# Patient Record
Sex: Female | Born: 1960 | Race: White | Hispanic: No | Smoking: Never smoker
Health system: Southern US, Community
[De-identification: ages and names within clinical notes are randomized; demographics above are authoritative.]

## PROBLEM LIST (undated history)

## (undated) DIAGNOSIS — Z8669 Personal history of other diseases of the nervous system and sense organs: Secondary | ICD-10-CM

## (undated) DIAGNOSIS — M549 Dorsalgia, unspecified: Secondary | ICD-10-CM

## (undated) DIAGNOSIS — R51 Headache: Secondary | ICD-10-CM

## (undated) DIAGNOSIS — K219 Gastro-esophageal reflux disease without esophagitis: Secondary | ICD-10-CM

## (undated) DIAGNOSIS — J45909 Unspecified asthma, uncomplicated: Secondary | ICD-10-CM

## (undated) DIAGNOSIS — E539 Vitamin B deficiency, unspecified: Secondary | ICD-10-CM

## (undated) DIAGNOSIS — D509 Iron deficiency anemia, unspecified: Secondary | ICD-10-CM

## (undated) DIAGNOSIS — T7840XA Allergy, unspecified, initial encounter: Secondary | ICD-10-CM

## (undated) DIAGNOSIS — D3A020 Benign carcinoid tumor of the appendix: Secondary | ICD-10-CM

## (undated) DIAGNOSIS — T8859XA Other complications of anesthesia, initial encounter: Secondary | ICD-10-CM

## (undated) DIAGNOSIS — M1990 Unspecified osteoarthritis, unspecified site: Secondary | ICD-10-CM

## (undated) DIAGNOSIS — I10 Essential (primary) hypertension: Secondary | ICD-10-CM

## (undated) DIAGNOSIS — T4145XA Adverse effect of unspecified anesthetic, initial encounter: Secondary | ICD-10-CM

## (undated) DIAGNOSIS — K589 Irritable bowel syndrome without diarrhea: Secondary | ICD-10-CM

## (undated) DIAGNOSIS — F419 Anxiety disorder, unspecified: Secondary | ICD-10-CM

## (undated) DIAGNOSIS — G4700 Insomnia, unspecified: Secondary | ICD-10-CM

## (undated) DIAGNOSIS — Z5189 Encounter for other specified aftercare: Secondary | ICD-10-CM

## (undated) DIAGNOSIS — IMO0002 Reserved for concepts with insufficient information to code with codable children: Secondary | ICD-10-CM

## (undated) DIAGNOSIS — F32A Depression, unspecified: Secondary | ICD-10-CM

## (undated) DIAGNOSIS — J449 Chronic obstructive pulmonary disease, unspecified: Secondary | ICD-10-CM

## (undated) DIAGNOSIS — F329 Major depressive disorder, single episode, unspecified: Secondary | ICD-10-CM

## (undated) DIAGNOSIS — Z87442 Personal history of urinary calculi: Secondary | ICD-10-CM

## (undated) DIAGNOSIS — C801 Malignant (primary) neoplasm, unspecified: Secondary | ICD-10-CM

## (undated) DIAGNOSIS — I499 Cardiac arrhythmia, unspecified: Secondary | ICD-10-CM

## (undated) DIAGNOSIS — R519 Headache, unspecified: Secondary | ICD-10-CM

## (undated) HISTORY — PX: SMALL INTESTINE SURGERY: SHX150

## (undated) HISTORY — DX: Dorsalgia, unspecified: M54.9

## (undated) HISTORY — PX: TONSILLECTOMY AND ADENOIDECTOMY: SUR1326

## (undated) HISTORY — PX: JOINT REPLACEMENT: SHX530

## (undated) HISTORY — DX: Malignant (primary) neoplasm, unspecified: C80.1

## (undated) HISTORY — DX: Reserved for concepts with insufficient information to code with codable children: IMO0002

## (undated) HISTORY — PX: CHOLECYSTECTOMY: SHX55

## (undated) HISTORY — PX: RIGHT OOPHORECTOMY: SHX2359

## (undated) HISTORY — DX: Benign carcinoid tumor of the appendix: D3A.020

## (undated) HISTORY — PX: OTHER SURGICAL HISTORY: SHX169

## (undated) HISTORY — DX: Essential (primary) hypertension: I10

## (undated) HISTORY — PX: COLON SURGERY: SHX602

## (undated) HISTORY — PX: ABDOMINAL HYSTERECTOMY: SHX81

## (undated) HISTORY — DX: Unspecified asthma, uncomplicated: J45.909

## (undated) HISTORY — DX: Gastro-esophageal reflux disease without esophagitis: K21.9

## (undated) HISTORY — DX: Chronic obstructive pulmonary disease, unspecified: J44.9

## (undated) HISTORY — PX: HERNIA REPAIR: SHX51

## (undated) HISTORY — DX: Personal history of other diseases of the nervous system and sense organs: Z86.69

## (undated) HISTORY — DX: Iron deficiency anemia, unspecified: D50.9

## (undated) HISTORY — DX: Allergy, unspecified, initial encounter: T78.40XA

## (undated) HISTORY — PX: CYSTOSCOPY: SUR368

## (undated) HISTORY — PX: BREAST BIOPSY: SHX20

## (undated) HISTORY — DX: Encounter for other specified aftercare: Z51.89

## (undated) HISTORY — DX: Vitamin B deficiency, unspecified: E53.9

---

## 1999-11-07 HISTORY — PX: APPENDECTOMY: SHX54

## 1999-11-07 HISTORY — PX: GASTRIC BYPASS: SHX52

## 2004-11-06 HISTORY — PX: REPLACEMENT TOTAL KNEE: SUR1224

## 2006-06-25 ENCOUNTER — Ambulatory Visit (HOSPITAL_COMMUNITY): Payer: Self-pay | Admitting: Psychology

## 2006-07-10 ENCOUNTER — Ambulatory Visit (HOSPITAL_COMMUNITY): Payer: Self-pay | Admitting: Psychology

## 2006-11-02 ENCOUNTER — Ambulatory Visit: Payer: Self-pay | Admitting: Oncology

## 2006-11-06 HISTORY — PX: COLONOSCOPY WITH ESOPHAGOGASTRODUODENOSCOPY (EGD): SHX5779

## 2006-12-03 LAB — IRON AND TIBC
%SAT: 4 % — ABNORMAL LOW (ref 20–55)
Iron: 18 ug/dL — ABNORMAL LOW (ref 42–145)

## 2006-12-03 LAB — CBC & DIFF AND RETIC
BASO%: 0.4 % (ref 0.0–2.0)
HCT: 31.9 % — ABNORMAL LOW (ref 34.8–46.6)
IRF: 0.46 — ABNORMAL HIGH (ref 0.130–0.330)
MCHC: 31.8 g/dL — ABNORMAL LOW (ref 32.0–36.0)
MONO#: 0.6 10*3/uL (ref 0.1–0.9)
NEUT#: 4.2 10*3/uL (ref 1.5–6.5)
NEUT%: 66.6 % (ref 39.6–76.8)
Retic %: 1.6 % (ref 0.4–2.3)
WBC: 6.3 10*3/uL (ref 3.9–10.0)
lymph#: 1.5 10*3/uL (ref 0.9–3.3)

## 2006-12-03 LAB — CHCC SMEAR

## 2006-12-03 LAB — MORPHOLOGY: RBC Comments: NORMAL

## 2006-12-21 LAB — VMA, URINE, 24 HOUR: Volume, Urine-VMAUR: 2050 mL

## 2006-12-24 LAB — 5 HIAA, QUANTITATIVE, URINE, 24 HOUR

## 2007-02-04 ENCOUNTER — Ambulatory Visit: Payer: Self-pay | Admitting: Oncology

## 2007-02-13 LAB — CBC WITH DIFFERENTIAL/PLATELET
Basophils Absolute: 0.1 10*3/uL (ref 0.0–0.1)
EOS%: 1.9 % (ref 0.0–7.0)
HCT: 34.5 % — ABNORMAL LOW (ref 34.8–46.6)
HGB: 10.7 g/dL — ABNORMAL LOW (ref 11.6–15.9)
MCH: 25.2 pg — ABNORMAL LOW (ref 26.0–34.0)
MONO#: 0.6 10*3/uL (ref 0.1–0.9)
NEUT%: 60.5 % (ref 39.6–76.8)
lymph#: 1.6 10*3/uL (ref 0.9–3.3)

## 2007-02-18 ENCOUNTER — Ambulatory Visit (HOSPITAL_COMMUNITY): Admission: RE | Admit: 2007-02-18 | Discharge: 2007-02-18 | Payer: Self-pay | Admitting: Psychiatry

## 2007-04-02 ENCOUNTER — Ambulatory Visit: Payer: Self-pay | Admitting: Oncology

## 2007-04-18 LAB — IRON AND TIBC
%SAT: 29 % (ref 20–55)
TIBC: 327 ug/dL (ref 250–470)

## 2007-04-18 LAB — CBC & DIFF AND RETIC
Basophils Absolute: 0 10*3/uL (ref 0.0–0.1)
EOS%: 1.8 % (ref 0.0–7.0)
HCT: 40.5 % (ref 34.8–46.6)
HGB: 13.8 g/dL (ref 11.6–15.9)
LYMPH%: 24.5 % (ref 14.0–48.0)
MCH: 30.6 pg (ref 26.0–34.0)
MCV: 89.8 fL (ref 81.0–101.0)
MONO%: 7.6 % (ref 0.0–13.0)
NEUT%: 65.8 % (ref 39.6–76.8)
Platelets: 407 10*3/uL — ABNORMAL HIGH (ref 145–400)

## 2007-06-21 ENCOUNTER — Ambulatory Visit: Payer: Self-pay | Admitting: Oncology

## 2007-08-19 ENCOUNTER — Ambulatory Visit: Payer: Self-pay | Admitting: Oncology

## 2007-08-21 LAB — CBC WITH DIFFERENTIAL/PLATELET
BASO%: 0.3 % (ref 0.0–2.0)
Eosinophils Absolute: 0.1 10*3/uL (ref 0.0–0.5)
MCHC: 34.7 g/dL (ref 32.0–36.0)
MONO#: 0.5 10*3/uL (ref 0.1–0.9)
MONO%: 8 % (ref 0.0–13.0)
NEUT#: 4.3 10*3/uL (ref 1.5–6.5)
RBC: 4.22 10*6/uL (ref 3.70–5.32)
RDW: 13.8 % (ref 11.3–14.5)
WBC: 6.4 10*3/uL (ref 3.9–10.0)

## 2007-08-22 LAB — COMPREHENSIVE METABOLIC PANEL
ALT: 19 U/L (ref 0–35)
AST: 15 U/L (ref 0–37)
Albumin: 4.2 g/dL (ref 3.5–5.2)
Alkaline Phosphatase: 102 U/L (ref 39–117)
BUN: 11 mg/dL (ref 6–23)
CO2: 25 mEq/L (ref 19–32)
Calcium: 9.6 mg/dL (ref 8.4–10.5)
Chloride: 104 mEq/L (ref 96–112)
Creatinine, Ser: 0.64 mg/dL (ref 0.40–1.20)
Glucose, Bld: 91 mg/dL (ref 70–99)
Potassium: 4.4 mEq/L (ref 3.5–5.3)
Sodium: 139 mEq/L (ref 135–145)
Total Bilirubin: 1.1 mg/dL (ref 0.3–1.2)
Total Protein: 7 g/dL (ref 6.0–8.3)

## 2007-08-22 LAB — IRON AND TIBC
%SAT: 41 % (ref 20–55)
Iron: 140 ug/dL (ref 42–145)

## 2007-08-22 LAB — LACTATE DEHYDROGENASE: LDH: 162 U/L (ref 94–250)

## 2007-08-22 LAB — FOLATE: Folate: 4.6 ng/mL

## 2007-10-22 ENCOUNTER — Encounter (INDEPENDENT_AMBULATORY_CARE_PROVIDER_SITE_OTHER): Payer: Self-pay | Admitting: Gastroenterology

## 2007-10-22 ENCOUNTER — Ambulatory Visit (HOSPITAL_COMMUNITY): Admission: RE | Admit: 2007-10-22 | Discharge: 2007-10-22 | Payer: Self-pay | Admitting: Gastroenterology

## 2007-12-13 ENCOUNTER — Ambulatory Visit: Payer: Self-pay | Admitting: Oncology

## 2007-12-17 ENCOUNTER — Other Ambulatory Visit: Admission: RE | Admit: 2007-12-17 | Discharge: 2007-12-17 | Payer: Self-pay | Admitting: Diagnostic Radiology

## 2007-12-17 ENCOUNTER — Encounter (INDEPENDENT_AMBULATORY_CARE_PROVIDER_SITE_OTHER): Payer: Self-pay | Admitting: Diagnostic Radiology

## 2007-12-17 ENCOUNTER — Encounter: Admission: RE | Admit: 2007-12-17 | Discharge: 2007-12-17 | Payer: Self-pay | Admitting: General Surgery

## 2007-12-30 LAB — CBC WITH DIFFERENTIAL/PLATELET
Basophils Absolute: 0 10*3/uL (ref 0.0–0.1)
Eosinophils Absolute: 0 10*3/uL (ref 0.0–0.5)
HCT: 41 % (ref 34.8–46.6)
HGB: 14.2 g/dL (ref 11.6–15.9)
LYMPH%: 18.3 % (ref 14.0–48.0)
MCV: 95.8 fL (ref 81.0–101.0)
MONO%: 7.6 % (ref 0.0–13.0)
NEUT#: 5.4 10*3/uL (ref 1.5–6.5)
NEUT%: 73 % (ref 39.6–76.8)
Platelets: 303 10*3/uL (ref 145–400)
RDW: 14.4 % (ref 11.3–14.5)

## 2007-12-30 LAB — IRON AND TIBC: UIBC: 197 ug/dL

## 2007-12-30 LAB — FERRITIN: Ferritin: 162 ng/mL (ref 10–291)

## 2008-04-10 ENCOUNTER — Ambulatory Visit: Payer: Self-pay | Admitting: Oncology

## 2008-06-21 ENCOUNTER — Ambulatory Visit: Payer: Self-pay | Admitting: Oncology

## 2008-06-22 LAB — CBC & DIFF AND RETIC
BASO%: 0.3 % (ref 0.0–2.0)
Basophils Absolute: 0 10*3/uL (ref 0.0–0.1)
HCT: 40.4 % (ref 34.8–46.6)
HGB: 13.7 g/dL (ref 11.6–15.9)
IRF: 0.45 — ABNORMAL HIGH (ref 0.130–0.330)
LYMPH%: 17.2 % (ref 14.0–48.0)
MCHC: 33.9 g/dL (ref 32.0–36.0)
MONO#: 0.6 10*3/uL (ref 0.1–0.9)
NEUT%: 71.4 % (ref 39.6–76.8)
Platelets: 292 10*3/uL (ref 145–400)
WBC: 6.8 10*3/uL (ref 3.9–10.0)

## 2008-06-22 LAB — IRON AND TIBC
%SAT: 24 % (ref 20–55)
Iron: 86 ug/dL (ref 42–145)
TIBC: 354 ug/dL (ref 250–470)
UIBC: 268 ug/dL

## 2008-06-22 LAB — FERRITIN: Ferritin: 83 ng/mL (ref 10–291)

## 2008-08-13 ENCOUNTER — Ambulatory Visit: Payer: Self-pay | Admitting: Oncology

## 2008-08-17 LAB — COMPREHENSIVE METABOLIC PANEL
Albumin: 4.2 g/dL (ref 3.5–5.2)
CO2: 23 mEq/L (ref 19–32)
Calcium: 9.5 mg/dL (ref 8.4–10.5)
Glucose, Bld: 163 mg/dL — ABNORMAL HIGH (ref 70–99)
Sodium: 140 mEq/L (ref 135–145)
Total Bilirubin: 1 mg/dL (ref 0.3–1.2)
Total Protein: 6.8 g/dL (ref 6.0–8.3)

## 2008-08-17 LAB — CBC & DIFF AND RETIC
BASO%: 0.3 % (ref 0.0–2.0)
HCT: 41.8 % (ref 34.8–46.6)
LYMPH%: 18.9 % (ref 14.0–48.0)
MCH: 33.1 pg (ref 26.0–34.0)
MCHC: 34.3 g/dL (ref 32.0–36.0)
MONO#: 0.4 10*3/uL (ref 0.1–0.9)
NEUT%: 73.4 % (ref 39.6–76.8)
Platelets: 306 10*3/uL (ref 145–400)
WBC: 7.7 10*3/uL (ref 3.9–10.0)

## 2008-08-17 LAB — LACTATE DEHYDROGENASE: LDH: 177 U/L (ref 94–250)

## 2008-08-17 LAB — IRON AND TIBC
TIBC: 370 ug/dL (ref 250–470)
UIBC: 219 ug/dL

## 2008-11-16 ENCOUNTER — Ambulatory Visit: Payer: Self-pay | Admitting: Oncology

## 2008-11-18 LAB — CBC WITH DIFFERENTIAL/PLATELET
BASO%: 0.4 % (ref 0.0–2.0)
HCT: 41.3 % (ref 34.8–46.6)
MCHC: 33.6 g/dL (ref 32.0–36.0)
MONO#: 0.6 10*3/uL (ref 0.1–0.9)
RBC: 4.24 10*6/uL (ref 3.70–5.32)
WBC: 7.6 10*3/uL (ref 3.9–10.0)
lymph#: 1.5 10*3/uL (ref 0.9–3.3)

## 2008-11-18 LAB — FERRITIN: Ferritin: 38 ng/mL (ref 10–291)

## 2008-12-24 ENCOUNTER — Encounter: Admission: RE | Admit: 2008-12-24 | Discharge: 2008-12-24 | Payer: Self-pay | Admitting: Family Medicine

## 2009-02-04 ENCOUNTER — Ambulatory Visit: Payer: Self-pay | Admitting: Oncology

## 2009-02-11 LAB — CBC & DIFF AND RETIC
BASO%: 0 % (ref 0.0–2.0)
EOS%: 1.5 % (ref 0.0–7.0)
HCT: 39.5 % (ref 34.8–46.6)
IRF: 0.33 (ref 0.130–0.330)
MCH: 32.9 pg (ref 25.1–34.0)
MCHC: 33.8 g/dL (ref 31.5–36.0)
NEUT%: 70.1 % (ref 38.4–76.8)
lymph#: 1.3 10*3/uL (ref 0.9–3.3)

## 2009-02-16 LAB — IRON AND TIBC: %SAT: 28 % (ref 20–55)

## 2009-02-16 LAB — CHROMOGRANIN A: Chromogranin A: <1.5 ng/mL (ref <=36.4)

## 2009-02-16 LAB — FERRITIN: Ferritin: 48 ng/mL (ref 10–291)

## 2009-05-14 ENCOUNTER — Ambulatory Visit: Payer: Self-pay | Admitting: Oncology

## 2009-05-18 LAB — CBC WITH DIFFERENTIAL/PLATELET
BASO%: 0.3 % (ref 0.0–2.0)
Eosinophils Absolute: 0.2 10*3/uL (ref 0.0–0.5)
HCT: 40.3 % (ref 34.8–46.6)
LYMPH%: 22 % (ref 14.0–49.7)
MCHC: 33.4 g/dL (ref 31.5–36.0)
MCV: 98 fL (ref 79.5–101.0)
MONO%: 7.5 % (ref 0.0–14.0)
NEUT%: 67.7 % (ref 38.4–76.8)
Platelets: 256 10*3/uL (ref 145–400)
RBC: 4.11 10*6/uL (ref 3.70–5.45)

## 2009-05-28 LAB — FERRITIN: Ferritin: 32 ng/mL (ref 10–291)

## 2009-09-20 ENCOUNTER — Ambulatory Visit: Payer: Self-pay | Admitting: Oncology

## 2009-09-22 LAB — CBC & DIFF AND RETIC
Basophils Absolute: 0 10*3/uL (ref 0.0–0.1)
Eosinophils Absolute: 0.1 10*3/uL (ref 0.0–0.5)
HCT: 42 % (ref 34.8–46.6)
HGB: 13.8 g/dL (ref 11.6–15.9)
LYMPH%: 20.7 % (ref 14.0–49.7)
MCHC: 32.9 g/dL (ref 31.5–36.0)
MONO#: 0.6 10*3/uL (ref 0.1–0.9)
NEUT#: 4.9 10*3/uL (ref 1.5–6.5)
NEUT%: 69.5 % (ref 38.4–76.8)
Platelets: 282 10*3/uL (ref 145–400)
WBC: 7.1 10*3/uL (ref 3.9–10.3)

## 2009-09-22 LAB — IRON AND TIBC
%SAT: 27 % (ref 20–55)
Iron: 102 ug/dL (ref 42–145)
TIBC: 376 ug/dL (ref 250–470)
UIBC: 274 ug/dL

## 2009-09-22 LAB — FERRITIN: Ferritin: 51 ng/mL (ref 10–291)

## 2009-11-01 ENCOUNTER — Emergency Department (HOSPITAL_COMMUNITY): Admission: EM | Admit: 2009-11-01 | Discharge: 2009-11-01 | Payer: Self-pay | Admitting: Emergency Medicine

## 2009-11-16 ENCOUNTER — Ambulatory Visit: Payer: Self-pay | Admitting: Oncology

## 2010-02-04 ENCOUNTER — Ambulatory Visit: Payer: Self-pay | Admitting: Oncology

## 2010-02-10 ENCOUNTER — Emergency Department (HOSPITAL_COMMUNITY): Admission: EM | Admit: 2010-02-10 | Discharge: 2010-02-10 | Payer: Self-pay | Admitting: Emergency Medicine

## 2010-11-27 ENCOUNTER — Encounter: Payer: Self-pay | Admitting: Family Medicine

## 2010-12-23 ENCOUNTER — Other Ambulatory Visit: Payer: Self-pay | Admitting: Family Medicine

## 2010-12-23 DIAGNOSIS — Z1231 Encounter for screening mammogram for malignant neoplasm of breast: Secondary | ICD-10-CM

## 2010-12-29 ENCOUNTER — Ambulatory Visit
Admission: RE | Admit: 2010-12-29 | Discharge: 2010-12-29 | Disposition: A | Discharge status: Home / Self Care | Payer: Managed Care, Other (non HMO) | Source: Ambulatory Visit | Attending: Family Medicine | Admitting: Family Medicine

## 2010-12-29 DIAGNOSIS — Z1231 Encounter for screening mammogram for malignant neoplasm of breast: Secondary | ICD-10-CM

## 2011-01-25 LAB — DIFFERENTIAL
Basophils Relative: 1 % (ref 0–1)
Monocytes Relative: 7 % (ref 3–12)
Neutro Abs: 5.3 10*3/uL (ref 1.7–7.7)
Neutrophils Relative %: 68 % (ref 43–77)

## 2011-01-25 LAB — BASIC METABOLIC PANEL
Calcium: 8.9 mg/dL (ref 8.4–10.5)
GFR calc non Af Amer: >60 mL/min (ref >60)
Glucose, Bld: 115 mg/dL — ABNORMAL HIGH (ref 70–99)
Sodium: 135 mEq/L (ref 135–145)

## 2011-01-25 LAB — URINALYSIS, ROUTINE W REFLEX MICROSCOPIC
Protein, ur: NEGATIVE mg/dL
Urobilinogen, UA: 0.2 mg/dL (ref 0.0–1.0)

## 2011-01-25 LAB — POCT CARDIAC MARKERS
CKMB, poc: <1 ng/mL — ABNORMAL LOW (ref 1.0–8.0)
Myoglobin, poc: 29.8 ng/mL (ref 12–200)
Myoglobin, poc: 45.6 ng/mL (ref 12–200)

## 2011-01-25 LAB — CBC
MCHC: 34.8 g/dL (ref 30.0–36.0)
RBC: 4.09 MIL/uL (ref 3.87–5.11)
WBC: 7.7 10*3/uL (ref 4.0–10.5)

## 2011-03-21 NOTE — Op Note (Signed)
Meghan Macdonald, Meghan Macdonald                  ACCOUNT NO.:  [id number]   MEDICAL RECORD NO.:  [id number]          PATIENT TYPE:  AMB   LOCATION:  ENDO                         FACILITY:  [facility]   PHYSICIAN:  Anselmo Rod, M.D.  DATE OF BIRTH:  16-Oct-1961   DATE OF PROCEDURE:  10/22/2007  DATE OF DISCHARGE:                               OPERATIVE REPORT   PROCEDURE PERFORMED:  Colonoscopy with multiple cold biopsies.   ENDOSCOPIST:  Anselmo Rod, M.D.   INSTRUMENT USED:  Pentax video colonoscope.   INDICATIONS FOR PROCEDURE:  A 50 year old white female with a history of  diarrhea.  The patient had a right hemicolectomy for an appendiceal  carcinoid in 2001.  Rule out colonic polyps, masses, colitis, etc.   PREPROCEDURE PREPARATION:  Informed consent was procured from the  patient.  The patient fasted for four hours prior to the procedure and  prepped with 20 OsmoPrep the night of and 12 OsmoPrep on the morning of  the procedure.  Risks and benefits of the procedure were discussed with  the patient.  A 10% miss rate of cancer and polyp were discussed with  the patient as well.   PREPROCEDURE PHYSICAL:  VITAL SIGNS:  The patient had stable vital  signs.  NECK:  Supple.  CHEST:  Clear to auscultation.  CARDIOVASCULAR:  S1, S2 regular.  ABDOMEN:  Soft with normal bowel sounds.   DESCRIPTION OF PROCEDURE:  The patient was placed in the left lateral  decubitus position and sedated with an additional 2 mg of Versed given  intravenously in slow incremental doses. Once the patient was adequately  sedated and maintained on low-flow oxygen and continuous cardiac  monitoring, the Pentax video colonoscope was advanced from the rectum to  the anastomosis without difficulty.  The patient had fairly good prep.  Sigmoid diverticulosis was noted.  The patient had a healthy  anastomosis.  There was no stricturing at the anastomosis.  The small  bowel appeared normal distally.  Patchy erythema was  present in the left  colon.  Biopsies were done to rule out colitis.  Retroflexion in the  rectum revealed no abnormalities.  The patient tolerated the procedure  well without complications.  No masses or polyps were seen.   IMPRESSION:  1. Sigmoid diverticulosis.  2. Patient status post right hemicolectomy.  Distal small bowel      appears normal.  3. Patchy erythema in the left colon.  Biopsies done, results pending.  4. No masses or polyps seen.   RECOMMENDATIONS:  1. Await pathology results.  2. Avoid all nonsteroidals including aspirin for the next four weeks.  3. Increase fluid and fiber in the diet.  4. Outpatient follow-up in the next four weeks for further      recommendations.      Anselmo Rod, M.D.  Electronically Signed     JNM/MEDQ  D:  10/22/2007  T:  10/22/2007  Job:  [id number]   cc:   Genene Churn. Cyndie Chime, M.D.  Fax: [phone]   Chales Salmon. Abigail Miyamoto, M.D.  Fax: [phone]

## 2011-03-21 NOTE — Op Note (Signed)
Meghan Macdonald, Meghan Macdonald                  ACCOUNT NO.:  [id number]   MEDICAL RECORD NO.:  [id number]          PATIENT TYPE:  AMB   LOCATION:  ENDO                         FACILITY:  [facility]   PHYSICIAN:  Anselmo Rod, M.D.  DATE OF BIRTH:  28-Jun-1961   DATE OF PROCEDURE:  10/22/2007  DATE OF DISCHARGE:                               OPERATIVE REPORT   PROCEDURE PERFORMED:  Esophagogastroduodenoscopy with esophageal  biopsies x 3.   ENDOSCOPIST:  Anselmo Rod, M.D.   INSTRUMENT USED:  Pentax video panendoscope.   INDICATIONS FOR PROCEDURE:  A 50 year old white female with a history of  Barrett's diagnosed in 1996 undergoing EGD for surveillance purposes.  The patient has not had EGD since then and presented to me for the first  time earlier this year.  EGD is being performed to further evaluate the  esophagus for dysplasia.  The patient has had a gastric bypass in the  past and has iron deficiency and B-12 deficiency.   PREPROCEDURE PREPARATION:  Informed consent was procured from the  patient.  The patient fasted for four hours prior to the procedure. The  risks and benefits of the procedure were discussed with the patient in  detail.   PREPROCEDURE PHYSICAL:  The patient had stable vital signs. Neck supple.  Chest clear to auscultation. Abdomen soft with normal bowel sounds.   DESCRIPTION OF PROCEDURE:  The patient was placed in the left lateral  decubitus position and sedated with 100 mcg of Fentanyl and 8 mg of  Versed given intravenously in slow incremental doses. Once the patient  was adequately sedated and maintained on low-flow oxygen and continuous  cardiac monitoring the Pentax video panendoscope was advanced through  the mouthpiece over the tongue into the esophagus under direct vision.  The proximal, the mid and the distal esophagus appeared normal. The Z-  line appeared healthy. Small tongues of pinkish mucosa was biopsied even  though this does not seem typical of  Barrett's to rule out dysplasia.  I  did not see any islands or patches of  what appeared to be Barrett's  mucosa. The scope was then advanced in the stomach.  The patient has a  very small gastric pouch measuring less than 5 cm. Postsurgical changes  were noted. The small bowel lumen appeared patent and was without  ulcerations or masses. The patient tolerated the procedure well without  complications.  Retroflexion was not done as it was not possible in the  small gastric pouch.   IMPRESSION:  1. Healthy-appearing Z-line, healthy-appearing esophagus.  See      biopsies done from the distal  Esophagus.  1. Status post gastric bypass postsurgical changes noted in the      stomach and proximal small bowel.  2. No ulcerations, masses or polyps seen.  No other evidence of      pathology at the anastomosis.   RECOMMENDATIONS:  1. Await pathology results.  2. Avoid all nonsteroidals.  3. Proceed with a colonoscopy at this time.  4. Further recommendations to be made thereafter.  Anselmo Rod, M.D.  Electronically Signed     JNM/MEDQ  D:  10/22/2007  T:  10/22/2007  Job:  [id number]   cc:   Genene Churn. Cyndie Chime, M.D.  Fax: [phone]   Chales Salmon. Abigail Miyamoto, M.D.  Fax: [phone]

## 2012-05-01 ENCOUNTER — Other Ambulatory Visit: Payer: Self-pay | Admitting: Family Medicine

## 2012-05-01 DIAGNOSIS — Z1231 Encounter for screening mammogram for malignant neoplasm of breast: Secondary | ICD-10-CM

## 2012-05-14 ENCOUNTER — Ambulatory Visit
Admission: RE | Admit: 2012-05-14 | Discharge: 2012-05-14 | Disposition: A | Discharge status: Home / Self Care | Payer: Managed Care, Other (non HMO) | Source: Ambulatory Visit | Attending: Family Medicine | Admitting: Family Medicine

## 2012-05-14 DIAGNOSIS — Z1231 Encounter for screening mammogram for malignant neoplasm of breast: Secondary | ICD-10-CM

## 2012-08-04 ENCOUNTER — Emergency Department (HOSPITAL_COMMUNITY): Payer: Managed Care, Other (non HMO)

## 2012-08-04 ENCOUNTER — Emergency Department (HOSPITAL_COMMUNITY)
Admission: EM | Admit: 2012-08-04 | Discharge: 2012-08-04 | Disposition: A | Discharge status: Home / Self Care | Payer: Managed Care, Other (non HMO) | Attending: Emergency Medicine | Admitting: Emergency Medicine

## 2012-08-04 ENCOUNTER — Encounter (HOSPITAL_COMMUNITY): Payer: Self-pay | Admitting: Emergency Medicine

## 2012-08-04 DIAGNOSIS — F411 Generalized anxiety disorder: Secondary | ICD-10-CM | POA: Insufficient documentation

## 2012-08-04 DIAGNOSIS — Z825 Family history of asthma and other chronic lower respiratory diseases: Secondary | ICD-10-CM | POA: Insufficient documentation

## 2012-08-04 DIAGNOSIS — Z8489 Family history of other specified conditions: Secondary | ICD-10-CM | POA: Insufficient documentation

## 2012-08-04 DIAGNOSIS — I10 Essential (primary) hypertension: Secondary | ICD-10-CM | POA: Insufficient documentation

## 2012-08-04 DIAGNOSIS — F3289 Other specified depressive episodes: Secondary | ICD-10-CM | POA: Insufficient documentation

## 2012-08-04 DIAGNOSIS — S82899A Other fracture of unspecified lower leg, initial encounter for closed fracture: Secondary | ICD-10-CM | POA: Insufficient documentation

## 2012-08-04 DIAGNOSIS — S93401A Sprain of unspecified ligament of right ankle, initial encounter: Secondary | ICD-10-CM

## 2012-08-04 DIAGNOSIS — S93409A Sprain of unspecified ligament of unspecified ankle, initial encounter: Secondary | ICD-10-CM | POA: Insufficient documentation

## 2012-08-04 DIAGNOSIS — F329 Major depressive disorder, single episode, unspecified: Secondary | ICD-10-CM | POA: Insufficient documentation

## 2012-08-04 DIAGNOSIS — S82839A Other fracture of upper and lower end of unspecified fibula, initial encounter for closed fracture: Secondary | ICD-10-CM

## 2012-08-04 HISTORY — DX: Major depressive disorder, single episode, unspecified: F32.9

## 2012-08-04 HISTORY — DX: Anxiety disorder, unspecified: F41.9

## 2012-08-04 HISTORY — DX: Insomnia, unspecified: G47.00

## 2012-08-04 HISTORY — DX: Depression, unspecified: F32.A

## 2012-08-04 MED ORDER — OXYCODONE-ACETAMINOPHEN 5-325 MG PO TABS: 1.0000 | ORAL_TABLET | ORAL | Status: AC | PRN

## 2012-08-04 NOTE — ED Notes (Signed)
Patient with no complaints at this time. Respirations even and unlabored. Skin warm/dry. Discharge instructions reviewed with patient at this time. Patient given opportunity to voice concerns/ask questions. Patient discharged at this time and left Emergency Department with steady gait.   

## 2012-08-04 NOTE — ED Notes (Signed)
Pt stated she stepped in a hole and twisted both ankles.

## 2012-08-04 NOTE — ED Provider Notes (Signed)
History     CSN: [id number]  Arrival date & time 08/04/12  1222   First MD Initiated Contact with Patient 08/04/12 1349      Chief Complaint  Patient presents with  . Ankle Pain    (Consider location/radiation/quality/duration/timing/severity/associated sxs/prior treatment) HPI Comments: Patient complains of pain to her bilateral ankles. Pain began on the day prior to ED arrival after stepping in a hole in her regard. She states that she she stepped in a hole with her right foot and and fell twisting the left ankle. Reports pain greater on the left than the right. She denies proximal tenderness, numbness, motor weakness, back or hip pain. Pain is worse with weightbearing and improves slightly with rest. She has not tried any medication for the pain. She denies other injuries, neck pain, head injury or LOC.  Patient is a 51 y.o. female presenting with ankle pain. The history is provided by the patient.  Ankle Pain  The incident occurred yesterday. The incident occurred at home. The injury mechanism was torsion and a fall. The pain is present in the left ankle and right ankle. The quality of the pain is described as aching and throbbing. The pain is moderate. The pain has been constant since onset. Associated symptoms include inability to bear weight. Pertinent negatives include no numbness, no loss of motion, no muscle weakness, no loss of sensation and no tingling. She reports no foreign bodies present. The symptoms are aggravated by activity, bearing weight and palpation. She has tried nothing for the symptoms. The treatment provided no relief.    Past Medical History  Diagnosis Date  . Palpitations   . Depression   . Anxiety   . Insomnia   . Hypertension     Past Surgical History  Procedure Date  . Kidney stone sx   . Gastric bypass   . Appendectomy   . Replacement total knee   . Tonsillectomy     Family History  Problem Relation Age of Onset  . Asthma Mother   .  Hyperlipidemia Mother   . Thyroid disease Mother   . Hyperlipidemia Father     History  Substance Use Topics  . Smoking status: Never Smoker   . Smokeless tobacco: Never Used  . Alcohol Use: 2.4 oz/week    4 Glasses of wine per week     drinks five days a week    OB History    Grav Para Term Preterm Abortions TAB SAB Ect Mult Living                  Review of Systems  Constitutional: Negative for fever and chills.  HENT: Negative for neck pain.   Eyes: Negative for visual disturbance.  Genitourinary: Negative for dysuria and difficulty urinating.  Musculoskeletal: Positive for joint swelling, arthralgias and gait problem. Negative for back pain.  Skin: Negative for color change and wound.  Neurological: Negative for dizziness, tingling, facial asymmetry, weakness, numbness and headaches.  All other systems reviewed and are negative.    Allergies  Review of patient's allergies indicates no known allergies.  Home Medications   Current Outpatient Rx  Name Route Sig Dispense Refill  . CETIRIZINE HCL 10 MG PO TABS Oral Take 10 mg by mouth daily.    Marland Kitchen LORAZEPAM 0.5 MG PO TABS Oral Take 0.5 mg by mouth daily as needed. Anxiety.    Marland Kitchen METOPROLOL TARTRATE 100 MG PO TABS Oral Take 100 mg by mouth daily.    Marland Kitchen  RANITIDINE HCL 150 MG PO TABS Oral Take 150 mg by mouth daily.    . SERTRALINE HCL 100 MG PO TABS Oral Take 100 mg by mouth daily.    Marland Kitchen SOLIFENACIN SUCCINATE 10 MG PO TABS Oral Take 10 mg by mouth daily.    Marland Kitchen ZOLPIDEM TARTRATE 10 MG PO TABS Oral Take 10 mg by mouth at bedtime as needed. Sleep.      BP 133/81  Pulse 78  Temp 98.7 F (37.1 C) (Oral)  Resp 20  Ht 5\' 4"  (1.626 m)  Wt 268 lb (121.564 kg)  BMI 46.00 kg/m2  SpO2 97%  Physical Exam  Nursing note and vitals reviewed. Constitutional: She is oriented to person, place, and time. She appears well-developed and well-nourished. No distress.  HENT:  Head: Normocephalic and atraumatic.  Cardiovascular: Normal  rate, regular rhythm, normal heart sounds and intact distal pulses.   Pulmonary/Chest: Effort normal and breath sounds normal.  Musculoskeletal: She exhibits tenderness.       Right ankle: She exhibits decreased range of motion, swelling and deformity. She exhibits no ecchymosis, no laceration and normal pulse. tenderness. Lateral malleolus tenderness found. No posterior TFL, no head of 5th metatarsal and no proximal fibula tenderness found. Achilles tendon normal.       Left ankle: She exhibits decreased range of motion and swelling. She exhibits no ecchymosis, no deformity, no laceration and normal pulse. tenderness. Lateral malleolus tenderness found. No posterior TFL, no head of 5th metatarsal and no proximal fibula tenderness found. Achilles tendon normal.       Tenderness to palpation of the bilateral ankles, moderate soft tissue swelling is present. Left greater than right.   No bruising.  DP pulse is brisk, sensation intact.  No erythema, abrasion, bruising or bony deformity.    Neurological: She is alert and oriented to person, place, and time. She exhibits normal muscle tone. Coordination normal.  Skin: Skin is warm and dry.    ED Course  Procedures (including critical care time)  Labs Reviewed - No data to display Dg Ankle Complete Left  08/04/2012  *RADIOLOGY REPORT*  Clinical Data: Injury, pain.  LEFT ANKLE COMPLETE - 3+ VIEW  Comparison: None.  Findings: The patient has an oblique fracture of the distal fibula which is nondisplaced.  No other acute bony or joint abnormality is identified.  Soft tissue swelling in association with the fracture is noted.  Calcaneal spur is identified.  IMPRESSION: Nondisplaced oblique fracture distal fibula with associated soft tissue swelling.   Original Report Authenticated By: Bernadene Bell. D'ALESSIO, M.D.    Dg Ankle Complete Right  08/04/2012  *RADIOLOGY REPORT*  Clinical Data: Injury, pain.  RIGHT ANKLE - COMPLETE 3+ VIEW  Comparison: None.  Findings:  There is marked soft tissue swelling about the ankle, worse on the lateral side.  No fracture, dislocation or radiopaque foreign body is identified.  Small plantar calcaneal spur and mild appearing midfoot degenerative disease are noted.  IMPRESSION: Marked soft tissue swelling without underlying fracture or dislocation.   Original Report Authenticated By: Bernadene Bell. D'ALESSIO, M.D.         MDM    Since pt has injuries to the bilateral ankles, I will place the fractured ankle in a cam walker and ASO applied to the right ankle. Pain improved. Remains neurovascularly intact.   Pt has a walker at home.  No proximal tenderness.  Pt has a left total knee replacement.  Pt agrees to close orthopedic follow-up.  The patient appears reasonably screened and/or stabilized for discharge and I doubt any other medical condition or other [facility] requiring further screening, evaluation, or treatment in the ED at this time prior to discharge.    Prescribed:  Percocet #[id number]   Randie Bloodgood L. [place], [place] 08/05/12 1816

## 2012-08-05 ENCOUNTER — Encounter: Payer: Self-pay | Admitting: Orthopedic Surgery

## 2012-08-05 ENCOUNTER — Ambulatory Visit (INDEPENDENT_AMBULATORY_CARE_PROVIDER_SITE_OTHER): Payer: Managed Care, Other (non HMO) | Admitting: Orthopedic Surgery

## 2012-08-05 VITALS — BP 90/60 | Ht 64.0 in | Wt 268.0 lb

## 2012-08-05 DIAGNOSIS — S8263XA Displaced fracture of lateral malleolus of unspecified fibula, initial encounter for closed fracture: Secondary | ICD-10-CM

## 2012-08-05 DIAGNOSIS — S93409A Sprain of unspecified ligament of unspecified ankle, initial encounter: Secondary | ICD-10-CM | POA: Insufficient documentation

## 2012-08-05 NOTE — Patient Instructions (Addendum)
OOW X 1 WEEK PENDING ABILITY TO DRIVE   WEIGHT BEARING AS TOLERATED IN BOTH ANKLE BRACE AND WALKING BOOT   Ankle Sprain An ankle sprain is an injury to the strong, fibrous tissues (ligaments) that hold the bones of your ankle joint together.   CAUSES Ankle sprain usually is caused by a fall or by twisting your ankle. People who participate in sports are more prone to these types of injuries.   SYMPTOMS   Symptoms of ankle sprain include:  Pain in your ankle. The pain may be present at rest or only when you are trying to stand or walk.   Swelling.   Bruising. Bruising may develop immediately or within 1 to 2 days after your injury.   Difficulty standing or walking.  DIAGNOSIS   Your caregiver will ask you details about your injury and perform a physical exam of your ankle to determine if you have an ankle sprain. During the physical exam, your caregiver will press and squeeze specific areas of your foot and ankle. Your caregiver will try to move your ankle in certain ways. An X-ray exam may be done to be sure a bone was not broken or a ligament did not separate from one of the bones in your ankle (avulsion).   TREATMENT   Certain types of braces can help stabilize your ankle. Your caregiver can make a recommendation for this. Your caregiver may recommend the use of medication for pain. If your sprain is severe, your caregiver may refer you to a surgeon who helps to restore function to parts of your skeletal system (orthopedist) or a physical therapist. HOME CARE INSTRUCTIONS   Apply ice to your injury for 1 to 2 days or as directed by your caregiver. Applying ice helps to reduce inflammation and pain.  Put ice in a plastic bag.   Place a towel between your skin and the bag.   Leave the ice on for 15 to 20 minutes at a time, every 2 hours while you are awake.   Take over-the-counter or prescription medicines for pain, discomfort, or fever only as directed by your caregiver.   Keep your  injured leg elevated, when possible, to lessen swelling.   If your caregiver recommends crutches, use them as instructed. Gradually, put weight on the affected ankle. Continue to use crutches or a cane until you can walk without feeling pain in your ankle.   If you have a plaster splint, wear the splint as directed by your caregiver. Do not rest it on anything harder than a pillow the first 24 hours. Do not put weight on it. Do not get it wet. You may take it off to take a shower or bath.   You may have been given an elastic bandage to wear around your ankle to provide support. If the elastic bandage is too tight (you have numbness or tingling in your foot or your foot becomes cold and blue), adjust the bandage to make it comfortable.   If you have an air splint, you may blow more air into it or let air out to make it more comfortable. You may take your splint off at night and before taking a shower or bath.   Wiggle your toes in the splint several times per day if you are able.  SEEK MEDICAL CARE IF:    You have an increase in bruising, swelling, or pain.   Your toes feel cold.   Pain relief is not achieved with medication.  SEEK IMMEDIATE MEDICAL CARE IF: Your toes are numb or blue or you have severe pain. MAKE SURE YOU:    Understand these instructions.   Will watch your condition.   Will get help right away if you are not doing well or get worse.  Document Released: 10/23/2005 Document Revised: 10/12/2011 Document Reviewed: 05/27/2008 [employer] Patient Information 2012 [place], [place].Fibular Fracture, Ankle, Adult, Undisplaced, Treated with Immobilization You have a break (fracture) of your fibula at the end of this bone which makes up part of your ankle. This is the bone in your lower leg located on the outside of the leg and it makes up the bump you feel on the outside of your ankle. These fractures are easily diagnosed with x-rays. TREATMENT   You have a simple fracture (this  means it is in good position and the bones are not displaced) of the part of the fibula that is located at the ankle. This usually will heal without disability and can often be treated with only casting or splinting depending on the nature of the break.   HOME CARE INSTRUCTIONS    Apply ice to the injury for 15 to 20 minutes, 3 to 4 times per day while awake, for 2 days. Put the ice in a plastic bag and place a thin towel between the bag of ice and your leg. This helps keep swelling down.   Use crutches as directed. Resume walking without crutches as directed by your caregiver or when comfortable doing so.   Only take over-the-counter or prescription medicines for pain, discomfort, or fever as directed by your caregiver.   Keep appointments for follow up X-rays if these are required.   If you have a removable splint or boot, do not remove the boot unless directed by your caregiver.   Warning: Do not drive a car or operate a motor vehicle until your caregiver specifically tells you it is safe to do so.  SEEK IMMEDIATE MEDICAL CARE IF:    Your cast gets damaged or breaks.   You have continued severe pain or more swelling than you did before the cast was put on, or the pain is not controlled with medications.   Your skin or nails below the injury turn blue or grey, or feel cold or numb.   There is a bad smell, or new stains and/or pus like (purulent ) drainage coming from under the cast.   You develop severe pain in ankle or foot.  MAKE SURE YOU:    Understand these instructions.   Will watch your condition.   Will get help right away if you are not doing well or get worse.  Document Released: 07/15/2002 Document Revised: 10/12/2011 Document Reviewed: 05/29/2008 [facility] Patient Information 2012 [place], [place].

## 2012-08-05 NOTE — Progress Notes (Signed)
Patient ID: Meghan Macdonald, female   DOB: 07/20/1961, 51 y.o.   MRN: [id number] Chief Complaint  Patient presents with  . Ankle Injury    left ankle fracture, DOI 08/03/12    BP 90/60  Ht 5\' 4"  (1.626 m)  Wt 268 lb (121.564 kg)  BMI 46.00 kg/m2  LEFT ankle fracture, lateral malleolus, RIGHT ankle sprain.  Date of injury September 28. The patient fell in her backyard stepping into a hole that had been dug by her dog. She went to the ER on the 28th. She had x-rays of The LEFT and the RIGHT ankle. She is on oxycodone for pain.  She is still standing symptoms of discomfort, primarily laterally with standing. Pain is 9/10 with standing. It is constant. She is currently using a walker with the Cam Walker on the LEFT in an ASO brace on the RIGHT. She has some associated swelling and ecchymosis.  Review of systems positive findings were palpitations, irritable bowel syndrome, urgency, anxiety, depression, seasonal allergy and all others were reviewed and were negative.  Past Surgical History  Procedure Date  . Kidney stone sx   . Gastric bypass   . Appendectomy   . Replacement total knee     LEFT  . Tonsillectomy and adenoidectomy   . Cystoscopy     Past Medical History  Diagnosis Date  . Palpitations   . Depression   . Anxiety   . Insomnia   . Hypertension     The patient's allergies are recorded, the medical and surgical history have been recorded, medications family history and social history have been recorded and all have been reviewed.  BP 90/60  Ht 5\' 4"  (1.626 m)  Wt 268 lb (121.564 kg)  BMI 46.00 kg/m2  Vital signs are stable as recorded  General appearance is normal  The patient is alert and oriented x3  The patient's mood and affect are normal  Gait assessment: She is ambulatory with a walker, along with a Cam Walker on the LEFT leg in a ASO on the RIGHT The cardiovascular exam reveals normal pulses and temperature without edema swelling.  The lymphatic system  is negative for palpable lymph nodes  The sensory exam is normal.  There are no pathologic reflexes.  Balance is normal.   Exam of the Upper extremities, found no major abnormalities. Inspection was normal. Range of motion was full joints. All joints are stable. Muscle tone and strength are normal and the skin was intact.  The RIGHT ankle was action more swollen than the LEFT. There is tenderness on the lateral ligaments with passive range of motion of 25. There was some instability with laxity, but there is a firm endpoint. There was weakness, which is secondary to pain and swelling. Skin intact.  LEFT ankle. Tenderness over the lateral malleolus with lateral and anterior joint swelling. Range of motion of 10. Stability. Normal strength weakness secondary to pain. Skin intact.  Hospital x-rays show a nondisplaced LEFT lateral malleolus fracture and a normal RIGHT ankle except for soft tissue swelling.  Diagnosis #1 lateral malleolus fracture.  Diagnosis #2 ankle sprain, RIGHT ankle.  Recommendations O. Brace on the RIGHT Cam Walker on the LEFT x-ray in a week.   OOW FOR 1 week until patient drive. She will call us back if she needs a note to allow her to return to work

## 2012-08-08 NOTE — ED Provider Notes (Signed)
Medical screening examination/treatment/procedure(s) were performed by non-physician practitioner and as supervising physician I was immediately available for consultation/collaboration.  Sonjia Wilcoxson, MD 08/08/12 1050 

## 2012-08-20 ENCOUNTER — Encounter: Payer: Self-pay | Admitting: Orthopedic Surgery

## 2012-08-20 ENCOUNTER — Ambulatory Visit (INDEPENDENT_AMBULATORY_CARE_PROVIDER_SITE_OTHER): Payer: Managed Care, Other (non HMO)

## 2012-08-20 ENCOUNTER — Ambulatory Visit (INDEPENDENT_AMBULATORY_CARE_PROVIDER_SITE_OTHER): Payer: Managed Care, Other (non HMO) | Admitting: Orthopedic Surgery

## 2012-08-20 VITALS — BP 116/80 | Ht 64.0 in | Wt 268.0 lb

## 2012-08-20 DIAGNOSIS — M25579 Pain in unspecified ankle and joints of unspecified foot: Secondary | ICD-10-CM

## 2012-08-20 DIAGNOSIS — M25572 Pain in left ankle and joints of left foot: Secondary | ICD-10-CM

## 2012-08-20 NOTE — Patient Instructions (Addendum)
Continue boot weight bearing as tolerated use walker as needed

## 2012-08-20 NOTE — Progress Notes (Signed)
Patient ID: Meghan Macdonald, female   DOB: [birth year], 51 y.o.   MRN: [id number] Chief Complaint  Patient presents with  . Follow-up    recheck left ankle sprain and malleolar fracture, DOI 08/03/12    X-ray today shows that the fracture is in the same position with no displacement or disruption of the ankle mortise  The ankle itself looks good skin is intact swelling has decreased neurovascular exam is normal  Impression ankle fracture with sprain recommend continue Cam Walker weightbearing as tolerated with support as needed with x-ray in 4 weeks

## 2012-09-17 ENCOUNTER — Ambulatory Visit (INDEPENDENT_AMBULATORY_CARE_PROVIDER_SITE_OTHER): Payer: Managed Care, Other (non HMO)

## 2012-09-17 ENCOUNTER — Ambulatory Visit (INDEPENDENT_AMBULATORY_CARE_PROVIDER_SITE_OTHER): Payer: Managed Care, Other (non HMO) | Admitting: Orthopedic Surgery

## 2012-09-17 ENCOUNTER — Encounter: Payer: Self-pay | Admitting: Orthopedic Surgery

## 2012-09-17 VITALS — Ht 64.0 in | Wt 268.0 lb

## 2012-09-17 DIAGNOSIS — S8263XA Displaced fracture of lateral malleolus of unspecified fibula, initial encounter for closed fracture: Secondary | ICD-10-CM

## 2012-09-17 DIAGNOSIS — S93409A Sprain of unspecified ligament of unspecified ankle, initial encounter: Secondary | ICD-10-CM

## 2012-09-17 DIAGNOSIS — S82899A Other fracture of unspecified lower leg, initial encounter for closed fracture: Secondary | ICD-10-CM

## 2012-09-17 NOTE — Progress Notes (Signed)
Patient ID: Meghan Macdonald, female   DOB: [birth year], 51 y.o.   MRN: [id number] Chief Complaint  Patient presents with  . Follow-up    Ankle fracture September 28 with ankle sprain   Also has right ankle sprain treated with ASO brace  Left ankle feels better than the right ankle sprain  X-rays show fracture healing nondisplaced fracture  Recommend continue Cam Walker on the left an ASO on the right  Clinical exam shows that the left fibula still tender and she has pain with rotation so we will x-ray again in 4 weeks continue Cam Walker and ASO brace.

## 2012-09-17 NOTE — Patient Instructions (Addendum)
Brace x 4 weeks  °

## 2012-10-15 ENCOUNTER — Encounter: Payer: Self-pay | Admitting: Orthopedic Surgery

## 2012-10-15 ENCOUNTER — Ambulatory Visit: Payer: Managed Care, Other (non HMO) | Admitting: Orthopedic Surgery

## 2012-10-29 ENCOUNTER — Encounter: Payer: Self-pay | Admitting: Orthopedic Surgery

## 2012-10-29 ENCOUNTER — Ambulatory Visit (INDEPENDENT_AMBULATORY_CARE_PROVIDER_SITE_OTHER): Payer: Managed Care, Other (non HMO) | Admitting: Orthopedic Surgery

## 2012-10-29 ENCOUNTER — Ambulatory Visit (INDEPENDENT_AMBULATORY_CARE_PROVIDER_SITE_OTHER): Payer: Managed Care, Other (non HMO)

## 2012-10-29 VITALS — Ht 64.0 in | Wt 268.0 lb

## 2012-10-29 DIAGNOSIS — S82899A Other fracture of unspecified lower leg, initial encounter for closed fracture: Secondary | ICD-10-CM

## 2012-10-29 NOTE — Patient Instructions (Signed)
Remove brace activities as tolerated 

## 2012-10-29 NOTE — Progress Notes (Signed)
Patient ID: Meghan Macdonald, female   DOB: Nov 07, 1960, 51 y.o.   MRN: [id number] Chief Complaint  Patient presents with  . Follow-up    4 week recheck on left ankle fracture. DOI 08-03-12.    The patient is not complaining of any pain with ambulation in the [place].  Clinical exam shows no tenderness.  X-ray shows abundant callus with an intact mortise.  Patient resume normal activities and remove the brace

## 2013-09-10 ENCOUNTER — Other Ambulatory Visit: Payer: Self-pay

## 2013-09-10 DIAGNOSIS — Z1231 Encounter for screening mammogram for malignant neoplasm of breast: Secondary | ICD-10-CM

## 2013-10-06 ENCOUNTER — Encounter: Payer: Self-pay | Admitting: Cardiology

## 2013-10-06 ENCOUNTER — Encounter: Payer: Self-pay | Admitting: *Deleted

## 2013-10-06 DIAGNOSIS — T148XXA Other injury of unspecified body region, initial encounter: Secondary | ICD-10-CM

## 2013-10-06 DIAGNOSIS — D509 Iron deficiency anemia, unspecified: Secondary | ICD-10-CM

## 2013-10-06 DIAGNOSIS — D649 Anemia, unspecified: Secondary | ICD-10-CM

## 2013-10-06 DIAGNOSIS — G43909 Migraine, unspecified, not intractable, without status migrainosus: Secondary | ICD-10-CM

## 2013-10-06 DIAGNOSIS — E538 Deficiency of other specified B group vitamins: Secondary | ICD-10-CM

## 2013-10-06 DIAGNOSIS — M542 Cervicalgia: Secondary | ICD-10-CM

## 2013-10-06 DIAGNOSIS — I10 Essential (primary) hypertension: Secondary | ICD-10-CM | POA: Insufficient documentation

## 2013-10-06 DIAGNOSIS — I498 Other specified cardiac arrhythmias: Secondary | ICD-10-CM

## 2013-10-06 DIAGNOSIS — R002 Palpitations: Secondary | ICD-10-CM | POA: Insufficient documentation

## 2013-10-06 DIAGNOSIS — F411 Generalized anxiety disorder: Secondary | ICD-10-CM

## 2013-10-06 DIAGNOSIS — C7A020 Malignant carcinoid tumor of the appendix: Secondary | ICD-10-CM

## 2013-10-06 DIAGNOSIS — L659 Nonscarring hair loss, unspecified: Secondary | ICD-10-CM

## 2013-10-06 DIAGNOSIS — F1020 Alcohol dependence, uncomplicated: Secondary | ICD-10-CM

## 2013-10-06 DIAGNOSIS — I499 Cardiac arrhythmia, unspecified: Secondary | ICD-10-CM

## 2013-10-06 DIAGNOSIS — K219 Gastro-esophageal reflux disease without esophagitis: Secondary | ICD-10-CM

## 2013-10-07 ENCOUNTER — Ambulatory Visit: Payer: Managed Care, Other (non HMO) | Admitting: Cardiology

## 2013-10-07 ENCOUNTER — Encounter: Payer: Self-pay | Admitting: Cardiovascular Disease

## 2013-10-07 ENCOUNTER — Other Ambulatory Visit: Payer: Self-pay | Admitting: *Deleted

## 2013-10-07 ENCOUNTER — Ambulatory Visit (INDEPENDENT_AMBULATORY_CARE_PROVIDER_SITE_OTHER): Payer: Managed Care, Other (non HMO) | Admitting: Cardiovascular Disease

## 2013-10-07 ENCOUNTER — Encounter: Payer: Self-pay | Admitting: *Deleted

## 2013-10-07 VITALS — BP 108/71 | HR 55 | Ht 64.0 in | Wt 272.8 lb

## 2013-10-07 DIAGNOSIS — R079 Chest pain, unspecified: Secondary | ICD-10-CM

## 2013-10-07 DIAGNOSIS — I10 Essential (primary) hypertension: Secondary | ICD-10-CM

## 2013-10-07 DIAGNOSIS — R002 Palpitations: Secondary | ICD-10-CM

## 2013-10-07 DIAGNOSIS — I498 Other specified cardiac arrhythmias: Secondary | ICD-10-CM

## 2013-10-07 DIAGNOSIS — K219 Gastro-esophageal reflux disease without esophagitis: Secondary | ICD-10-CM

## 2013-10-07 MED ORDER — METOPROLOL TARTRATE 25 MG PO TABS: 25.0000 mg | ORAL_TABLET | Freq: Every day | ORAL | Status: DC

## 2013-10-07 NOTE — Progress Notes (Signed)
Patient ID: Meghan Macdonald, female   DOB: [birth year], 52 y.o.   MRN: [id number]       CARDIOLOGY CONSULT NOTE  Patient ID: Meghan Macdonald MRN: [id number] DOB/AGE: December 15, 1960 52 y.o.  Admit date: (Not on file) Primary Physician Angelica Chessman., MD  Reason for Consultation: chest pain and palpitations  HPI: The patient is a 52 year old woman with a past medical history significant for hypertension, gastroesophageal reflux disease, carcinoid tumor, palpitations, anxiety and depression, and gastric bypass surgery. She works at [employer] and says that her job can be very stressful at times. She says she has a type A personality and has "always been anxious and wound up" since she was a teenager. She began experiencing palpitations approximately 3-1/2 years ago. She reportedly wore an event monitor and she tells me that no abnormal heart rhythms were found. She was started on metoprolol at that time. Her palpitations were controlled for years until recently when she began experiencing them more often. She had been taking long-acting metoprolol 100 mg daily she was recently prescribed an extra 25 mg to be taken in the morning as well. Approximately 6 weeks ago, she had significant heartburn. She has tried famotidine, ranitidine, Nexium, and Prilosec in the past and of all of these medications, ranitidine has been the least effective. She denies lightheadedness, dizziness and syncope. She denies leg swelling. She denies exertional chest pain. She says she has chronic dyspnea with exertion due to being out of shape but this has not progressed in intensity. She experiences palpitations more frequently when lying on her left side as well as when things are stressful at work. They can occur in different fashions, but she has noticed that she feels a "flip" in her chest and then her heart rate accelerates quickly. Sometimes she is unaware of when it begins or ends.    No Known Allergies  Current  Outpatient Prescriptions  Medication Sig Dispense Refill  . albuterol (PROVENTIL HFA;VENTOLIN HFA) 108 (90 BASE) MCG/ACT inhaler Inhale 1-2 puffs into the lungs every 6 (six) hours as needed for wheezing or shortness of breath.      . Cholecalciferol (VITAMIN D3) 1000 UNITS CAPS Take 1 capsule by mouth daily.      . Cyanocobalamin (VITAMIN B-12 IJ) Take one injection monthly      . DULoxetine (CYMBALTA) 60 MG capsule Take 60 mg by mouth daily.      . fluticasone (FLONASE) 50 MCG/ACT nasal spray Place 1 spray into both nostrils as needed for allergies or rhinitis.      Marland Kitchen LORazepam (ATIVAN) 0.5 MG tablet Take 0.5 mg by mouth daily as needed. Anxiety.      . metoprolol (LOPRESSOR) 100 MG tablet Take 100 mg by mouth daily.      . metoprolol tartrate (LOPRESSOR) 25 MG tablet Take 1 tablet (25 mg total) by mouth daily. Takes in addition to the 100mg  daily (ON HOLD AS OF TODAY, 10/07/13)      . Multiple Vitamin (MULTIVITAMIN) tablet Take 1 tablet by mouth daily.      . ranitidine (ZANTAC) 150 MG tablet Take 150 mg by mouth daily.      Marland Kitchen zolpidem (AMBIEN) 10 MG tablet Take 10 mg by mouth at bedtime as needed. Sleep.       No current facility-administered medications for this visit.    Past Medical History  Diagnosis Date  . Depression   . Anxiety   . Insomnia   . Essential hypertension, benign   .  GERD (gastroesophageal reflux disease)   . Asthma   . Nephrolithiasis   . Iron deficiency anemia   . Back pain   . History of migraine headaches     Past Surgical History  Procedure Laterality Date  . Gastric bypass    . Appendectomy      Reportedly appendiceal tumor  . Replacement total knee Left   . Tonsillectomy and adenoidectomy    . Cystoscopy    . Breast biopsy    . Right oophorectomy    . Rhinologic surgery      History   Social History  . Marital Status: Divorced    Spouse Name: N/A    Number of Children: N/A  . Years of Education: 12+   Occupational History  .      Social History Main Topics  . Smoking status: Never Smoker   . Smokeless tobacco: Never Used  . Alcohol Use: 2.4 oz/week    4 Glasses of wine per week     Comment: Regular alcohol intake, 5 days a week  . Drug Use: No  . Sexual Activity: Not on file   Other Topics Concern  . Not on file   Social History Narrative  . No narrative on file     Family History  Problem Relation Age of Onset  . Asthma Mother   . Hyperlipidemia Mother   . Thyroid disease Mother   . Hyperlipidemia Father      Prior to Admission medications   Medication Sig Start Date End Date Taking? Authorizing Provider  albuterol (PROVENTIL HFA;VENTOLIN HFA) 108 (90 BASE) MCG/ACT inhaler Inhale 1-2 puffs into the lungs every 6 (six) hours as needed for wheezing or shortness of breath.   Yes Historical Provider, MD  Cholecalciferol (VITAMIN D3) 1000 UNITS CAPS Take 1 capsule by mouth daily.   Yes Historical Provider, MD  Cyanocobalamin (VITAMIN B-12 IJ) Take one injection monthly   Yes Historical Provider, MD  DULoxetine (CYMBALTA) 60 MG capsule Take 60 mg by mouth daily.   Yes Historical Provider, MD  fluticasone (FLONASE) 50 MCG/ACT nasal spray Place 1 spray into both nostrils as needed for allergies or rhinitis.   Yes Historical Provider, MD  LORazepam (ATIVAN) 0.5 MG tablet Take 0.5 mg by mouth daily as needed. Anxiety.   Yes Historical Provider, MD  metoprolol (LOPRESSOR) 100 MG tablet Take 100 mg by mouth daily.   Yes Historical Provider, MD  metoprolol tartrate (LOPRESSOR) 25 MG tablet Take 1 tablet (25 mg total) by mouth daily. Takes in addition to the 100mg  daily (ON HOLD AS OF TODAY, 10/07/13) 10/07/13  Yes Laqueta Linden, MD  Multiple Vitamin (MULTIVITAMIN) tablet Take 1 tablet by mouth daily.   Yes Historical Provider, MD  ranitidine (ZANTAC) 150 MG tablet Take 150 mg by mouth daily.   Yes Historical Provider, MD  zolpidem (AMBIEN) 10 MG tablet Take 10 mg by mouth at bedtime as needed. Sleep.   Yes  Historical Provider, MD     Review of systems complete and found to be negative unless listed above in HPI     Physical exam Blood pressure 108/71, pulse 55, height 5\' 4"  (1.626 m), weight 272 lb 12.8 oz (123.741 kg), SpO2 96.00%. General: NAD, obese Neck: No JVD, no thyromegaly or thyroid nodule.  Lungs: Clear to auscultation bilaterally with normal respiratory effort. CV: Nondisplaced PMI.  Heart regular S1/S2, no S3/S4, no murmur.  No peripheral edema.  No carotid bruit.  Normal pedal pulses.  Abdomen:  Soft, nontender, no hepatosplenomegaly, no distention.  Skin: Intact without lesions or rashes.  Neurologic: Alert and oriented x 3.  Psych: Normal affect. Extremities: No clubbing or cyanosis.  HEENT: Normal.   Labs:   Lab Results  Component Value Date   WBC 7.7 02/10/2010   HGB 13.7 02/10/2010   HCT 39.3 02/10/2010   MCV 96.0 02/10/2010   PLT 284 02/10/2010   No results found for this basename: NA, K, CL, CO2, BUN, CREATININE, CALCIUM, LABALBU, PROT, BILITOT, ALKPHOS, ALT, AST, GLUCOSE,  in the last 168 hours No results found for this basename: CKTOTAL, CKMB, CKMBINDEX, TROPONINI    No results found for this basename: CHOL   No results found for this basename: HDL   No results found for this basename: LDLCALC   No results found for this basename: TRIG   No results found for this basename: CHOLHDL   No results found for this basename: LDLDIRECT       EKG: NSR, 55 bpm, late R wave transition   ASSESSMENT AND PLAN:  1. Palpitations: she reportedly does not have a history of dysrhythmias. In order to more comprehensively evaluate this, I will have her wear a 21-day event monitor to evaluate for tachyarrhythmias (SVT, symptomatic premature contractions). I will have her reduce her Toprol-XL to 100 mg daily in the meantime.  2. Chest pain: while this may be due to GERD, given her comorbidities, I will proceed with a Lexiscan Cardiolite study to evaluate for inducible ischemia.  With the Lexiscan itself, it may be possible to induce her palpitations, potentially uncovering the arrhythmia she may be experiencing. 3. HTN: controlled on current therapy.  Dispo: f/u in 6 weeks.  Signed: Prentice Docker, M.D., F.A.C.C.  10/07/2013, 4:06 PM

## 2013-10-07 NOTE — Patient Instructions (Signed)
   Hold the Metoprolol 25mg  dose for now  Continue the Metoprolol 100mg  daily only  Continue all other medications.   Your physician has requested that you have a lexiscan myoview. For further information please visit [email or web address] Please follow instruction sheet, as given. Your physician has recommended that you wear a 21 day event monitor. Event monitors are medical devices that record the heart's electrical activity. Doctors most often [place] these monitors to diagnose arrhythmias. Arrhythmias are problems with the speed or rhythm of the heartbeat. The monitor is a small, portable device. You can wear one while you do your normal daily activities. This is usually used to diagnose what is causing palpitations/syncope (passing out). Office will contact with results via phone or letter.    Follow up in  6-8 weeks

## 2013-10-09 DIAGNOSIS — R002 Palpitations: Secondary | ICD-10-CM

## 2013-10-14 ENCOUNTER — Ambulatory Visit: Payer: Managed Care, Other (non HMO)

## 2013-10-17 ENCOUNTER — Encounter (HOSPITAL_COMMUNITY): Payer: Self-pay

## 2013-10-17 ENCOUNTER — Encounter (HOSPITAL_COMMUNITY)
Admission: RE | Admit: 2013-10-17 | Discharge: 2013-10-17 | Disposition: A | Discharge status: Home / Self Care | Payer: Managed Care, Other (non HMO) | Source: Ambulatory Visit | Attending: Cardiovascular Disease | Admitting: Cardiovascular Disease

## 2013-10-17 DIAGNOSIS — R079 Chest pain, unspecified: Secondary | ICD-10-CM

## 2013-10-17 DIAGNOSIS — R002 Palpitations: Secondary | ICD-10-CM

## 2013-10-17 MED ORDER — TECHNETIUM TC 99M SESTAMIBI GENERIC - CARDIOLITE
10.0000 | Freq: Once | INTRAVENOUS | Status: AC | PRN
  Administered 2013-10-17: 10 via INTRAVENOUS

## 2013-10-17 MED ORDER — SODIUM CHLORIDE 0.9 % IJ SOLN
INTRAMUSCULAR | Status: AC
  Administered 2013-10-17: 10 mL via INTRAVENOUS
  Filled 2013-10-17: qty 10

## 2013-10-17 MED ORDER — REGADENOSON 0.4 MG/5ML IV SOLN
INTRAVENOUS | Status: AC
  Administered 2013-10-17: 0.4 mg via INTRAVENOUS
  Filled 2013-10-17: qty 5

## 2013-10-17 MED ORDER — TECHNETIUM TC 99M SESTAMIBI - CARDIOLITE
30.0000 | Freq: Once | INTRAVENOUS | Status: AC | PRN
  Administered 2013-10-17: 30 via INTRAVENOUS

## 2013-10-17 NOTE — Progress Notes (Signed)
[facility] Nurses Notes - [facility]  Nyellie Yetter 10/17/2013 Reason for doing test: Chest Pain and palpitation Type of test: Marlane Hatcher Nurse performing test: Parke Poisson, RN Nuclear Medicine Tech: Lou Cal Echo Tech: Not Applicable MD performing test: Ival Bible / K.Lyman Bishop NP Family MD: Dr. Riley Nearing Test explained and consent signed: yes IV started: 22g jelco, Saline lock flushed, IV in progress from floor and Saline lock started in radiology Symptoms: Dizzy, chest pressure & stomach discomfort Treatment/Intervention: None Reason test stopped: protocol completed After recovery IV was: Discontinued via X-ray tech and No redness or edema Patient to return to Nuc. Med at : 11:15 Patient discharged: Home Patient's Condition upon discharge was: stable Comments: During test BP 120/58 & HR 86.  Recovery BP 108/60 & HR 78.  Symptoms resolved in recovery. Erskine Speed T

## 2013-10-20 ENCOUNTER — Encounter: Payer: Self-pay | Admitting: *Deleted

## 2013-11-12 ENCOUNTER — Ambulatory Visit (INDEPENDENT_AMBULATORY_CARE_PROVIDER_SITE_OTHER): Payer: Managed Care, Other (non HMO) | Admitting: Cardiovascular Disease

## 2013-11-12 ENCOUNTER — Encounter: Payer: Self-pay | Admitting: Cardiovascular Disease

## 2013-11-12 VITALS — BP 114/74 | HR 51 | Ht 64.0 in | Wt 276.0 lb

## 2013-11-12 DIAGNOSIS — I471 Supraventricular tachycardia: Secondary | ICD-10-CM

## 2013-11-12 DIAGNOSIS — K219 Gastro-esophageal reflux disease without esophagitis: Secondary | ICD-10-CM

## 2013-11-12 DIAGNOSIS — I10 Essential (primary) hypertension: Secondary | ICD-10-CM

## 2013-11-12 DIAGNOSIS — R079 Chest pain, unspecified: Secondary | ICD-10-CM

## 2013-11-12 DIAGNOSIS — R002 Palpitations: Secondary | ICD-10-CM

## 2013-11-12 NOTE — Patient Instructions (Signed)
Continue all current medications. Follow up in  3 months 

## 2013-11-12 NOTE — Progress Notes (Signed)
Patient ID: Meghan Macdonald, female   DOB: [birth year], 53 y.o.   MRN: [id number]      SUBJECTIVE: The patient is here to followup on the results of cardiovascular testing performed for the evaluation of chest pain and palpitations. Her Lexiscan Cardiolite was normal with only soft tissue attenuation artifact seen, with no evidence of ischemia or scar and normal left ventricular systolic function. Her event monitor demonstrated SVT with heart rates as high as 220 beats per minute. She experienced a slight headache during these episodes, but denies dizziness and syncope. She had been taking Toprol-XL 100 mg daily while wearing the monitor, and then increased it to 125 mg daily afterwards and this dose seems to have alleviated her symptoms. She has been experiencing GERD and abdominal bloating with associated belching and is due to see her gastroenterologist.    No Known Allergies  Current Outpatient Prescriptions  Medication Sig Dispense Refill  . albuterol (PROVENTIL HFA;VENTOLIN HFA) 108 (90 BASE) MCG/ACT inhaler Inhale 1-2 puffs into the lungs every 6 (six) hours as needed for wheezing or shortness of breath.      . Cholecalciferol (VITAMIN D3) 1000 UNITS CAPS Take 1 capsule by mouth daily.      . Cyanocobalamin (VITAMIN B-12 IJ) Take one injection monthly      . escitalopram (LEXAPRO) 10 MG tablet Take 10 mg by mouth daily.      . fluticasone (FLONASE) 50 MCG/ACT nasal spray Place 1 spray into both nostrils as needed for allergies or rhinitis.      Marland Kitchen LORazepam (ATIVAN) 0.5 MG tablet Take 0.5 mg by mouth daily as needed. Anxiety.      . metoprolol succinate (TOPROL-XL) 100 MG 24 hr tablet Take 1 tablet by mouth daily.      . metoprolol succinate (TOPROL-XL) 25 MG 24 hr tablet Take 25 mg by mouth daily.      . Multiple Vitamin (MULTIVITAMIN) tablet Take 1 tablet by mouth daily.      . ranitidine (ZANTAC) 150 MG tablet Take 150 mg by mouth daily.      Marland Kitchen zolpidem (AMBIEN) 10 MG tablet Take 10 mg by  mouth at bedtime as needed. Sleep.       No current facility-administered medications for this visit.    Past Medical History  Diagnosis Date  . Depression   . Anxiety   . Insomnia   . Essential hypertension, benign   . GERD (gastroesophageal reflux disease)   . Asthma   . Nephrolithiasis   . Iron deficiency anemia   . Back pain   . History of migraine headaches     Past Surgical History  Procedure Laterality Date  . Gastric bypass    . Appendectomy      Reportedly appendiceal tumor  . Replacement total knee Left   . Tonsillectomy and adenoidectomy    . Cystoscopy    . Breast biopsy    . Right oophorectomy    . Rhinologic surgery      History   Social History  . Marital Status: Divorced    Spouse Name: N/A    Number of Children: N/A  . Years of Education: 12+   Occupational History  .     Social History Main Topics  . Smoking status: Never Smoker   . Smokeless tobacco: Never Used  . Alcohol Use: 2.4 oz/week    4 Glasses of wine per week     Comment: Regular alcohol intake, 5 days a week  .  Drug Use: No  . Sexual Activity: Not on file   Other Topics Concern  . Not on file   Social History Narrative  . No narrative on file     Filed Vitals:   11/12/13 1502  BP: 114/74  Pulse: 51  Height: 5\' 4"  (1.626 m)  Weight: 276 lb (125.193 kg)    PHYSICAL EXAM General: NAD, obese Neck: No JVD, no thyromegaly or thyroid nodule.  Lungs: Clear to auscultation bilaterally with normal respiratory effort. CV: Nondisplaced PMI.  Heart regular S1/S2, no S3/S4, no murmur.  No peripheral edema.  No carotid bruit.  Normal pedal pulses.  Abdomen: Soft, nontender, no hepatosplenomegaly, no distention.  Neurologic: Alert and oriented x 3.  Psych: Normal affect. Extremities: No clubbing or cyanosis.   ECG: reviewed and available in electronic records.      ASSESSMENT AND PLAN: 1. Palpitations/SVT: I spoke to her at length with regards to the medical management  of supraventricular tachycardia versus proceeding with an ablation. For the time being, she would like to try medical management to see if this helps alleviate her symptoms. I will continue Toprol-XL 125 mg daily. Although her resting heart rate is in the 50 beat per minute range at present, she denies any lightheadedness,dizziness or fatigue. 2. Chest pain: this appears to be due to GERD, given the normal results of her Lexiscan Cardiolite study. 3. HTN: controlled on current therapy.  Dispo: f/u 3 months.  Kate Sable, M.D., F.A.C.C.

## 2013-11-21 ENCOUNTER — Ambulatory Visit
Admission: RE | Admit: 2013-11-21 | Discharge: 2013-11-21 | Disposition: A | Discharge status: Home / Self Care | Payer: Managed Care, Other (non HMO) | Source: Ambulatory Visit

## 2013-11-21 DIAGNOSIS — Z1231 Encounter for screening mammogram for malignant neoplasm of breast: Secondary | ICD-10-CM

## 2013-12-02 ENCOUNTER — Other Ambulatory Visit (HOSPITAL_COMMUNITY): Payer: Self-pay | Admitting: Orthopaedic Surgery

## 2013-12-02 DIAGNOSIS — M25561 Pain in right knee: Secondary | ICD-10-CM

## 2013-12-05 ENCOUNTER — Ambulatory Visit (HOSPITAL_COMMUNITY): Payer: Managed Care, Other (non HMO)

## 2014-02-20 ENCOUNTER — Ambulatory Visit: Payer: Managed Care, Other (non HMO) | Admitting: Cardiovascular Disease

## 2014-02-27 ENCOUNTER — Ambulatory Visit (INDEPENDENT_AMBULATORY_CARE_PROVIDER_SITE_OTHER): Payer: Managed Care, Other (non HMO) | Admitting: Cardiovascular Disease

## 2014-02-27 ENCOUNTER — Encounter: Payer: Self-pay | Admitting: Cardiovascular Disease

## 2014-02-27 VITALS — BP 107/75 | HR 62 | Ht 64.0 in | Wt 287.0 lb

## 2014-02-27 DIAGNOSIS — I10 Essential (primary) hypertension: Secondary | ICD-10-CM

## 2014-02-27 DIAGNOSIS — I471 Supraventricular tachycardia: Secondary | ICD-10-CM

## 2014-02-27 DIAGNOSIS — R002 Palpitations: Secondary | ICD-10-CM

## 2014-02-27 DIAGNOSIS — K219 Gastro-esophageal reflux disease without esophagitis: Secondary | ICD-10-CM

## 2014-02-27 NOTE — Progress Notes (Signed)
Patient ID: Meghan Macdonald, female   DOB: July 27, 1961, 53 y.o.   MRN: [id number]      SUBJECTIVE: The patient is here to followup for paroxysmal SVT. She has a history of chest pain related to GERD. She also has hypertension. She had some palpitations last night, but has been undergoing a considerable amount of stress and also has been consuming more caffeine. Her father was recently diagnosed with lung cancer. Her GERD is well controlled with Zantac.    No Known Allergies  Current Outpatient Prescriptions  Medication Sig Dispense Refill  . albuterol (PROVENTIL HFA;VENTOLIN HFA) 108 (90 BASE) MCG/ACT inhaler Inhale 1-2 puffs into the lungs every 6 (six) hours as needed for wheezing or shortness of breath.      Marland Kitchen amoxicillin-clavulanate (AUGMENTIN) 875-125 MG per tablet Take 1 tablet by mouth 2 (two) times daily.      . Cholecalciferol (VITAMIN D3) 1000 UNITS CAPS Take 1 capsule by mouth daily.      . Cyanocobalamin (VITAMIN B-12 IJ) Take one injection monthly      . fluticasone (FLONASE) 50 MCG/ACT nasal spray Place 1 spray into both nostrils as needed for allergies or rhinitis.      Marland Kitchen LORazepam (ATIVAN) 0.5 MG tablet Take 0.5 mg by mouth daily as needed. Anxiety.      . metoprolol succinate (TOPROL-XL) 100 MG 24 hr tablet Take 1 tablet by mouth daily.      . metoprolol succinate (TOPROL-XL) 25 MG 24 hr tablet Take 25 mg by mouth daily.      . Multiple Vitamin (MULTIVITAMIN) tablet Take 1 tablet by mouth daily.      Marland Kitchen PARoxetine (PAXIL) 20 MG tablet Take 1 tablet by mouth daily.      . ranitidine (ZANTAC) 150 MG tablet Take 150 mg by mouth daily.      Marland Kitchen zolpidem (AMBIEN) 10 MG tablet Take 10 mg by mouth at bedtime as needed. Sleep.       No current facility-administered medications for this visit.    Past Medical History  Diagnosis Date  . Depression   . Anxiety   . Insomnia   . Essential hypertension, benign   . GERD (gastroesophageal reflux disease)   . Asthma   . Nephrolithiasis     . Iron deficiency anemia   . Back pain   . History of migraine headaches     Past Surgical History  Procedure Laterality Date  . Gastric bypass    . Appendectomy      Reportedly appendiceal tumor  . Replacement total knee Left   . Tonsillectomy and adenoidectomy    . Cystoscopy    . Breast biopsy    . Right oophorectomy    . Rhinologic surgery      History   Social History  . Marital Status: Divorced    Spouse Name: N/A    Number of Children: N/A  . Years of Education: 12+   Occupational History  .     Social History Main Topics  . Smoking status: Never Smoker   . Smokeless tobacco: Never Used  . Alcohol Use: 2.4 oz/week    4 Glasses of wine per week     Comment: Regular alcohol intake, 5 days a week  . Drug Use: No  . Sexual Activity: Not on file   Other Topics Concern  . Not on file   Social History Narrative  . No narrative on file     Filed Vitals:   02/27/14  0850  BP: 107/75  Pulse: 62  Height: 5\' 4"  (1.626 m)  Weight: 287 lb (130.182 kg)  SpO2: 97%    PHYSICAL EXAM General: NAD, obese Neck: No JVD, no thyromegaly. Lungs: Clear to auscultation bilaterally with normal respiratory effort. CV: Nondisplaced PMI.  Regular rate and rhythm, normal S1/S2, no S3/S4, no murmur. No pretibial or periankle edema.  No carotid bruit.  Normal pedal pulses.  Abdomen: Soft, nontender, no hepatosplenomegaly, no distention.  Neurologic: Alert and oriented x 3.  Psych: Normal affect. Extremities: No clubbing or cyanosis.   ECG: reviewed and available in electronic records.      ASSESSMENT AND PLAN: 1. Palpitations/SVT: I previously spoke to her at length with regards to the medical management of supraventricular tachycardia versus proceeding with an ablation. For the time being, she would like to continue to try medical management. I will continue Toprol-XL 125 mg daily. 2. Chest pain: This is resolved with Zantac, and is due to GERD. Lexiscan Cardiolite  study was normal. 3. HTN: Cntrolled on current therapy.   Dispo: f/u 105months.    Kate Sable, M.D., F.A.C.C.

## 2014-02-27 NOTE — Patient Instructions (Signed)
Continue all current medications. Your physician wants you to follow up in: 6 months.  You will receive a reminder letter in the mail one-two months in advance.  If you don't receive a letter, please call our office to schedule the follow up appointment   

## 2014-05-06 DIAGNOSIS — I471 Supraventricular tachycardia, unspecified: Secondary | ICD-10-CM | POA: Insufficient documentation

## 2014-05-06 DIAGNOSIS — G4700 Insomnia, unspecified: Secondary | ICD-10-CM | POA: Insufficient documentation

## 2014-06-05 ENCOUNTER — Emergency Department (HOSPITAL_BASED_OUTPATIENT_CLINIC_OR_DEPARTMENT_OTHER)
Admission: EM | Admit: 2014-06-05 | Discharge: 2014-06-05 | Disposition: A | Discharge status: Home / Self Care | Payer: Managed Care, Other (non HMO) | Attending: Emergency Medicine | Admitting: Emergency Medicine

## 2014-06-05 ENCOUNTER — Emergency Department (HOSPITAL_BASED_OUTPATIENT_CLINIC_OR_DEPARTMENT_OTHER): Payer: Managed Care, Other (non HMO)

## 2014-06-05 ENCOUNTER — Encounter (HOSPITAL_BASED_OUTPATIENT_CLINIC_OR_DEPARTMENT_OTHER): Payer: Self-pay | Admitting: Emergency Medicine

## 2014-06-05 DIAGNOSIS — S298XXA Other specified injuries of thorax, initial encounter: Secondary | ICD-10-CM | POA: Insufficient documentation

## 2014-06-05 DIAGNOSIS — W010XXA Fall on same level from slipping, tripping and stumbling without subsequent striking against object, initial encounter: Secondary | ICD-10-CM | POA: Insufficient documentation

## 2014-06-05 DIAGNOSIS — Z87442 Personal history of urinary calculi: Secondary | ICD-10-CM | POA: Insufficient documentation

## 2014-06-05 DIAGNOSIS — I10 Essential (primary) hypertension: Secondary | ICD-10-CM | POA: Insufficient documentation

## 2014-06-05 DIAGNOSIS — J45909 Unspecified asthma, uncomplicated: Secondary | ICD-10-CM | POA: Insufficient documentation

## 2014-06-05 DIAGNOSIS — K219 Gastro-esophageal reflux disease without esophagitis: Secondary | ICD-10-CM | POA: Insufficient documentation

## 2014-06-05 DIAGNOSIS — Y9389 Activity, other specified: Secondary | ICD-10-CM | POA: Insufficient documentation

## 2014-06-05 DIAGNOSIS — F411 Generalized anxiety disorder: Secondary | ICD-10-CM | POA: Insufficient documentation

## 2014-06-05 DIAGNOSIS — F3289 Other specified depressive episodes: Secondary | ICD-10-CM | POA: Insufficient documentation

## 2014-06-05 DIAGNOSIS — F329 Major depressive disorder, single episode, unspecified: Secondary | ICD-10-CM | POA: Insufficient documentation

## 2014-06-05 DIAGNOSIS — Y929 Unspecified place or not applicable: Secondary | ICD-10-CM | POA: Insufficient documentation

## 2014-06-05 DIAGNOSIS — Z862 Personal history of diseases of the blood and blood-forming organs and certain disorders involving the immune mechanism: Secondary | ICD-10-CM | POA: Insufficient documentation

## 2014-06-05 DIAGNOSIS — IMO0002 Reserved for concepts with insufficient information to code with codable children: Secondary | ICD-10-CM | POA: Insufficient documentation

## 2014-06-05 DIAGNOSIS — Z79899 Other long term (current) drug therapy: Secondary | ICD-10-CM | POA: Insufficient documentation

## 2014-06-05 DIAGNOSIS — S2249XA Multiple fractures of ribs, unspecified side, initial encounter for closed fracture: Secondary | ICD-10-CM | POA: Insufficient documentation

## 2014-06-05 DIAGNOSIS — S2241XA Multiple fractures of ribs, right side, initial encounter for closed fracture: Secondary | ICD-10-CM

## 2014-06-05 DIAGNOSIS — G4700 Insomnia, unspecified: Secondary | ICD-10-CM | POA: Insufficient documentation

## 2014-06-05 DIAGNOSIS — Z9884 Bariatric surgery status: Secondary | ICD-10-CM | POA: Insufficient documentation

## 2014-06-05 DIAGNOSIS — G43909 Migraine, unspecified, not intractable, without status migrainosus: Secondary | ICD-10-CM | POA: Insufficient documentation

## 2014-06-05 MED ORDER — OXYCODONE-ACETAMINOPHEN 5-325 MG PO TABS
2.0000 | ORAL_TABLET | Freq: Once | ORAL | Status: AC
  Administered 2014-06-05: 2 via ORAL
  Filled 2014-06-05: qty 2

## 2014-06-05 MED ORDER — OXYCODONE-ACETAMINOPHEN 5-325 MG PO TABS: 1.0000 | ORAL_TABLET | Freq: Four times a day (QID) | ORAL | Status: DC | PRN

## 2014-06-05 NOTE — ED Provider Notes (Signed)
CSN: [id number]     Arrival date & time 06/05/14  33 History   First MD Initiated Contact with Patient 06/05/14 1451     Chief Complaint  Patient presents with  . Rib Injury     (Consider location/radiation/quality/duration/timing/severity/associated sxs/prior Treatment) HPI Comments: 53 year old morbidly obese female presents to the emergency department complaining of right rib pain worsening today. Patient reports a week and a half ago she tripped and fell, causing her to and on her back. States initially she had pain in her shoulder, however today while she was at work she began getting sharp pains in the right side of her ribs, anteriorly radiating around towards her back. Denies chest pain, reports this as pain in her ribs. Pain worse with certain movements and if she presses on the area. Denies shortness of breath or difficulty breathing.  The history is provided by the patient.    Past Medical History  Diagnosis Date  . Depression   . Anxiety   . Insomnia   . Essential hypertension, benign   . GERD (gastroesophageal reflux disease)   . Asthma   . Nephrolithiasis   . Iron deficiency anemia   . Back pain   . History of migraine headaches    Past Surgical History  Procedure Laterality Date  . Gastric bypass    . Appendectomy      Reportedly appendiceal tumor  . Replacement total knee Left   . Tonsillectomy and adenoidectomy    . Cystoscopy    . Breast biopsy    . Right oophorectomy    . Rhinologic surgery     Family History  Problem Relation Age of Onset  . Asthma Mother   . Hyperlipidemia Mother   . Thyroid disease Mother   . Hyperlipidemia Father    History  Substance Use Topics  . Smoking status: Never Smoker   . Smokeless tobacco: Never Used  . Alcohol Use: 2.4 oz/week    4 Glasses of wine per week     Comment: Regular alcohol intake, 5 days a week   OB History   Grav Para Term Preterm Abortions TAB SAB Ect Mult Living                 Review of  Systems  Musculoskeletal:       + right sided rib pain.  All other systems reviewed and are negative.     Allergies  Review of patient's allergies indicates no known allergies.  Home Medications   Prior to Admission medications   Medication Sig Start Date End Date Taking? Authorizing Provider  albuterol (PROVENTIL HFA;VENTOLIN HFA) 108 (90 BASE) MCG/ACT inhaler Inhale 1-2 puffs into the lungs every 6 (six) hours as needed for wheezing or shortness of breath.    Historical Provider, MD  Cholecalciferol (VITAMIN D3) 1000 UNITS CAPS Take 1 capsule by mouth daily.    Historical Provider, MD  Cyanocobalamin (VITAMIN B-12 IJ) Take one injection monthly    Historical Provider, MD  fluticasone (FLONASE) 50 MCG/ACT nasal spray Place 1 spray into both nostrils as needed for allergies or rhinitis.    Historical Provider, MD  LORazepam (ATIVAN) 0.5 MG tablet Take 0.5 mg by mouth daily as needed. Anxiety.    Historical Provider, MD  metoprolol succinate (TOPROL-XL) 100 MG 24 hr tablet Take 1 tablet by mouth daily. 09/18/13   Historical Provider, MD  metoprolol succinate (TOPROL-XL) 25 MG 24 hr tablet Take 25 mg by mouth daily.    Historical Provider,  MD  Multiple Vitamin (MULTIVITAMIN) tablet Take 1 tablet by mouth daily.    Historical Provider, MD  oxyCODONE-acetaminophen (PERCOCET) 5-325 MG per tablet Take 1-2 tablets by mouth every 6 (six) hours as needed for severe pain. 06/05/14   Illene Labrador, PA-C  PARoxetine (PAXIL) 20 MG tablet Take 1 tablet by mouth daily. 02/24/14   Historical Provider, MD  ranitidine (ZANTAC) 150 MG tablet Take 150 mg by mouth daily.    Historical Provider, MD  zolpidem (AMBIEN) 10 MG tablet Take 10 mg by mouth at bedtime as needed. Sleep.    Historical Provider, MD   BP 154/92  Pulse 87  Temp(Src) 98.1 F (36.7 C) (Oral)  Resp 22  Ht 5\' 4"  (1.626 m)  Wt 287 lb (130.182 kg)  BMI 49.24 kg/m2  SpO2 98% Physical Exam  Nursing note and vitals  reviewed. Constitutional: She is oriented to person, place, and time. She appears well-developed and well-nourished. No distress.  Morbidly obese.  HENT:  Head: Normocephalic and atraumatic.  Mouth/Throat: Oropharynx is clear and moist.  Eyes: Conjunctivae and EOM are normal.  Neck: Normal range of motion. Neck supple.  Cardiovascular: Normal rate, regular rhythm and normal heart sounds.   Pulmonary/Chest: Effort normal and breath sounds normal. No respiratory distress.  Exam limited by patient's body habitus. Tender to palpation underneath her right breast, right lateral ribs. No crepitus or step-off palpated. No bruising or signs of trauma.  Musculoskeletal: Normal range of motion. She exhibits no edema.  Neurological: She is alert and oriented to person, place, and time. No sensory deficit.  Skin: Skin is warm and dry.  Psychiatric: She has a normal mood and affect. Her behavior is normal.    ED Course  Procedures (including critical care time) Labs Review Labs Reviewed - No data to display  Imaging Review Dg Ribs Bilateral W/chest  06/05/2014   CLINICAL DATA:  Recent injury with right-sided chest pain  EXAM: BILATERAL RIBS AND CHEST - 4+ VIEW  COMPARISON:  None.  FINDINGS: Cardiac shadow is within normal limits. The lungs are well aerated bilaterally. Postsurgical changes are noted in the left upper abdomen.  Multiple right-sided rib fractures are identified to include the second, third, fourth and fifth ribs posteriorly. Mild displacement is noted. No pneumothorax is seen.  IMPRESSION: Multiple right-sided rib fractures without complicating factors.   Electronically Signed   By: Inez Catalina M.D.   On: 06/05/2014 15:21     EKG Interpretation None      MDM   Final diagnoses:  Multiple rib fractures, right, closed, initial encounter   Patient presenting with multiple rib fractures. She is well appearing and in no apparent distress. No respiratory distress. Lungs clear. X-ray  showing multiple right-sided rib fractures without complicating factors. Will discharge patient with incentive spirometer and pain medication. Followup with primary care physician. Stable for discharge. Return precautions given. Patient states understanding of treatment care plan and is agreeable.   Illene Labrador, PA-C 06/05/14 1549

## 2014-06-05 NOTE — ED Notes (Signed)
Pt reports falling last week.  Reports (R) rib pain, tender on palpation and with movement.

## 2014-06-05 NOTE — Discharge Instructions (Signed)
Take percocet for severe pain only. No driving or operating heavy machinery while taking percocet. This medication may cause drowsiness. Apply ice to the right side of her ribs intermittently throughout the day. Use the incentive spirometer as directed. Followup with your primary care physician in one week for reevaluation.  Rib Fracture A rib fracture is a break or crack in one of the bones of the ribs. The ribs are a group of long, curved bones that wrap around your chest and attach to your spine. They protect your lungs and other organs in the chest cavity. A broken or cracked rib is often painful, but most do not cause other problems. Most rib fractures heal on their own over time. However, rib fractures can be more serious if multiple ribs are broken or if broken ribs move out of place and push against other structures. CAUSES   A direct blow to the chest. For example, this could happen during contact sports, a car accident, or a fall against a hard object.  Repetitive movements with high force, such as pitching a baseball or having severe coughing spells. SYMPTOMS   Pain when you breathe in or cough.  Pain when someone presses on the injured area. DIAGNOSIS  Your caregiver will perform a physical exam. Various imaging tests may be ordered to confirm the diagnosis and to look for related injuries. These tests may include a chest X-ray, computed tomography (CT), magnetic resonance imaging (MRI), or a bone scan. TREATMENT  Rib fractures usually heal on their own in 1-3 months. The longer healing period is often associated with a continued cough or other aggravating activities. During the healing period, pain control is very important. Medication is usually given to control pain. Hospitalization or surgery may be needed for more severe injuries, such as those in which multiple ribs are broken or the ribs have moved out of place.  HOME CARE INSTRUCTIONS   Avoid strenuous activity and any  activities or movements that cause pain. Be careful during activities and avoid bumping the injured rib.  Gradually increase activity as directed by your caregiver.  Only take over-the-counter or prescription medications as directed by your caregiver. Do not take other medications without asking your caregiver first.  Apply ice to the injured area for the first 1-2 days after you have been treated or as directed by your caregiver. Applying ice helps to reduce inflammation and pain.  Put ice in a plastic bag.  Place a towel between your skin and the bag.   Leave the ice on for 15-20 minutes at a time, every 2 hours while you are awake.  Perform deep breathing as directed by your caregiver. This will help prevent pneumonia, which is a common complication of a broken rib. Your caregiver may instruct you to:  Take deep breaths several times a day.  Try to cough several times a day, holding a pillow against the injured area.  Use a device called an incentive spirometer to practice deep breathing several times a day.  Drink enough fluids to keep your urine clear or pale yellow. This will help you avoid constipation.   Do not wear a rib belt or binder. These restrict breathing, which can lead to pneumonia.  SEEK IMMEDIATE MEDICAL CARE IF:   You have a fever.   You have difficulty breathing or shortness of breath.   You develop a continual cough, or you cough up thick or bloody sputum.  You feel sick to your stomach (nausea), throw up (  vomit), or have abdominal pain.   You have worsening pain not controlled with medications.  MAKE SURE YOU:  Understand these instructions.  Will watch your condition.  Will get help right away if you are not doing well or get worse. Document Released: 10/23/2005 Document Revised: 06/25/2013 Document Reviewed: 12/25/2012 [facility] Patient Information 2015 [place], [place]. This information is not intended to replace advice given to you by your  health care provider. Make sure you discuss any questions you have with your health care provider.

## 2014-06-05 NOTE — ED Provider Notes (Signed)
Medical screening examination/treatment/procedure(s) were performed by non-physician practitioner and as supervising physician I was immediately available for consultation/collaboration.   EKG Interpretation None       Threasa Beards, MD 06/05/14 1555

## 2014-07-06 DIAGNOSIS — E559 Vitamin D deficiency, unspecified: Secondary | ICD-10-CM | POA: Insufficient documentation

## 2014-09-28 DIAGNOSIS — E611 Iron deficiency: Secondary | ICD-10-CM | POA: Insufficient documentation

## 2015-04-16 ENCOUNTER — Other Ambulatory Visit: Payer: Self-pay | Admitting: Family Medicine

## 2015-04-16 DIAGNOSIS — N6310 Unspecified lump in the right breast, unspecified quadrant: Secondary | ICD-10-CM

## 2015-04-16 DIAGNOSIS — N6001 Solitary cyst of right breast: Secondary | ICD-10-CM | POA: Insufficient documentation

## 2015-04-19 ENCOUNTER — Ambulatory Visit
Admission: RE | Admit: 2015-04-19 | Discharge: 2015-04-19 | Disposition: A | Discharge status: Home / Self Care | Payer: BLUE CROSS/BLUE SHIELD | Source: Ambulatory Visit | Attending: Family Medicine | Admitting: Family Medicine

## 2015-04-19 DIAGNOSIS — N6310 Unspecified lump in the right breast, unspecified quadrant: Secondary | ICD-10-CM

## 2016-09-08 DIAGNOSIS — R5383 Other fatigue: Secondary | ICD-10-CM | POA: Insufficient documentation

## 2016-09-14 ENCOUNTER — Encounter: Payer: Self-pay | Admitting: Internal Medicine

## 2016-09-27 ENCOUNTER — Ambulatory Visit: Payer: BLUE CROSS/BLUE SHIELD | Admitting: Gastroenterology

## 2016-10-11 ENCOUNTER — Encounter: Payer: Self-pay | Admitting: Gastroenterology

## 2016-10-11 ENCOUNTER — Ambulatory Visit (INDEPENDENT_AMBULATORY_CARE_PROVIDER_SITE_OTHER): Payer: Managed Care, Other (non HMO) | Admitting: Gastroenterology

## 2016-10-11 ENCOUNTER — Other Ambulatory Visit: Payer: Self-pay

## 2016-10-11 VITALS — BP 127/77 | HR 51 | Temp 97.5°F | Ht 64.0 in | Wt 300.6 lb

## 2016-10-11 DIAGNOSIS — R1310 Dysphagia, unspecified: Secondary | ICD-10-CM

## 2016-10-11 DIAGNOSIS — K219 Gastro-esophageal reflux disease without esophagitis: Secondary | ICD-10-CM

## 2016-10-11 DIAGNOSIS — C7A020 Malignant carcinoid tumor of the appendix: Secondary | ICD-10-CM

## 2016-10-11 DIAGNOSIS — K2270 Barrett's esophagus without dysplasia: Secondary | ICD-10-CM

## 2016-10-11 DIAGNOSIS — R1319 Other dysphagia: Secondary | ICD-10-CM

## 2016-10-11 DIAGNOSIS — Z1211 Encounter for screening for malignant neoplasm of colon: Secondary | ICD-10-CM

## 2016-10-11 NOTE — Progress Notes (Signed)
Primary Care Physician:  Robyne Peers., MD  Primary Gastroenterologist:  Garfield Cornea, MD   Chief Complaint  Patient presents with  . Gastroesophageal Reflux  . 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 female here at the request of her PCP for further evaluation of GERD and screening colonoscopy. Patient states her last TCS was 10 years ago. She had open gastric bypass in 2011 in [place] and was found to have abnormality of the appendix and underwent appendectomy at that time. 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   Patient states she has had prior EGD (in the 1990s) and told she had Barrett's but subsequent EGDs it has not shown up. She complains of heartburn. She cannot come off PPI. Prozac causes heartburn. Some swallowing issues but may be because "I eat too fast". Feels like food sits in chest. She has been told her cough may be reflux related. Also takes zantac at bedtime. BM several per day. No melena, brbpr. No diarrhea.   Current Outpatient Prescriptions  Medication Sig Dispense Refill  . albuterol (PROVENTIL HFA;VENTOLIN HFA) 108 (90 BASE) MCG/ACT inhaler Inhale 1-2 puffs into the lungs every 6 (six) hours as needed for wheezing or shortness of breath.    . Cholecalciferol (VITAMIN D3) 1000 UNITS CAPS Take 1 capsule by mouth daily.    Marland Kitchen FLUoxetine HCl 60 MG TABS Take 1 tablet by mouth daily.    . fluticasone (FLONASE) 50 MCG/ACT nasal spray Place 1 spray into both nostrils as needed for allergies or rhinitis.    Marland Kitchen LORazepam (ATIVAN) 0.5 MG tablet Take 0.5 mg by mouth daily as needed. Anxiety.    . metoprolol succinate (TOPROL-XL) 100 MG 24 hr tablet Take 1 tablet by mouth daily.    . Multiple Vitamin (MULTIVITAMIN) tablet Take 1 tablet by mouth  daily.    Marland Kitchen omeprazole (PRILOSEC) 20 MG capsule Take 20 mg by mouth daily.     . ranitidine (ZANTAC) 150 MG tablet Take 150 mg by mouth daily.    . vitamin B-12 (CYANOCOBALAMIN) 50 MCG tablet Take 50 mcg by mouth daily.    Marland Kitchen zolpidem (AMBIEN) 10 MG tablet Take 10 mg by mouth at bedtime as needed. Sleep.     No current facility-administered medications for this visit.     Allergies as of 10/11/2016  . (No Known Allergies)    Past Medical History:  Diagnosis Date  . Anxiety   . Asthma   . Back pain   . Carcinoid tumor of appendix   . Depression   . Essential hypertension, benign   . GERD (gastroesophageal reflux disease)   . History of migraine headaches   . Insomnia   . Iron deficiency anemia   . Nephrolithiasis   . Vitamin B deficiency     Past Surgical History:  Procedure Laterality Date  . APPENDECTOMY  2001   carcinoid tumor, found during gastric bypass.  Marland Kitchen BREAST BIOPSY    . COLONOSCOPY WITH ESOPHAGOGASTRODUODENOSCOPY (EGD)  2008   Dr. Collene Mares: s/p right hemicolectomy, sigmoid colon diverticulosis, otherwise unremarkable. EGD unremarkable, s/p gastric bypass, No Barrett's  . CYSTOSCOPY    . GASTRIC BYPASS  2001   incidental finding of carcinoid tumor of appendix at time of surgery  with appendectomy  . REPLACEMENT TOTAL KNEE Left 2006  . Rhinologic surgery    . RIGHT OOPHORECTOMY    . TONSILLECTOMY AND ADENOIDECTOMY      Family History  Problem Relation Age of Onset  . Asthma Mother   . Hyperlipidemia Mother   . Thyroid disease Mother   . Hyperlipidemia Father   . Lung cancer Father   . Colon cancer Maternal Grandmother 80    Social History   Social History  . Marital status: Divorced    Spouse name: N/A  . Number of children: N/A  . Years of education: 12+   Occupational History  .  [employer]   Social History Main Topics  . Smoking status: Never Smoker  . Smokeless tobacco: Never Used  . Alcohol use 2.4 oz/week    4 Glasses of wine  per week     Comment: Regular alcohol intake, 5 days a week  . Drug use: No  . Sexual activity: Not on file   Other Topics Concern  . Not on file   Social History Narrative  . No narrative on file      ROS:  General: Negative for anorexia, weight loss, fever, chills, fatigue, weakness. Eyes: Negative for vision changes.  ENT: Negative for hoarseness, difficulty swallowing , nasal congestion. CV: Negative for chest pain, angina, palpitations, dyspnea on exertion, peripheral edema.  Respiratory: Negative for dyspnea at rest, dyspnea on exertion, cough, sputum, wheezing.  GI: See history of present illness. GU:  Negative for dysuria, hematuria, urinary incontinence, urinary frequency, nocturnal urination.  MS: Negative for joint pain, low back pain.  Derm: Negative for rash or itching.  Neuro: Negative for weakness, abnormal sensation, seizure, frequent headaches, memory loss, confusion.  Psych: Negative for anxiety, depression, suicidal ideation, hallucinations.  Endo: Negative for unusual weight change.  Heme: Negative for bruising or bleeding. Allergy: Negative for rash or hives.    Physical Examination:  BP 127/77   Pulse (!) 51   Temp 97.5 F (36.4 C) (Oral)   Ht 5\' 4"  (1.626 m)   Wt (!) 300 lb 9.6 oz (136.4 kg)   BMI 51.60 kg/m    General: Well-nourished, well-developed in no acute distress.  Head: Normocephalic, atraumatic.   Eyes: Conjunctiva pink, no icterus. Mouth: Oropharyngeal mucosa moist and pink , no lesions erythema or exudate. Neck: Supple without thyromegaly, masses, or lymphadenopathy.  Lungs: Clear to auscultation bilaterally.  Heart: Regular rate and rhythm, no murmurs rubs or gallops.  Abdomen: Bowel sounds are normal, nontender, nondistended, no hepatosplenomegaly or masses, no abdominal bruits or    hernia , no rebound or guarding.   Rectal: not performed Extremities: No lower extremity edema. No clubbing or deformities.  Neuro: Alert and  oriented x 4 , grossly normal neurologically.  Skin: Warm and dry, no rash or jaundice.   Psych: Alert and cooperative, normal mood and affect.  Labs: Have requested  Imaging Studies: No results found.  Impression/Plan: 55 y/o female with h/o remote gastric bypass and incidental appendectomy for carcinoid tumor who presents for GERD/dysphagia, h/o Barrett's and colon cancer screening. Notably last EGD/TCS 2008. No Barrett's then although patient reports positive for barrett's in the 1990s. Plan for EGD+/-ED/TCS in the near future for GERD/dysphagia/colon cancer screening. Plan for deep sedation in OR due to polypharmacy/frequent etoh.  I have discussed the risks, alternatives, benefits with regards to but not limited to the risk of reaction to medication, bleeding, infection, perforation and the patient is agreeable  to proceed. Written consent to be obtained.  Requested copy of recent labs. Patient carries history of IDA/B12 deficiency since her gastric bypass. Unclear if she has ever been checked for celiac disease. Consider SB bx.

## 2016-10-11 NOTE — Patient Instructions (Signed)
1. Colonoscopy and upper endoscopy with Dr. Rourk. See separate instructions. 

## 2016-10-15 ENCOUNTER — Encounter: Payer: Self-pay | Admitting: Gastroenterology

## 2016-10-15 NOTE — Progress Notes (Addendum)
Reviewed labs via care everywhere. 09/2016  WBC 6700, H/H 13.7/40.3, MCV 92, platelet 341,000, iron 80, TIBC 322, iron sat 25, ferritin 127, B12 232  BUN 15, Cre 0.69,tbili 0.5, AP 143H, AST 19, ALT 27, alb 4.1, TSH 4.26, HCV Ab neg  PLEASE HAVE PATIENT RECHECK LFTS IN 2 MONTHS FOR ABNORMAL ALKPHOS

## 2016-10-16 NOTE — Progress Notes (Signed)
CC'D TO PCP °

## 2016-10-24 ENCOUNTER — Other Ambulatory Visit: Payer: Self-pay | Admitting: Gastroenterology

## 2016-10-24 DIAGNOSIS — R748 Abnormal levels of other serum enzymes: Secondary | ICD-10-CM

## 2016-10-24 NOTE — Progress Notes (Signed)
Lab order on file. 

## 2016-11-03 NOTE — Patient Instructions (Addendum)
Meghan Macdonald  11/03/2016     @PREFPERIOPPHARMACY @   Your procedure is scheduled on 11/10/2016.  Report to [facility] at 12:00 P.M.  Call this number if you have problems the morning of surgery:  [phone]   Remember:  Do not eat food or drink liquids after midnight.  Take these medicines the morning of surgery with A SIP OF WATER : Prozac, toprol, prilosec, zantac. Use your albuterol before you come and bring any rescue inhaler with you.   Do not wear jewelry, make-up or nail polish.  Do not wear lotions, powders, or perfumes, or deoderant.  Do not shave 48 hours prior to surgery.  Men may shave face and neck.  Do not bring valuables to the hospital.  [facility] is not responsible for any belongings or valuables.  Contacts, dentures or bridgework may not be worn into surgery.  Leave your suitcase in the car.  After surgery it may be brought to your room.  For patients admitted to the hospital, discharge time will be determined by your treatment team.  Patients discharged the day of surgery will not be allowed to drive home.   Name and phone number of your driver:   family Special instructions:  n/a  Please read over the following fact sheets that you were given. Care and Recovery After Surgery    Esophagogastroduodenoscopy Introduction Esophagogastroduodenoscopy (EGD) is a procedure to examine the lining of the esophagus, stomach, and first part of the small intestine (duodenum). This procedure is done to check for problems such as inflammation, bleeding, ulcers, or growths. During this procedure, a long, flexible, lighted tube with a camera attached (endoscope) is inserted down the throat. Tell a health care provider about:  Any allergies you have.  All medicines you are taking, including vitamins, herbs, eye drops, creams, and over-the-counter medicines.  Any problems you or family members have had with anesthetic medicines.  Any blood disorders you have.  Any  surgeries you have had.  Any medical conditions you have.  Whether you are pregnant or may be pregnant. What are the risks? Generally, this is a safe procedure. However, problems may occur, including:  Infection.  Bleeding.  A tear (perforation) in the esophagus, stomach, or duodenum.  Trouble breathing.  Excessive sweating.  Spasms of the larynx.  A slowed heartbeat.  Low blood pressure. What happens before the procedure?  Follow instructions from your health care provider about eating or drinking restrictions.  Ask your health care provider about:  Changing or stopping your regular medicines. This is especially important if you are taking diabetes medicines or blood thinners.  Taking medicines such as aspirin and ibuprofen. These medicines can thin your blood. Do not take these medicines before your procedure if your health care provider instructs you not to.  Plan to have someone take you home after the procedure.  If you wear dentures, be ready to remove them before the procedure. What happens during the procedure?  To reduce your risk of infection, your health care team will wash or sanitize their hands.  An IV tube will be put in a vein in your hand or arm. You will get medicines and fluids through this tube.  You will be given one or more of the following:  A medicine to help you relax (sedative).  A medicine to numb the area (local anesthetic). This medicine may be sprayed into your throat. It will make you feel more comfortable and keep you from gagging or coughing  during the procedure.  A medicine for pain.  A mouth guard may be placed in your mouth to protect your teeth and to keep you from biting on the endoscope.  You will be asked to lie on your left side.  The endoscope will be lowered down your throat into your esophagus, stomach, and duodenum.  Air will be put into the endoscope. This will help your health care provider see better.  The  lining of your esophagus, stomach, and duodenum will be examined.  Your health care provider may:  Take a tissue sample so it can be looked at in a lab (biopsy).  Remove growths.  Remove objects (foreign bodies) that are stuck.  Treat any bleeding with medicines or other devices that stop tissue from bleeding.  Widen (dilate) or stretch narrowed areas of your esophagus and stomach.  The endoscope will be taken out. The procedure may vary among health care providers and hospitals. What happens after the procedure?  Your blood pressure, heart rate, breathing rate, and blood oxygen level will be monitored often until the medicines you were given have worn off.  Do not eat or drink anything until the numbing medicine has worn off and your gag reflex has returned. This information is not intended to replace advice given to you by your health care provider. Make sure you discuss any questions you have with your health care provider. Document Released: 02/23/2005 Document Revised: 03/30/2016 Document Reviewed: 09/16/2015  2017 Elsevier   Esophagogastroduodenoscopy, Care After Introduction Refer to this sheet in the next few weeks. These instructions provide you with information about caring for yourself after your procedure. Your health care provider may also give you more specific instructions. Your treatment has been planned according to current medical practices, but problems sometimes occur. Call your health care provider if you have any problems or questions after your procedure. What can I expect after the procedure? After the procedure, it is common to have:  A sore throat.  Nausea.  Bloating.  Dizziness.  Fatigue. Follow these instructions at home:  Do not eat or drink anything until the numbing medicine (local anesthetic) has worn off and your gag reflex has returned. You will know that the local anesthetic has worn off when you can swallow comfortably.  Do not drive  for 24 hours if you received a medicine to help you relax (sedative).  If your health care provider took a tissue sample for testing during the procedure, make sure to get your test results. This is your responsibility. Ask your health care provider or the department performing the test when your results will be ready.  Keep all follow-up visits as told by your health care provider. This is important. Contact a health care provider if:  You cannot stop coughing.  You are not urinating.  You are urinating less than usual. Get help right away if:  You have trouble swallowing.  You cannot eat or drink.  You have throat or chest pain that gets worse.  You are dizzy or light-headed.  You faint.  You have nausea or vomiting.  You have chills.  You have a fever.  You have severe abdominal pain.  You have black, tarry, or bloody stools. This information is not intended to replace advice given to you by your health care provider. Make sure you discuss any questions you have with your health care provider. Document Released: 10/09/2012 Document Revised: 03/30/2016 Document Reviewed: 09/16/2015  2017 Elsevier    Esophageal Dilatation Esophageal  dilatation is a procedure to open a blocked or narrowed part of the esophagus. The esophagus is the long tube in your throat that carries food and liquid from your mouth to your stomach. The procedure is also called esophageal dilation. You may need this procedure if you have a buildup of scar tissue in your esophagus that makes it difficult, painful, or even impossible to swallow. This can be caused by gastroesophageal reflux disease (GERD). In rare cases, people need this procedure because they have cancer of the esophagus or a problem with the way food moves through the esophagus. Sometimes you may need to have another dilatation to enlarge the opening of the esophagus gradually. Tell a health care provider about:  Any allergies you  have.  All medicines you are taking, including vitamins, herbs, eye drops, creams, and over-the-counter medicines.  Any problems you or family members have had with anesthetic medicines.  Any blood disorders you have.  Any surgeries you have had.  Any medical conditions you have.  Any antibiotic medicines you are required to take before dental procedures. What are the risks? Generally, this is a safe procedure. However, problems can occur and include:  Bleeding from a tear in the lining of the esophagus.  A hole (perforation) in the esophagus. What happens before the procedure?  Do not eat or drink anything after midnight on the night before the procedure or as directed by your health care provider.  Ask your health care provider about changing or stopping your regular medicines. This is especially important if you are taking diabetes medicines or blood thinners.  Plan to have someone take you home after the procedure. What happens during the procedure?  You will be given a medicine that makes you relaxed and sleepy (sedative).  A medicine may be sprayed or gargled to numb the back of the throat.  Your health care provider can use various instruments to do an esophageal dilatation. During the procedure, the instrument used will be placed in your mouth and passed down into your esophagus. Options include:  Simple dilators. This instrument is carefully placed in the esophagus to stretch it.  Guided wire bougies. In this method, a flexible tube (endoscope) is used to insert a wire into the esophagus. The dilator is passed over this wire to enlarge the esophagus. Then the wire is removed.  Balloon dilators. An endoscope with a small balloon at the end is passed down into the esophagus. Inflating the balloon gently stretches the esophagus and opens it up. What happens after the procedure?  Your blood pressure, heart rate, breathing rate, and blood oxygen level will be monitored  often until the medicines you were given have worn off.  Your throat may feel slightly sore and will probably still feel numb. This will improve slowly over time.  You will not be allowed to eat or drink until the throat numbness has resolved.  If this is a same-day procedure, you may be allowed to go home once you have been able to drink, urinate, and sit on the edge of the bed without nausea or dizziness.  If this is a same-day procedure, you should have a friend or family member with you for the next 24 hours after the procedure. This information is not intended to replace advice given to you by your health care provider. Make sure you discuss any questions you have with your health care provider. Document Released: 12/14/2005 Document Revised: 03/30/2016 Document Reviewed: 03/04/2014 Elsevier Interactive Patient Education  2017 [employer]       Colonoscopy, Adult, Care After This sheet gives you information about how to care for yourself after your procedure. Your health care provider may also give you more specific instructions. If you have problems or questions, contact your health care provider. What can I expect after the procedure? After the procedure, it is common to have:  A small amount of blood in your stool for 24 hours after the procedure.  Some gas.  Mild abdominal cramping or bloating. Follow these instructions at home: General instructions  For the first 24 hours after the procedure:  Do not drive or use machinery.  Do not sign important documents.  Do not drink alcohol.  Do your regular daily activities at a slower pace than normal.  Eat soft, easy-to-digest foods.  Rest often.  Take over-the-counter or prescription medicines only as told by your health care provider.  It is up to you to get the results of your procedure. Ask your health care provider, or the department performing the procedure, when your results will be ready. Relieving cramping  and bloating  Try walking around when you have cramps or feel bloated.  Apply heat to your abdomen as told by your health care provider. Use a heat source that your health care provider recommends, such as a moist heat pack or a heating pad.  Place a towel between your skin and the heat source.  Leave the heat on for 20-30 minutes.  Remove the heat if your skin turns bright red. This is especially important if you are unable to feel pain, heat, or cold. You may have a greater risk of getting burned. Eating and drinking  Drink enough fluid to keep your urine clear or pale yellow.  Resume your normal diet as instructed by your health care provider. Avoid heavy or fried foods that are hard to digest.  Avoid drinking alcohol for as long as instructed by your health care provider. Contact a health care provider if:  You have blood in your stool 2-3 days after the procedure. Get help right away if:  You have more than a small spotting of blood in your stool.  You pass large blood clots in your stool.  Your abdomen is swollen.  You have nausea or vomiting.  You have a fever.  You have increasing abdominal pain that is not relieved with medicine. This information is not intended to replace advice given to you by your health care provider. Make sure you discuss any questions you have with your health care provider. Document Released: 06/06/2004 Document Revised: 07/17/2016 Document Reviewed: 01/04/2016 Elsevier Interactive Patient Education  2017 [place] [employer].      Colonoscopy, Adult A colonoscopy is an exam to look at the entire large intestine. During the exam, a lubricated, bendable tube is inserted into the anus and then passed into the rectum, colon, and other parts of the large intestine. A colonoscopy is often done as a part of normal colorectal screening or in response to certain symptoms, such as anemia, persistent diarrhea, abdominal pain, and blood in the stool. The exam  can help screen for and diagnose medical problems, including:  Tumors.  Polyps.  Inflammation.  Areas of bleeding. Tell a health care provider about:  Any allergies you have.  All medicines you are taking, including vitamins, herbs, eye drops, creams, and over-the-counter medicines.  Any problems you or family members have had with anesthetic medicines.  Any blood disorders you have.  Any  surgeries you have had.  Any medical conditions you have.  Any problems you have had passing stool. What are the risks? Generally, this is a safe procedure. However, problems may occur, including:  Bleeding.  A tear in the intestine.  A reaction to medicines given during the exam.  Infection (rare). What happens before the procedure? Eating and drinking restrictions  Follow instructions from your health care provider about eating and drinking, which may include:  A few days before the procedure - follow a low-fiber diet. Avoid nuts, seeds, dried fruit, raw fruits, and vegetables.  1-3 days before the procedure - follow a clear liquid diet. Drink only clear liquids, such as clear broth or bouillon, black coffee or tea, clear juice, clear soft drinks or sports drinks, gelatin desert, and popsicles. Avoid any liquids that contain red or purple dye.  On the day of the procedure - do not eat or drink anything during the 2 hours before the procedure, or within the time period that your health care provider recommends. Bowel prep  If you were prescribed an oral bowel prep to clean out your colon:  Take it as told by your health care provider. Starting the day before your procedure, you will need to drink a large amount of medicated liquid. The liquid will cause you to have multiple loose stools until your stool is almost clear or light green.  If your skin or anus gets irritated from diarrhea, you may use these to relieve the irritation:  Medicated wipes, such as adult wet wipes with aloe  and vitamin E.  A skin soothing-product like petroleum jelly.  If you vomit while drinking the bowel prep, take a break for up to 60 minutes and then begin the bowel prep again. If vomiting continues and you cannot take the bowel prep without vomiting, call your health care provider. General instructions  Ask your health care provider about changing or stopping your regular medicines. This is especially important if you are taking diabetes medicines or blood thinners.  Plan to have someone take you home from the hospital or clinic. What happens during the procedure?  An IV tube may be inserted into one of your veins.  You will be given medicine to help you relax (sedative).  To reduce your risk of infection:  Your health care team will wash or sanitize their hands.  Your anal area will be washed with soap.  You will be asked to lie on your side with your knees bent.  Your health care provider will lubricate a long, thin, flexible tube. The tube will have a camera and a light on the end.  The tube will be inserted into your anus.  The tube will be gently eased through your rectum and colon.  Air will be delivered into your colon to keep it open. You may feel some pressure or cramping.  The camera will be used to take images during the procedure.  A small tissue sample may be removed from your body to be examined under a microscope (biopsy). If any potential problems are found, the tissue will be sent to a lab for testing.  If small polyps are found, your health care provider may remove them and have them checked for cancer cells.  The tube that was inserted into your anus will be slowly removed. The procedure may vary among health care providers and hospitals. What happens after the procedure?  Your blood pressure, heart rate, breathing rate, and blood oxygen level will  be monitored until the medicines you were given have worn off.  Do not drive for 24 hours after the  exam.  You may have a small amount of blood in your stool.  You may pass gas and have mild abdominal cramping or bloating due to the air that was used to inflate your colon during the exam.  It is up to you to get the results of your procedure. Ask your health care provider, or the department performing the procedure, when your results will be ready. This information is not intended to replace advice given to you by your health care provider. Make sure you discuss any questions you have with your health care provider. Document Released: 10/20/2000 Document Revised: 05/12/2016 Document Reviewed: 01/04/2016 Elsevier Interactive Patient Education  2017 [employer].

## 2016-11-08 ENCOUNTER — Encounter (HOSPITAL_COMMUNITY)
Admission: RE | Admit: 2016-11-08 | Discharge: 2016-11-08 | Disposition: A | Discharge status: Home / Self Care | Payer: Managed Care, Other (non HMO) | Source: Ambulatory Visit | Attending: Internal Medicine | Admitting: Internal Medicine

## 2016-11-08 ENCOUNTER — Encounter (HOSPITAL_COMMUNITY): Payer: Self-pay

## 2016-11-08 DIAGNOSIS — K219 Gastro-esophageal reflux disease without esophagitis: Secondary | ICD-10-CM | POA: Insufficient documentation

## 2016-11-08 DIAGNOSIS — Z01812 Encounter for preprocedural laboratory examination: Secondary | ICD-10-CM | POA: Insufficient documentation

## 2016-11-08 DIAGNOSIS — R1310 Dysphagia, unspecified: Secondary | ICD-10-CM

## 2016-11-08 DIAGNOSIS — F329 Major depressive disorder, single episode, unspecified: Secondary | ICD-10-CM | POA: Diagnosis not present

## 2016-11-08 DIAGNOSIS — Z01810 Encounter for preprocedural cardiovascular examination: Secondary | ICD-10-CM | POA: Insufficient documentation

## 2016-11-08 DIAGNOSIS — D128 Benign neoplasm of rectum: Secondary | ICD-10-CM | POA: Diagnosis not present

## 2016-11-08 DIAGNOSIS — Z9049 Acquired absence of other specified parts of digestive tract: Secondary | ICD-10-CM | POA: Diagnosis not present

## 2016-11-08 DIAGNOSIS — Z8589 Personal history of malignant neoplasm of other organs and systems: Secondary | ICD-10-CM | POA: Diagnosis not present

## 2016-11-08 DIAGNOSIS — Z1211 Encounter for screening for malignant neoplasm of colon: Secondary | ICD-10-CM | POA: Diagnosis present

## 2016-11-08 DIAGNOSIS — J45909 Unspecified asthma, uncomplicated: Secondary | ICD-10-CM | POA: Diagnosis not present

## 2016-11-08 DIAGNOSIS — Z934 Other artificial openings of gastrointestinal tract status: Secondary | ICD-10-CM | POA: Diagnosis not present

## 2016-11-08 DIAGNOSIS — Z800 Family history of malignant neoplasm of digestive organs: Secondary | ICD-10-CM | POA: Diagnosis not present

## 2016-11-08 DIAGNOSIS — K2270 Barrett's esophagus without dysplasia: Secondary | ICD-10-CM | POA: Diagnosis not present

## 2016-11-08 DIAGNOSIS — E538 Deficiency of other specified B group vitamins: Secondary | ICD-10-CM | POA: Diagnosis not present

## 2016-11-08 DIAGNOSIS — G4700 Insomnia, unspecified: Secondary | ICD-10-CM | POA: Diagnosis not present

## 2016-11-08 DIAGNOSIS — K5730 Diverticulosis of large intestine without perforation or abscess without bleeding: Secondary | ICD-10-CM | POA: Diagnosis not present

## 2016-11-08 DIAGNOSIS — F419 Anxiety disorder, unspecified: Secondary | ICD-10-CM | POA: Diagnosis not present

## 2016-11-08 DIAGNOSIS — Z980 Intestinal bypass and anastomosis status: Secondary | ICD-10-CM | POA: Diagnosis not present

## 2016-11-08 DIAGNOSIS — I10 Essential (primary) hypertension: Secondary | ICD-10-CM | POA: Diagnosis not present

## 2016-11-08 DIAGNOSIS — Z9884 Bariatric surgery status: Secondary | ICD-10-CM | POA: Diagnosis not present

## 2016-11-08 HISTORY — DX: Adverse effect of unspecified anesthetic, initial encounter: T41.45XA

## 2016-11-08 HISTORY — DX: Personal history of urinary calculi: Z87.442

## 2016-11-08 HISTORY — DX: Cardiac arrhythmia, unspecified: I49.9

## 2016-11-08 HISTORY — DX: Headache, unspecified: R51.9

## 2016-11-08 HISTORY — DX: Headache: R51

## 2016-11-08 HISTORY — DX: Other complications of anesthesia, initial encounter: T88.59XA

## 2016-11-08 HISTORY — DX: Unspecified osteoarthritis, unspecified site: M19.90

## 2016-11-08 LAB — BASIC METABOLIC PANEL
Anion gap: 6 (ref 5–15)
BUN: 15 mg/dL (ref 6–20)
CHLORIDE: 104 mmol/L (ref 101–111)
CO2: 25 mmol/L (ref 22–32)
Calcium: 8.8 mg/dL — ABNORMAL LOW (ref 8.9–10.3)
Creatinine, Ser: 0.53 mg/dL (ref 0.44–1.00)
GFR calc Af Amer: >60 mL/min (ref >60)
GFR calc non Af Amer: >60 mL/min (ref >60)
Glucose, Bld: 88 mg/dL (ref 65–99)
Potassium: 3.7 mmol/L (ref 3.5–5.1)
Sodium: 135 mmol/L (ref 135–145)

## 2016-11-08 LAB — CBC WITH DIFFERENTIAL/PLATELET
Basophils Absolute: 0 10*3/uL (ref 0.0–0.1)
Basophils Relative: 0 %
EOS ABS: 0.2 10*3/uL (ref 0.0–0.7)
Eosinophils Relative: 3 %
HCT: 41.6 % (ref 36.0–46.0)
HEMOGLOBIN: 13.4 g/dL (ref 12.0–15.0)
LYMPHS ABS: 2.3 10*3/uL (ref 0.7–4.0)
Lymphocytes Relative: 32 %
MCH: 31.4 pg (ref 26.0–34.0)
MCHC: 32.2 g/dL (ref 30.0–36.0)
MCV: 97.4 fL (ref 78.0–100.0)
MONO ABS: 0.5 10*3/uL (ref 0.1–1.0)
Monocytes Relative: 7 %
Neutro Abs: 4.1 10*3/uL (ref 1.7–7.7)
Neutrophils Relative %: 58 %
Platelets: 316 10*3/uL (ref 150–400)
RBC: 4.27 MIL/uL (ref 3.87–5.11)
RDW: 13.1 % (ref 11.5–15.5)
WBC: 7.1 10*3/uL (ref 4.0–10.5)

## 2016-11-10 ENCOUNTER — Ambulatory Visit (HOSPITAL_COMMUNITY): Payer: Managed Care, Other (non HMO) | Admitting: Anesthesiology

## 2016-11-10 ENCOUNTER — Encounter (HOSPITAL_COMMUNITY)
Admission: RE | Disposition: A | Discharge status: Home / Self Care | Payer: Self-pay | Source: Ambulatory Visit | Attending: Internal Medicine

## 2016-11-10 ENCOUNTER — Ambulatory Visit (HOSPITAL_COMMUNITY)
Admission: RE | Admit: 2016-11-10 | Discharge: 2016-11-10 | Disposition: A | Discharge status: Home / Self Care | Payer: Managed Care, Other (non HMO) | Source: Ambulatory Visit | Attending: Internal Medicine | Admitting: Internal Medicine

## 2016-11-10 ENCOUNTER — Encounter (HOSPITAL_COMMUNITY): Payer: Self-pay | Admitting: *Deleted

## 2016-11-10 DIAGNOSIS — Z8589 Personal history of malignant neoplasm of other organs and systems: Secondary | ICD-10-CM | POA: Insufficient documentation

## 2016-11-10 DIAGNOSIS — Z1211 Encounter for screening for malignant neoplasm of colon: Secondary | ICD-10-CM | POA: Diagnosis not present

## 2016-11-10 DIAGNOSIS — K5730 Diverticulosis of large intestine without perforation or abscess without bleeding: Secondary | ICD-10-CM | POA: Insufficient documentation

## 2016-11-10 DIAGNOSIS — Z9049 Acquired absence of other specified parts of digestive tract: Secondary | ICD-10-CM | POA: Insufficient documentation

## 2016-11-10 DIAGNOSIS — R1310 Dysphagia, unspecified: Secondary | ICD-10-CM | POA: Diagnosis not present

## 2016-11-10 DIAGNOSIS — J45909 Unspecified asthma, uncomplicated: Secondary | ICD-10-CM | POA: Insufficient documentation

## 2016-11-10 DIAGNOSIS — G4700 Insomnia, unspecified: Secondary | ICD-10-CM | POA: Insufficient documentation

## 2016-11-10 DIAGNOSIS — I10 Essential (primary) hypertension: Secondary | ICD-10-CM | POA: Insufficient documentation

## 2016-11-10 DIAGNOSIS — D128 Benign neoplasm of rectum: Secondary | ICD-10-CM | POA: Diagnosis not present

## 2016-11-10 DIAGNOSIS — E538 Deficiency of other specified B group vitamins: Secondary | ICD-10-CM | POA: Insufficient documentation

## 2016-11-10 DIAGNOSIS — F419 Anxiety disorder, unspecified: Secondary | ICD-10-CM | POA: Insufficient documentation

## 2016-11-10 DIAGNOSIS — Z800 Family history of malignant neoplasm of digestive organs: Secondary | ICD-10-CM | POA: Insufficient documentation

## 2016-11-10 DIAGNOSIS — F329 Major depressive disorder, single episode, unspecified: Secondary | ICD-10-CM | POA: Insufficient documentation

## 2016-11-10 DIAGNOSIS — K228 Other specified diseases of esophagus: Secondary | ICD-10-CM | POA: Diagnosis not present

## 2016-11-10 DIAGNOSIS — Z980 Intestinal bypass and anastomosis status: Secondary | ICD-10-CM | POA: Insufficient documentation

## 2016-11-10 DIAGNOSIS — Z1212 Encounter for screening for malignant neoplasm of rectum: Secondary | ICD-10-CM

## 2016-11-10 DIAGNOSIS — Z934 Other artificial openings of gastrointestinal tract status: Secondary | ICD-10-CM | POA: Insufficient documentation

## 2016-11-10 DIAGNOSIS — K2270 Barrett's esophagus without dysplasia: Secondary | ICD-10-CM | POA: Insufficient documentation

## 2016-11-10 DIAGNOSIS — K219 Gastro-esophageal reflux disease without esophagitis: Secondary | ICD-10-CM | POA: Insufficient documentation

## 2016-11-10 DIAGNOSIS — Z9884 Bariatric surgery status: Secondary | ICD-10-CM | POA: Insufficient documentation

## 2016-11-10 HISTORY — PX: ESOPHAGOGASTRODUODENOSCOPY (EGD) WITH PROPOFOL: SHX5813

## 2016-11-10 HISTORY — PX: COLONOSCOPY WITH PROPOFOL: SHX5780

## 2016-11-10 HISTORY — PX: MALONEY DILATION: SHX5535

## 2016-11-10 SURGERY — COLONOSCOPY WITH PROPOFOL
Anesthesia: Monitor Anesthesia Care

## 2016-11-10 MED ORDER — MIDAZOLAM HCL 2 MG/2ML IJ SOLN
1.0000 mg | INTRAMUSCULAR | Status: DC | PRN
  Administered 2016-11-10: 2 mg via INTRAVENOUS

## 2016-11-10 MED ORDER — PROPOFOL 10 MG/ML IV BOLUS
INTRAVENOUS | Status: AC
  Filled 2016-11-10: qty 80

## 2016-11-10 MED ORDER — FENTANYL CITRATE (PF) 100 MCG/2ML IJ SOLN
INTRAMUSCULAR | Status: AC
  Filled 2016-11-10: qty 2

## 2016-11-10 MED ORDER — MIDAZOLAM HCL 2 MG/2ML IJ SOLN
INTRAMUSCULAR | Status: AC
  Filled 2016-11-10: qty 2

## 2016-11-10 MED ORDER — CHLORHEXIDINE GLUCONATE CLOTH 2 % EX PADS: 6.0000 | MEDICATED_PAD | Freq: Once | CUTANEOUS | Status: DC

## 2016-11-10 MED ORDER — PROPOFOL 10 MG/ML IV BOLUS
INTRAVENOUS | Status: AC
  Filled 2016-11-10: qty 20

## 2016-11-10 MED ORDER — PROPOFOL 500 MG/50ML IV EMUL
INTRAVENOUS | Status: DC | PRN
  Administered 2016-11-10: 125 ug/kg/min via INTRAVENOUS
  Administered 2016-11-10 (×2): via INTRAVENOUS

## 2016-11-10 MED ORDER — LIDOCAINE VISCOUS 2 % MT SOLN
OROMUCOSAL | Status: AC
  Filled 2016-11-10: qty 15

## 2016-11-10 MED ORDER — FENTANYL CITRATE (PF) 100 MCG/2ML IJ SOLN
25.0000 ug | INTRAMUSCULAR | Status: AC | PRN
  Administered 2016-11-10 (×2): 25 ug via INTRAVENOUS

## 2016-11-10 MED ORDER — MIDAZOLAM HCL 5 MG/5ML IJ SOLN
INTRAMUSCULAR | Status: DC | PRN
  Administered 2016-11-10: 2 mg via INTRAVENOUS

## 2016-11-10 MED ORDER — LIDOCAINE VISCOUS 2 % MT SOLN
3.0000 mL | Freq: Once | OROMUCOSAL | Status: AC | PRN
  Administered 2016-11-10 (×2): 3 mL via OROMUCOSAL

## 2016-11-10 MED ORDER — LACTATED RINGERS IV SOLN
INTRAVENOUS | Status: DC
  Administered 2016-11-10: 1000 mL via INTRAVENOUS

## 2016-11-10 MED ORDER — GLYCOPYRROLATE 0.2 MG/ML IJ SOLN
INTRAMUSCULAR | Status: AC
  Filled 2016-11-10: qty 1

## 2016-11-10 NOTE — Interval H&P Note (Signed)
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 After consideration of risks, benefits and other options for treatment, the patient has consented to  Procedure(s) with comments: COLONOSCOPY WITH PROPOFOL (N/A) - 115 ESOPHAGOGASTRODUODENOSCOPY (EGD) WITH PROPOFOL (N/A) MALONEY DILATION (N/A) as a surgical intervention .  The patient's history has been reviewed, patient examined, no change in status, stable for surgery.  I have reviewed the patient's chart and labs.  Questions were answered to the patient's satisfaction.     Robert Rourk  No change. EGD/EGD as feasible/appropriate and screening colonoscopy for plan.  The risks, benefits, limitations, imponderables and alternatives regarding both EGD and colonoscopy have been reviewed with the patient. Questions have been answered. All parties agreeable.

## 2016-11-10 NOTE — Op Note (Signed)
[facility] Patient Name: Meghan Macdonald Procedure Date: 11/10/2016 1:01 PM MRN: [id number] Date of Birth: 1961/07/11 Attending MD: Norvel Richards , MD CSN: [id number] Age: 56 Admit Type: Outpatient Procedure:                Upper GI endoscopy Indications:              Dysphagia, IDA Providers:                Norvel Richards, MD, Lurline Del, RN, Purcell Nails.                            Cecil, Technician, Aram Candela Referring MD:              Medicines:                Propofol per Anesthesia Complications:            No immediate complications. Estimated Blood Loss:     Estimated blood loss was minimal. Procedure:                Pre-Anesthesia Assessment:                           - Prior to the procedure, a History and Physical                            was performed, and patient medications and                            allergies were reviewed. The patient's tolerance of                            previous anesthesia was also reviewed. The risks                            and benefits of the procedure and the sedation                            options and risks were discussed with the patient.                            All questions were answered, and informed consent                            was obtained. Prior Anticoagulants: The patient has                            taken no previous anticoagulant or antiplatelet                            agents. ASA Grade Assessment: II - A patient with                            mild systemic disease. After reviewing the risks  and benefits, the patient was deemed in                            satisfactory condition to undergo the procedure.                           After obtaining informed consent, the endoscope was                            passed under direct vision. Throughout the                            procedure, the patient's blood pressure, pulse, and                            oxygen  saturations were monitored continuously. The                            [id number] scope was introduced through the                            mouth, and advanced to the second part of duodenum.                            The upper GI endoscopy was accomplished without                            difficulty. The patient tolerated the procedure                            well. The upper GI endoscopy was accomplished                            without difficulty. The patient tolerated the                            procedure well. The patient tolerated the procedure                            well. Scope In: 1:48:37 PM Scope Out: 2:00:37 PM Total Procedure Duration: 0 hours 12 minutes 0 seconds  Findings:      Mucosal changes were found in the esophagus. accentuated undulating Z       line. No esophagitis. No stricture seen.      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 . The scope was       withdrawn. Dilation was performed with a Maloney dilator with no       resistance at 19 Fr. This was biopsied with a cold forceps for       histology. Biopsies of small bowel also taken. Impression:               - Mucosal changes in the esophagus. Biopsied.                            Dilated.                           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 Moderate Sedation:      Moderate (conscious) sedation was personally administered by an       anesthesia professional. The following parameters were monitored: oxygen       saturation, heart rate, blood pressure, respiratory rate, EKG, adequacy       of pulmonary ventilation, and response to care. Total physician       intraservice time was 34 minutes. Recommendation:           - Patient has a contact number available for                            emergencies. The signs and symptoms of potential                            delayed complications were discussed with the                            patient. Return to normal  activities tomorrow.                            Written discharge instructions were provided to the                            patient.                           - Resume previous diet.                           - Continue present medications. stop Omeprazole;                            begin Dexilant 60 mg daily                           - No repeat upper endoscopy.                           - Return to GI office in 8 weeks. see colonoscopy                            report. Procedure Code(s):        --- Professional ---                           [phone], Esophagogastroduodenoscopy, flexible,                            transoral; with biopsy, single or multiple                           43450, Dilation of esophagus, by unguided sound or                            bougie, single or multiple passes Diagnosis Code(s):        --- Professional ---  K22.8, Other specified diseases of esophagus                           Z98.0, Intestinal bypass and anastomosis status                           R13.10, Dysphagia, unspecified CPT copyright 2016 American Medical Association. All rights reserved. The codes documented in this report are preliminary and upon coder review may  be revised to meet current compliance requirements. Cristopher Estimable. Cherylene Ferrufino, MD Norvel Richards, MD 11/10/2016 2:38:45 PM This report has been signed electronically. Number of Addenda: 0

## 2016-11-10 NOTE — Transfer of Care (Signed)
Immediate Anesthesia Transfer of Care Note  Patient: Meghan Macdonald  Procedure(s) Performed: Procedure(s) with comments: COLONOSCOPY WITH PROPOFOL (N/A) - 115 ESOPHAGOGASTRODUODENOSCOPY (EGD) WITH PROPOFOL (N/A) MALONEY DILATION (N/A)  Patient Location: PACU  Anesthesia Type:MAC  Level of Consciousness: awake, alert , oriented and patient cooperative  Airway & Oxygen Therapy: Patient Spontanous Breathing and Patient connected to nasal cannula oxygen  Post-op Assessment: Report given to RN and Post -op Vital signs reviewed and stable  Post vital signs: Reviewed and stable  Last Vitals:  Vitals:   11/10/16 1315 11/10/16 1330  BP: (!) 100/58 116/65  Resp: 16 14  Temp:      Last Pain:  Vitals:   11/10/16 1221  TempSrc: Oral      Patients Stated Pain Goal: 7 (83/77/93 9688)  Complications: No apparent anesthesia complications

## 2016-11-10 NOTE — Anesthesia Preprocedure Evaluation (Signed)
Anesthesia Evaluation  Patient identified by MRN, date of birth, ID band Patient awake    Reviewed: Allergy & Precautions, NPO status , Patient's Chart, lab work & pertinent test results, reviewed documented beta blocker date and time   Airway Mallampati: I  TM Distance: >3 FB     Dental  (+) Teeth Intact   Pulmonary asthma ,    breath sounds clear to auscultation       Cardiovascular hypertension, Pt. on medications and Pt. on home beta blockers + dysrhythmias (beta blocker)  Rhythm:Regular Rate:Normal     Neuro/Psych  Headaches, PSYCHIATRIC DISORDERS Anxiety Depression    GI/Hepatic GERD  ,  Endo/Other    Renal/GU      Musculoskeletal   Abdominal   Peds  Hematology   Anesthesia Other Findings   Reproductive/Obstetrics                             Anesthesia Physical Anesthesia Plan  ASA: III  Anesthesia Plan: MAC   Post-op Pain Management:    Induction: Intravenous  Airway Management Planned: Simple Face Mask  Additional Equipment:   Intra-op Plan:   Post-operative Plan:   Informed Consent: I have reviewed the patients History and Physical, chart, labs and discussed the procedure including the risks, benefits and alternatives for the proposed anesthesia with the patient or authorized representative who has indicated his/her understanding and acceptance.     Plan Discussed with:   Anesthesia Plan Comments:         Anesthesia Quick Evaluation

## 2016-11-10 NOTE — Op Note (Signed)
[facility] Patient Name: Meghan Macdonald Procedure Date: 11/10/2016 2:06 PM MRN: [id number] Date of Birth: April 10, 1961 Attending MD: Norvel Richards , MD CSN: [id number] Age: 56 Admit Type: Outpatient Procedure:                Colonoscopy with multiple snare polypectomies Indications:              Screening for colorectal malignant neoplasm Providers:                Norvel Richards, MD, Lurline Del, RN, [facility], Technician, Aram Candela Referring MD:              Medicines:                Propofol per Anesthesia Complications:            No immediate complications. Estimated Blood Loss:     Estimated blood loss was minimal. Procedure:                Pre-Anesthesia Assessment:                           - Prior to the procedure, a History and Physical                            was performed, and patient medications and                            allergies were reviewed. The patient's tolerance of                            previous anesthesia was also reviewed. The risks                            and benefits of the procedure and the sedation                            options and risks were discussed with the patient.                            All questions were answered, and informed consent                            was obtained. Prior Anticoagulants: The patient has                            taken no previous anticoagulant or antiplatelet                            agents. ASA Grade Assessment: II - A patient with                            mild systemic disease. After reviewing the risks  and benefits, the patient was deemed in                            satisfactory condition to undergo the procedure.                           After obtaining informed consent, the colonoscope                            was passed under direct vision. Throughout the                            procedure, the patient's blood  pressure, pulse, and                            oxygen saturations were monitored continuously. The                            [id number] scope was introduced through                            the anus and advanced to the the ileocolonic                            anastomosis. The colonoscopy was performed without                            difficulty. The patient tolerated the procedure                            well. The quality of the bowel preparation was                            adequate. The terminal ileum and the rectum were                            photographed. Scope In: 2:14:36 PM Scope Out: 2:26:48 PM Scope Withdrawal Time: 0 hours 8 minutes 32 seconds  Total Procedure Duration: 0 hours 12 minutes 12 seconds  Findings:      The perianal and digital rectal examinations were normal. Status post       right hemicolectomy      Scattered diverticula were found in the sigmoid colon, descending colon       and transverse colon.      A 7 mm polyp was found in the rectum. The polyp was semi-pedunculated.       The polyp was removed with a hot snare. Resection and retrieval were       complete. Estimated blood loss: none. Estimated blood loss: none.      A 4 mm polyp was found in the rectum. The polyp was semi-pedunculated.       The polyp was removed with a cold snare. Resection and retrieval were       complete.      The exam was otherwise without abnormality on direct and retroflexion       views. Impression:               -  Diverticulosis in the sigmoid colon, in the                            descending colon and in the transverse colon.                           - One 7 mm polyp in the rectum, removed with a hot                            snare. Resected and retrieved.                           - One 4 mm polyp in the rectum, removed with a cold                            snare. Resected and retrieved. Moderate Sedation:      Moderate (conscious) sedation was  personally administered by an       anesthesia professional. The following parameters were monitored: oxygen       saturation, heart rate, blood pressure, respiratory rate, EKG, adequacy       of pulmonary ventilation, and response to care. Total physician       intraservice time was 47 minutes. Recommendation:           - Repeat colonoscopy date to be determined after                            pending pathology results are reviewed for                            surveillance based on pathology results.                           - Return to GI office in 3 months. See EGD report                           - Patient has a contact number available for                            emergencies. The signs and symptoms of potential                            delayed complications were discussed with the                            patient. Return to normal activities tomorrow.                            Written discharge instructions were provided to the                            patient.                           - Continue present medications.                           -  Patient has a contact number available for                            emergencies. The signs and symptoms of potential                            delayed complications were discussed with the                            patient. Return to normal activities tomorrow.                            Written discharge instructions were provided to the                            patient. Procedure Code(s):        --- Professional ---                           [phone], Colonoscopy, flexible; with removal of                            tumor(s), polyp(s), or other lesion(s) by snare                            technique Diagnosis Code(s):        --- Professional ---                           Z12.11, Encounter for screening for malignant                            neoplasm of colon                           K62.1, Rectal polyp                            K57.30, Diverticulosis of large intestine without                            perforation or abscess without bleeding CPT copyright 2016 American Medical Association. All rights reserved. The codes documented in this report are preliminary and upon coder review may  be revised to meet current compliance requirements. Cristopher Estimable. Stacey Sago, MD Norvel Richards, MD 11/10/2016 2:37:02 PM This report has been signed electronically. Number of Addenda: 0

## 2016-11-10 NOTE — Anesthesia Postprocedure Evaluation (Signed)
Anesthesia Post Note  Patient: Meghan Macdonald  Procedure(s) Performed: Procedure(s) (LRB): COLONOSCOPY WITH PROPOFOL (N/A) ESOPHAGOGASTRODUODENOSCOPY (EGD) WITH PROPOFOL (N/A) MALONEY DILATION (N/A)  Patient location during evaluation: PACU Anesthesia Type: MAC Level of consciousness: awake and alert and oriented Pain management: pain level controlled Vital Signs Assessment: post-procedure vital signs reviewed and stable Respiratory status: respiratory function stable, patient connected to nasal cannula oxygen and spontaneous breathing Cardiovascular status: stable Postop Assessment: no signs of nausea or vomiting Anesthetic complications: no     Last Vitals:  Vitals:   11/10/16 1315 11/10/16 1330  BP: (!) 100/58 116/65  Resp: 16 14  Temp:      Last Pain:  Vitals:   11/10/16 1221  TempSrc: Oral                 ADAMS, AMY A

## 2016-11-10 NOTE — Interval H&P Note (Signed)
History and Physical Interval Note:  11/10/2016 12:07 PM  Meghan Macdonald  has presented today for surgery, with the diagnosis of DYSPHAGIA/HISTORY OF BARRETT'S/CRC SCREENING  The various methods of treatment have been discussed with the patient and family. After consideration of risks, benefits and other options for treatment, the patient has consented to  Procedure(s) with comments: COLONOSCOPY WITH PROPOFOL (N/A) - 115 ESOPHAGOGASTRODUODENOSCOPY (EGD) WITH PROPOFOL (N/A) MALONEY DILATION (N/A) as a surgical intervention .  The patient's history has been reviewed, patient examined, no change in status, stable for surgery.  I have reviewed the patient's chart and labs.  Questions were answered to the patient's satisfaction.     Meghan Macdonald  No change; EGD and TCS per plan  The risks, benefits, limitations, imponderables and alternatives regarding both EGD and colonoscopy have been reviewed with the patient. Questions have been answered. All parties agreeable.

## 2016-11-10 NOTE — H&P (View-Only) (Signed)
Primary Care Physician:  Robyne Peers., MD  Primary Gastroenterologist:  Garfield Cornea, MD   Chief Complaint  Patient presents with  . Gastroesophageal Reflux  . 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 female here at the request of her PCP for further evaluation of GERD and screening colonoscopy. Patient states her last TCS was 10 years ago. She had open gastric bypass in 2011 in [place] and was found to have abnormality of the appendix and underwent appendectomy at that time. 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   Patient states she has had prior EGD (in the 1990s) and told she had Barrett's but subsequent EGDs it has not shown up. She complains of heartburn. She cannot come off PPI. Prozac causes heartburn. Some swallowing issues but may be because "I eat too fast". Feels like food sits in chest. She has been told her cough may be reflux related. Also takes zantac at bedtime. BM several per day. No melena, brbpr. No diarrhea.   Current Outpatient Prescriptions  Medication Sig Dispense Refill  . albuterol (PROVENTIL HFA;VENTOLIN HFA) 108 (90 BASE) MCG/ACT inhaler Inhale 1-2 puffs into the lungs every 6 (six) hours as needed for wheezing or shortness of breath.    . Cholecalciferol (VITAMIN D3) 1000 UNITS CAPS Take 1 capsule by mouth daily.    Marland Kitchen FLUoxetine HCl 60 MG TABS Take 1 tablet by mouth daily.    . fluticasone (FLONASE) 50 MCG/ACT nasal spray Place 1 spray into both nostrils as needed for allergies or rhinitis.    Marland Kitchen LORazepam (ATIVAN) 0.5 MG tablet Take 0.5 mg by mouth daily as needed. Anxiety.    . metoprolol succinate (TOPROL-XL) 100 MG 24 hr tablet Take 1 tablet by mouth daily.    . Multiple Vitamin (MULTIVITAMIN) tablet Take 1 tablet by mouth  daily.    Marland Kitchen omeprazole (PRILOSEC) 20 MG capsule Take 20 mg by mouth daily.     . ranitidine (ZANTAC) 150 MG tablet Take 150 mg by mouth daily.    . vitamin B-12 (CYANOCOBALAMIN) 50 MCG tablet Take 50 mcg by mouth daily.    Marland Kitchen zolpidem (AMBIEN) 10 MG tablet Take 10 mg by mouth at bedtime as needed. Sleep.     No current facility-administered medications for this visit.     Allergies as of 10/11/2016  . (No Known Allergies)    Past Medical History:  Diagnosis Date  . Anxiety   . Asthma   . Back pain   . Carcinoid tumor of appendix   . Depression   . Essential hypertension, benign   . GERD (gastroesophageal reflux disease)   . History of migraine headaches   . Insomnia   . Iron deficiency anemia   . Nephrolithiasis   . Vitamin B deficiency     Past Surgical History:  Procedure Laterality Date  . APPENDECTOMY  2001   carcinoid tumor, found during gastric bypass.  Marland Kitchen BREAST BIOPSY    . COLONOSCOPY WITH ESOPHAGOGASTRODUODENOSCOPY (EGD)  2008   Dr. Collene Mares: s/p right hemicolectomy, sigmoid colon diverticulosis, otherwise unremarkable. EGD unremarkable, s/p gastric bypass, No Barrett's  . CYSTOSCOPY    . GASTRIC BYPASS  2001   incidental finding of carcinoid tumor of appendix at time of surgery  with appendectomy  . REPLACEMENT TOTAL KNEE Left 2006  . Rhinologic surgery    . RIGHT OOPHORECTOMY    . TONSILLECTOMY AND ADENOIDECTOMY      Family History  Problem Relation Age of Onset  . Asthma Mother   . Hyperlipidemia Mother   . Thyroid disease Mother   . Hyperlipidemia Father   . Lung cancer Father   . Colon cancer Maternal Grandmother 80    Social History   Social History  . Marital status: Divorced    Spouse name: N/A  . Number of children: N/A  . Years of education: 12+   Occupational History  .  [employer]   Social History Main Topics  . Smoking status: Never Smoker  . Smokeless tobacco: Never Used  . Alcohol use 2.4 oz/week    4 Glasses of wine  per week     Comment: Regular alcohol intake, 5 days a week  . Drug use: No  . Sexual activity: Not on file   Other Topics Concern  . Not on file   Social History Narrative  . No narrative on file      ROS:  General: Negative for anorexia, weight loss, fever, chills, fatigue, weakness. Eyes: Negative for vision changes.  ENT: Negative for hoarseness, difficulty swallowing , nasal congestion. CV: Negative for chest pain, angina, palpitations, dyspnea on exertion, peripheral edema.  Respiratory: Negative for dyspnea at rest, dyspnea on exertion, cough, sputum, wheezing.  GI: See history of present illness. GU:  Negative for dysuria, hematuria, urinary incontinence, urinary frequency, nocturnal urination.  MS: Negative for joint pain, low back pain.  Derm: Negative for rash or itching.  Neuro: Negative for weakness, abnormal sensation, seizure, frequent headaches, memory loss, confusion.  Psych: Negative for anxiety, depression, suicidal ideation, hallucinations.  Endo: Negative for unusual weight change.  Heme: Negative for bruising or bleeding. Allergy: Negative for rash or hives.    Physical Examination:  BP 127/77   Pulse (!) 51   Temp 97.5 F (36.4 C) (Oral)   Ht 5\' 4"  (1.626 m)   Wt (!) 300 lb 9.6 oz (136.4 kg)   BMI 51.60 kg/m    General: Well-nourished, well-developed in no acute distress.  Head: Normocephalic, atraumatic.   Eyes: Conjunctiva pink, no icterus. Mouth: Oropharyngeal mucosa moist and pink , no lesions erythema or exudate. Neck: Supple without thyromegaly, masses, or lymphadenopathy.  Lungs: Clear to auscultation bilaterally.  Heart: Regular rate and rhythm, no murmurs rubs or gallops.  Abdomen: Bowel sounds are normal, nontender, nondistended, no hepatosplenomegaly or masses, no abdominal bruits or    hernia , no rebound or guarding.   Rectal: not performed Extremities: No lower extremity edema. No clubbing or deformities.  Neuro: Alert and  oriented x 4 , grossly normal neurologically.  Skin: Warm and dry, no rash or jaundice.   Psych: Alert and cooperative, normal mood and affect.  Labs: Have requested  Imaging Studies: No results found.  Impression/Plan: 56 y/o female with h/o remote gastric bypass and incidental appendectomy for carcinoid tumor who presents for GERD/dysphagia, h/o Barrett's and colon cancer screening. Notably last EGD/TCS 2008. No Barrett's then although patient reports positive for barrett's in the 1990s. Plan for EGD+/-ED/TCS in the near future for GERD/dysphagia/colon cancer screening. Plan for deep sedation in OR due to polypharmacy/frequent etoh.  I have discussed the risks, alternatives, benefits with regards to but not limited to the risk of reaction to medication, bleeding, infection, perforation and the patient is agreeable  to proceed. Written consent to be obtained.  Requested copy of recent labs. Patient carries history of IDA/B12 deficiency since her gastric bypass. Unclear if she has ever been checked for celiac disease. Consider SB bx.

## 2016-11-10 NOTE — Discharge Instructions (Addendum)
Colonoscopy Discharge Instructions  Read the instructions outlined below and refer to this sheet in the next few weeks. These discharge instructions provide you with general information on caring for yourself after you leave the hospital. Your doctor may also give you specific instructions. While your treatment has been planned according to the most current medical practices available, unavoidable complications occasionally occur. If you have any problems or questions after discharge, call Dr. Gala Romney at [phone]. ACTIVITY  You may resume your regular activity, but move at a slower pace for the next 24 hours.   Take frequent rest periods for the next 24 hours.   Walking will help get rid of the air and reduce the bloated feeling in your belly (abdomen).   No driving for 24 hours (because of the medicine (anesthesia) used during the test).    Do not sign any important legal documents or operate any machinery for 24 hours (because of the anesthesia used during the test).  NUTRITION  Drink plenty of fluids.   You may resume your normal diet as instructed by your doctor.   Begin with a light meal and progress to your normal diet. Heavy or fried foods are harder to digest and may make you feel sick to your stomach (nauseated).   Avoid alcoholic beverages for 24 hours or as instructed.  MEDICATIONS  You may resume your normal medications unless your doctor tells you otherwise.  WHAT YOU CAN EXPECT TODAY  Some feelings of bloating in the abdomen.   Passage of more gas than usual.   Spotting of blood in your stool or on the toilet paper.  IF YOU HAD POLYPS REMOVED DURING THE COLONOSCOPY:  No aspirin products for 7 days or as instructed.   No alcohol for 7 days or as instructed.   Eat a soft diet for the next 24 hours.  FINDING OUT THE RESULTS OF YOUR TEST Not all test results are available during your visit. If your test results are not back during the visit, make an appointment  with your caregiver to find out the results. Do not assume everything is normal if you have not heard from your caregiver or the medical facility. It is important for you to follow up on all of your test results.  SEEK IMMEDIATE MEDICAL ATTENTION IF:  You have more than a spotting of blood in your stool.   Your belly is swollen (abdominal distention).   You are nauseated or vomiting.   You have a temperature over 101.   You have abdominal pain or discomfort that is severe or gets worse throughout the day.    Gastroesophageal Reflux Disease, Adult Normally, food travels down the esophagus and stays in the stomach to be digested. However, when a person has gastroesophageal reflux disease (GERD), food and stomach acid move back up into the esophagus. When this happens, the esophagus becomes sore and inflamed. Over time, GERD can create small holes (ulcers) in the lining of the esophagus. What are the causes? This condition is caused by a problem with the muscle between the esophagus and the stomach (lower esophageal sphincter, or LES). Normally, the LES muscle closes after food passes through the esophagus to the stomach. When the LES is weakened or abnormal, it does not close properly, and that allows food and stomach acid to go back up into the esophagus. The LES can be weakened by certain dietary substances, medicines, and medical conditions, including:  Tobacco use.  Pregnancy.  Having a hiatal hernia.  Heavy alcohol use.  Certain foods and beverages, such as coffee, chocolate, onions, and peppermint. What increases the risk? This condition is more likely to develop in:  People who have an increased body weight.  People who have connective tissue disorders.  People who use NSAID medicines. What are the signs or symptoms? Symptoms of this condition include:  Heartburn.  Difficult or painful swallowing.  The feeling of having a lump in the throat.  Abitter taste in the  mouth.  Bad breath.  Having a large amount of saliva.  Having an upset or bloated stomach.  Belching.  Chest pain.  Shortness of breath or wheezing.  Ongoing (chronic) cough or a night-time cough.  Wearing away of tooth enamel.  Weight loss. Different conditions can cause chest pain. Make sure to see your health care provider if you experience chest pain. How is this diagnosed? Your health care provider will take a medical history and perform a physical exam. To determine if you have mild or severe GERD, your health care provider may also monitor how you respond to treatment. You may also have other tests, including:  An endoscopy toexamine your stomach and esophagus with a small camera.  A test thatmeasures the acidity level in your esophagus.  A test thatmeasures how much pressure is on your esophagus.  A barium swallow or modified barium swallow to show the shape, size, and functioning of your esophagus. How is this treated? The goal of treatment is to help relieve your symptoms and to prevent complications. Treatment for this condition may vary depending on how severe your symptoms are. Your health care provider may recommend:  Changes to your diet.  Medicine.  Surgery. Follow these instructions at home: Diet  Follow a diet as recommended by your health care provider. This may involve avoiding foods and drinks such as:  Coffee and tea (with or without caffeine).  Drinks that containalcohol.  Energy drinks and sports drinks.  Carbonated drinks or sodas.  Chocolate and cocoa.  Peppermint and mint flavorings.  Garlic and onions.  Horseradish.  Spicy and acidic foods, including peppers, chili powder, curry powder, vinegar, hot sauces, and barbecue sauce.  Citrus fruit juices and citrus fruits, such as oranges, lemons, and limes.  Tomato-based foods, such as red sauce, chili, salsa, and pizza with red sauce.  Fried and fatty foods, such as donuts,  french fries, potato chips, and high-fat dressings.  High-fat meats, such as hot dogs and fatty cuts of red and white meats, such as rib eye steak, sausage, ham, and bacon.  High-fat dairy items, such as whole milk, butter, and cream cheese.  Eat small, frequent meals instead of large meals.  Avoid drinking large amounts of liquid with your meals.  Avoid eating meals during the 2-3 hours before bedtime.  Avoid lying down right after you eat.  Do not exercise right after you eat. General instructions  Pay attention to any changes in your symptoms.  Take over-the-counter and prescription medicines only as told by your health care provider. Do not take aspirin, ibuprofen, or other NSAIDs unless your health care provider told you to do so.  Do not use any tobacco products, including cigarettes, chewing tobacco, and e-cigarettes. If you need help quitting, ask your health care provider.  Wear loose-fitting clothing. Do not wear anything tight around your waist that causes pressure on your abdomen.  Raise (elevate) the head of your bed 6 inches (15cm).  Try to reduce your stress, such as with yoga  or meditation. If you need help reducing stress, ask your health care provider.  If you are overweight, reduce your weight to an amount that is healthy for you. Ask your health care provider for guidance about a safe weight loss goal.  Keep all follow-up visits as told by your health care provider. This is important. Contact a health care provider if:  You have new symptoms.  You have unexplained weight loss.  You have difficulty swallowing, or it hurts to swallow.  You have wheezing or a persistent cough.  Your symptoms do not improve with treatment.  You have a hoarse voice. Get help right away if:  You have pain in your arms, neck, jaw, teeth, or back.  You feel sweaty, dizzy, or light-headed.  You have chest pain or shortness of breath.  You vomit and your vomit looks like  blood or coffee grounds.  You faint.  Your stool is bloody or black.  You cannot swallow, drink, or eat. This information is not intended to replace advice given to you by your health care provider. Make sure you discuss any questions you have with your health care provider. Document Released: 08/02/2005 Document Revised: 03/22/2016 Document Reviewed: 02/17/2015 Elsevier Interactive Patient Education  2017 [employer].   Colon Polyps Introduction Polyps are tissue growths inside the body. Polyps can grow in many places, including the large intestine (colon). A polyp may be a round bump or a mushroom-shaped growth. You could have one polyp or several. Most colon polyps are noncancerous (benign). However, some colon polyps can become cancerous over time. What are the causes? The exact cause of colon polyps is not known. What increases the risk? This condition is more likely to develop in people who:  Have a family history of colon cancer or colon polyps.  Are older than 56 or older than 45 if they are African American.  Have inflammatory bowel disease, such as ulcerative colitis or Crohn disease.  Are overweight.  Smoke cigarettes.  Do not get enough exercise.  Drink too much alcohol.  Eat a diet that is:  High in fat and red meat.  Low in fiber.  Had childhood cancer that was treated with abdominal radiation. What are the signs or symptoms? Most polyps do not cause symptoms. If you have symptoms, they may include:  Blood coming from your rectum when having a bowel movement.  Blood in your stool.The stool may look dark red or black.  A change in bowel habits, such as constipation or diarrhea. How is this diagnosed? This condition is diagnosed with a colonoscopy. This is a procedure that uses a lighted, flexible scope to look at the inside of your colon. How is this treated? Treatment for this condition involves removing any polyps that are found. Those polyps  will then be tested for cancer. If cancer is found, your health care provider will talk to you about options for colon cancer treatment. Follow these instructions at home: Diet  Eat plenty of fiber, such as fruits, vegetables, and whole grains.  Eat foods that are high in calcium and vitamin D, such as milk, cheese, yogurt, eggs, liver, fish, and broccoli.  Limit foods high in fat, red meats, and processed meats, such as hot dogs, sausage, bacon, and lunch meats.  Maintain a healthy weight, or lose weight if recommended by your health care provider. General instructions  Do not smoke cigarettes.  Do not drink alcohol excessively.  Keep all follow-up visits as told by your health  care provider. This is important. This includes keeping regularly scheduled colonoscopies. Talk to your health care provider about when you need a colonoscopy.  Exercise every day or as told by your health care provider. Contact a health care provider if:  You have new or worsening bleeding during a bowel movement.  You have new or increased blood in your stool.  You have a change in bowel habits.  You unexpectedly lose weight. This information is not intended to replace advice given to you by your health care provider. Make sure you discuss any questions you have with your health care provider. Document Released: 07/19/2004 Document Revised: 03/30/2016 Document Reviewed: 09/13/2015  2017 Elsevier  Diverticulosis Diverticulosis is the condition that develops when small pouches (diverticula) form in the wall of your colon. Your colon, or large intestine, is where water is absorbed and stool is formed. The pouches form when the inside layer of your colon pushes through weak spots in the outer layers of your colon. CAUSES  No one knows exactly what causes diverticulosis. RISK FACTORS  Being older than 92. Your risk for this condition increases with age. Diverticulosis is rare in people younger than 40  years. By age 71, almost everyone has it.  Eating a low-fiber diet.  Being frequently constipated.  Being overweight.  Not getting enough exercise.  Smoking.  Taking over-the-counter pain medicines, like aspirin and ibuprofen. SYMPTOMS  Most people with diverticulosis do not have symptoms. DIAGNOSIS  Because diverticulosis often has no symptoms, health care providers often discover the condition during an exam for other colon problems. In many cases, a health care provider will diagnose diverticulosis while using a flexible scope to examine the colon (colonoscopy). TREATMENT  If you have never developed an infection related to diverticulosis, you may not need treatment. If you have had an infection before, treatment may include:  Eating more fruits, vegetables, and grains.  Taking a fiber supplement.  Taking a live bacteria supplement (probiotic).  Taking medicine to relax your colon. HOME CARE INSTRUCTIONS   Drink at least 6-8 glasses of water each day to prevent constipation.  Try not to strain when you have a bowel movement.  Keep all follow-up appointments. If you have had an infection before:  Increase the fiber in your diet as directed by your health care provider or dietitian.  Take a dietary fiber supplement if your health care provider approves.  Only take medicines as directed by your health care provider. SEEK MEDICAL CARE IF:   You have abdominal pain.  You have bloating.  You have cramps.  You have not gone to the bathroom in 3 days. SEEK IMMEDIATE MEDICAL CARE IF:   Your pain gets worse.  Yourbloating becomes very bad.  You have a fever or chills, and your symptoms suddenly get worse.  You begin vomiting.  You have bowel movements that are bloody or black. MAKE SURE YOU:  Understand these instructions.  Will watch your condition.  Will get help right away if you are not doing well or get worse. This information is not intended to  replace advice given to you by your health care provider. Make sure you discuss any questions you have with your health care provider. Document Released: 07/20/2004 Document Revised: 10/28/2013 Document Reviewed: 09/17/2013 Elsevier Interactive Patient Education  2017 [employer].   GERD information provided  Colon diverticulosis and polyp information provided  Stop omeprazole; begin Dexilant 60 mg daily  Further recommendations to follow pending review of pathology report

## 2016-11-13 ENCOUNTER — Encounter (HOSPITAL_COMMUNITY): Payer: Self-pay | Admitting: Internal Medicine

## 2016-11-13 NOTE — Addendum Note (Signed)
Addendum  created 11/13/16 [id number] by Mickel Baas, CRNA   Charge Capture section accepted

## 2016-11-18 ENCOUNTER — Encounter: Payer: Self-pay | Admitting: Internal Medicine

## 2016-11-20 ENCOUNTER — Encounter: Payer: Self-pay | Admitting: Internal Medicine

## 2016-11-20 ENCOUNTER — Telehealth: Payer: Self-pay

## 2016-11-20 NOTE — Telephone Encounter (Signed)
Per RMR-  Send letter to patient.  Send copy of letter with path to referring provider and PCP. Offer ov w extender for ida in coming several weeks if not already done

## 2016-11-20 NOTE — Telephone Encounter (Signed)
Letter mailed to the pt. 

## 2016-11-20 NOTE — Telephone Encounter (Signed)
APPT MADE AND LETTER SENT  °

## 2016-12-01 ENCOUNTER — Other Ambulatory Visit: Payer: Self-pay

## 2016-12-01 DIAGNOSIS — R748 Abnormal levels of other serum enzymes: Secondary | ICD-10-CM

## 2016-12-11 ENCOUNTER — Encounter: Payer: Self-pay | Admitting: Gastroenterology

## 2016-12-11 ENCOUNTER — Ambulatory Visit (INDEPENDENT_AMBULATORY_CARE_PROVIDER_SITE_OTHER): Payer: Managed Care, Other (non HMO) | Admitting: Gastroenterology

## 2016-12-11 DIAGNOSIS — R7989 Other specified abnormal findings of blood chemistry: Secondary | ICD-10-CM | POA: Diagnosis not present

## 2016-12-11 DIAGNOSIS — Z86010 Personal history of colonic polyps: Secondary | ICD-10-CM | POA: Insufficient documentation

## 2016-12-11 DIAGNOSIS — K2100 Gastro-esophageal reflux disease with esophagitis, without bleeding: Secondary | ICD-10-CM

## 2016-12-11 DIAGNOSIS — R945 Abnormal results of liver function studies: Secondary | ICD-10-CM

## 2016-12-11 LAB — HEPATIC FUNCTION PANEL
ALT: 16 U/L (ref 6–29)
AST: 17 U/L (ref 10–35)
Albumin: 3.7 g/dL (ref 3.6–5.1)
Alkaline Phosphatase: 109 U/L (ref 33–130)
Bilirubin, Direct: 0.2 mg/dL (ref <=0.2)
Indirect Bilirubin: 0.9 mg/dL (ref 0.2–1.2)
TOTAL PROTEIN: 6.1 g/dL (ref 6.1–8.1)
Total Bilirubin: 1.1 mg/dL (ref 0.2–1.2)

## 2016-12-11 MED ORDER — RABEPRAZOLE SODIUM 20 MG PO TBEC: 20.0000 mg | DELAYED_RELEASE_TABLET | Freq: Every day | ORAL | 11 refills | Status: DC

## 2016-12-11 NOTE — Assessment & Plan Note (Signed)
Due for surveillance colonoscopy January 2023.

## 2016-12-11 NOTE — Patient Instructions (Addendum)
1. Stop omeprazole. Start aciphex once daily before breakfast. rx sent to your pharmacy.  2. Call with ongoing problems.  3. Please have your labs done. We will contacted with results as available. 4. Return to the office in one year or sooner if needed. 5. Next colonoscopy January 2023.

## 2016-12-11 NOTE — Assessment & Plan Note (Signed)
Overall swallowing is improved. She has epigastric burning a few days per week, worse at work. Seems to be better if she eats. Recommend eating small frequent meals/snacks. Change PPI to AcipHex 20 mg daily. I'll in a few weeks with a progress report if ongoing symptoms. Otherwise return to the office in one year

## 2016-12-11 NOTE — Progress Notes (Signed)
CC'ED TO PCP 

## 2016-12-11 NOTE — Assessment & Plan Note (Signed)
Repeat labs today

## 2016-12-11 NOTE — Progress Notes (Signed)
Primary Care Physician: Robyne Peers., MD  Primary Gastroenterologist:  Garfield Cornea, MD   Chief Complaint  Patient presents with  . Gastroesophageal Reflux    pt states she is also to find out about TCS done 11/10/16  . Anemia    IDA    HPI: Meghan Macdonald is a 56 y.o. female here for follow up of EGD/TCS done on 11/10/16 for dysphagia, IDA, screening colonoscopy. 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 She did not require any adjuvant treatment. She was followed by hematology for quite some time. States she was told she had Barrett's esophagus in the 1990s but subsequent EGDs were unremarkable. Recent procedures revealed mucosal changes in the esophagus, as any weighted undulating Z line. No evidence of Barrett's. 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 Small bowel biopsy negative for celiac. Colonoscopy she had right hemicolectomy, scattered diverticula, 2 polyps removed from the rectum one was a tubular adenoma. Plans for repeat colonoscopy in 5 years.  No significant difference with Dexilant. Works about the same as Financial controller. Swallowing some better. Still some issues with Bread/rice. She thinks it may be more a symptom of feeling not too quickly rather than swallowing.Relieved to know no significant findings. Continues to have 4-5 loose stools daily which is her normal. Occasionally takes Imodium if needed. Heartburn controlled 50% of the time. About 3-4 days per week she has burning sensation in the epigastrium. Seems to be less of an issue when she is off of work and able to take her time eating and eat meals on a regular basis. Previously failed Nexium. Currently taking Zantac only on occasion. No melena rectal bleeding. She is going to have her labs done today to follow-up on abnormal LFTs.  Trying to lose weight. Plans to cut out sweets. No longer drinks alcohol.    Current Outpatient  Prescriptions  Medication Sig Dispense Refill  . acetaminophen (TYLENOL) 500 MG tablet Take 500 mg by mouth every 6 (six) hours as needed (for pain/headache.).    Marland Kitchen albuterol (PROVENTIL HFA;VENTOLIN HFA) 108 (90 BASE) MCG/ACT inhaler Inhale 1-2 puffs into the lungs every 6 (six) hours as needed for wheezing or shortness of breath.    . Bepotastine Besilate (BEPREVE) 1.5 % SOLN Place 1-2 drops into both eyes 2 (two) times daily as needed (for itchy/irritated eyes).    . cholecalciferol (VITAMIN D) 1000 units tablet Take 1,000 Units by mouth daily.    Marland Kitchen FLUoxetine (PROZAC) 20 MG capsule Take 60 mg by mouth daily.    . fluticasone (FLONASE) 50 MCG/ACT nasal spray Place 1 spray into both nostrils daily as needed for allergies or rhinitis.     . metoprolol succinate (TOPROL-XL) 100 MG 24 hr tablet Take 100 mg by mouth daily.     . Multiple Vitamin (MULTIVITAMIN) tablet Take 1 tablet by mouth daily.    . naproxen sodium (ANAPROX) 220 MG tablet Take 220-440 mg by mouth 2 (two) times daily as needed (for pain/headache.).    Marland Kitchen omeprazole (PRILOSEC) 20 MG capsule Take 20 mg by mouth daily.     . ranitidine (ZANTAC) 150 MG tablet Take 150 mg by mouth daily as needed (for persistant heartburn/acid reflux.).     Marland Kitchen vitamin B-12 (CYANOCOBALAMIN) 50 MCG tablet Take 50 mcg by mouth daily.    Marland Kitchen zolpidem (AMBIEN) 10 MG tablet Take 10 mg by mouth at bedtime as needed for  sleep. Sleep.      No current facility-administered medications for this visit.     Allergies as of 12/11/2016 - Review Complete 12/11/2016  Allergen Reaction Noted  . Azithromycin Other (See Comments) 03/10/2015    ROS:  General: Negative for anorexia, weight loss, fever, chills, fatigue, weakness. ENT: Negative for hoarseness, difficulty swallowing , nasal congestion. CV: Negative for chest pain, angina, palpitations, dyspnea on exertion, peripheral edema.  Respiratory: Negative for dyspnea at rest, dyspnea on exertion, cough, sputum,  wheezing.  GI: See history of present illness. GU:  Negative for dysuria, hematuria, urinary incontinence, urinary frequency, nocturnal urination.  Endo: Negative for unusual weight change.    Physical Examination:   BP 129/79   Pulse 64   Temp 97.7 F (36.5 C) (Other (Comment))   Ht 5\' 4"  (1.626 m)   Wt (!) 312 lb 6.4 oz (141.7 kg)   BMI 53.62 kg/m   General: Well-nourished, well-developed in no acute distress.  Eyes: No icterus. Mouth: Oropharyngeal mucosa moist and pink , no lesions erythema or exudate. Lungs: Clear to auscultation bilaterally.  Heart: Regular rate and rhythm, no murmurs rubs or gallops.  Abdomen: Bowel sounds are normal, nontender,  obese, no hepatosplenomegaly or masses, no abdominal bruits or hernia , no rebound or guarding.   Extremities: No lower extremity edema. No clubbing or deformities. Neuro: Alert and oriented x 4   Skin: Warm and dry, no jaundice.   Psych: Alert and cooperative, normal mood and affect.

## 2016-12-12 NOTE — Progress Notes (Signed)
Please let patient know her LFTs are now normal. She should have them checked at least yearly with PCP. She is at risk for fatty liver. Please provide her with these instructions.  Instructions for fatty liver: Recommend 1-2# weight loss per week until ideal body weight through exercise & diet. Low fat/cholesterol diet.   Avoid sweets, sodas, fruit juices, sweetened beverages like tea, etc. Gradually increase exercise from 15 min daily up to 1 hr per day 5 days/week. Limit alcohol use.

## 2017-01-18 ENCOUNTER — Ambulatory Visit: Payer: Managed Care, Other (non HMO) | Admitting: Nurse Practitioner

## 2017-02-19 DIAGNOSIS — M25562 Pain in left knee: Secondary | ICD-10-CM | POA: Insufficient documentation

## 2017-02-19 DIAGNOSIS — Z9884 Bariatric surgery status: Secondary | ICD-10-CM | POA: Insufficient documentation

## 2017-02-19 DIAGNOSIS — M25561 Pain in right knee: Secondary | ICD-10-CM | POA: Insufficient documentation

## 2017-04-23 ENCOUNTER — Encounter: Payer: Self-pay | Admitting: Gastroenterology

## 2017-11-05 ENCOUNTER — Other Ambulatory Visit: Payer: Self-pay | Admitting: Cardiovascular Disease

## 2017-11-05 MED ORDER — METOPROLOL SUCCINATE ER 100 MG PO TB24: 100.0000 mg | ORAL_TABLET | Freq: Every day | ORAL | 0 refills | Status: DC

## 2017-11-05 NOTE — Telephone Encounter (Signed)
Needs refill on Metoprolol Succinate 100mg  sent to [facility] [address] [facility] / tg

## 2017-11-05 NOTE — Telephone Encounter (Signed)
Not seen since 2015, made apt with MD, will fill 30 day rx.Future refills will be after MD visit

## 2017-11-13 ENCOUNTER — Encounter: Payer: Self-pay | Admitting: Internal Medicine

## 2017-11-28 ENCOUNTER — Ambulatory Visit: Payer: Managed Care, Other (non HMO) | Admitting: Cardiovascular Disease

## 2017-11-30 ENCOUNTER — Ambulatory Visit (INDEPENDENT_AMBULATORY_CARE_PROVIDER_SITE_OTHER): Payer: Managed Care, Other (non HMO) | Admitting: Cardiovascular Disease

## 2017-11-30 ENCOUNTER — Encounter: Payer: Self-pay | Admitting: Cardiovascular Disease

## 2017-11-30 VITALS — BP 104/70 | HR 58 | Ht 64.0 in | Wt 304.0 lb

## 2017-11-30 DIAGNOSIS — I10 Essential (primary) hypertension: Secondary | ICD-10-CM | POA: Diagnosis not present

## 2017-11-30 DIAGNOSIS — I471 Supraventricular tachycardia: Secondary | ICD-10-CM

## 2017-11-30 MED ORDER — DILTIAZEM HCL ER COATED BEADS 120 MG PO CP24: 120.0000 mg | ORAL_CAPSULE | Freq: Every day | ORAL | 4 refills | Status: DC

## 2017-11-30 NOTE — Patient Instructions (Signed)
Your physician wants you to follow-up in:  [place] will receive a reminder letter in the mail two months in advance. If you don't receive a letter, please call our office to schedule the follow-up appointment.     STOP Toprol   START Cardizem CD 120 mg daily     No lab work or tests ordered today.      Thank you for choosing [facility] !

## 2017-11-30 NOTE — Addendum Note (Signed)
Addended by: Barbarann Ehlers A on: 11/30/2017 05:12 PM   Modules accepted: Orders

## 2017-11-30 NOTE — Progress Notes (Signed)
CARDIOLOGY CONSULT NOTE  Patient ID: Meghan Macdonald MRN: [id number] DOB/AGE: [birth year] 57 y.o.  Admit date: (Not on file) Primary Physician: Robyne Peers, MD Referring Physician: Robyne Peers, MD  Reason for Consultation: Paroxysmal SVT  HPI: Meghan Macdonald is a 57 y.o. female who is being seen today for the evaluation of paroxysmal SVT at the request of Robyne Peers, MD.   I previously evaluated her on 02/27/14.  She has a history of chest pain related to GERD as well as hypertension.  She is also morbidly obese.  She underwent a low risk nuclear stress test on 10/17/13, LVEF 72%.  If there is no clear evidence of myocardial scar or ischemia.  She seldom has palpitations.  She has had a nonproductive cough for the past 2 or 3 months.  She denies fevers.  She denies chest pain, orthopnea, paroxysmal nocturnal dyspnea, dizziness, and leg swelling.  She does feel somewhat fatigued after taking metoprolol and wonders if she can be switched to something else.  ECG performed today which I reviewed demonstrates sinus bradycardia, 49 bpm, nonspecific T wave abnormalities, and late R wave transition.  There is low voltage.  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  She has 1 daughter in her 34s.    Allergies  Allergen Reactions  . Azithromycin Other (See Comments)    Upset stomach    Current Outpatient Medications  Medication Sig Dispense Refill  . acetaminophen (TYLENOL) 500 MG tablet Take 500 mg by mouth every 6 (six) hours as needed (for pain/headache.).    Marland Kitchen albuterol (PROVENTIL HFA;VENTOLIN HFA) 108 (90 BASE) MCG/ACT inhaler Inhale 1-2 puffs into the lungs every 6 (six) hours as needed for wheezing or shortness of breath.    . cholecalciferol (VITAMIN D) 1000 units tablet Take 1,000 Units by mouth daily.    . DULoxetine (CYMBALTA) 60 MG capsule Take by mouth.    . fluticasone (FLONASE) 50 MCG/ACT nasal spray Place 1 spray into  both nostrils daily as needed for allergies or rhinitis.     . metoprolol succinate (TOPROL-XL) 100 MG 24 hr tablet Take 1 tablet (100 mg total) by mouth daily. 30 tablet 0  . Multiple Vitamin (MULTIVITAMIN) tablet Take 1 tablet by mouth daily.    . naproxen sodium (ANAPROX) 220 MG tablet Take 220-440 mg by mouth 2 (two) times daily as needed (for pain/headache.).    Marland Kitchen RABEprazole (ACIPHEX) 20 MG tablet Take 1 tablet (20 mg total) by mouth daily before breakfast. 30 tablet 11  . vitamin B-12 (CYANOCOBALAMIN) 50 MCG tablet Take 50 mcg by mouth daily.    Marland Kitchen zolpidem (AMBIEN) 10 MG tablet Take 10 mg by mouth at bedtime as needed for sleep. Sleep.      No current facility-administered medications for this visit.     Past Medical History:  Diagnosis Date  . Anxiety   . Arthritis    knees  . Asthma    exertion or exteme cold.  . Back pain   . Carcinoid tumor of appendix   . Complication of anesthesia    SOB after second surgery 4 days after gastric bypass  . Depression   . Dysrhythmia    idiopathic arthy; had plapitations since a child. On metoprolol for 9 years;  . Essential hypertension, benign   . GERD (gastroesophageal reflux disease)   . Headache   . History of kidney stones   .  History of migraine headaches   . Insomnia   . Iron deficiency anemia   . Vitamin B deficiency     Past Surgical History:  Procedure Laterality Date  . APPENDECTOMY  2001   carcinoid tumor, found during gastric bypass.  Marland Kitchen BREAST BIOPSY Right   . COLONOSCOPY WITH ESOPHAGOGASTRODUODENOSCOPY (EGD)  2008   Dr. Collene Mares: s/p right hemicolectomy, sigmoid colon diverticulosis, otherwise unremarkable. EGD unremarkable, s/p gastric bypass, No Barrett's  . COLONOSCOPY WITH PROPOFOL N/A 11/10/2016   Procedure: COLONOSCOPY WITH PROPOFOL;  Surgeon: Daneil Dolin, MD;  [facility];  Service: Endoscopy;  Laterality: N/A;  115  . CYSTOSCOPY    . ESOPHAGOGASTRODUODENOSCOPY (EGD) WITH PROPOFOL N/A 11/10/2016    Dr. 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 Esophagus dilated per history of dysphagia. Colonoscopy showed status post right hemicolectomy, scattered diverticula, 2 polyps removed from the rectum, one tubular adenoma. Next colonoscopy 5 years, January 2023  . GASTRIC BYPASS  2001   incidental finding of carcinoid tumor of appendix at time of surgery with appendectomy  . MALONEY DILATION N/A 11/10/2016   Procedure: Venia Minks DILATION;  Surgeon: Daneil Dolin, MD;  Location: [facility];  Service: Endoscopy;  Laterality: N/A;  . REPLACEMENT TOTAL KNEE Left 2006  . Rhinologic surgery    . RIGHT OOPHORECTOMY     removal of mesentary and part of colon and uterus with removal of appendix; 4 days after gastric bypass  . TONSILLECTOMY AND ADENOIDECTOMY      Social History   Socioeconomic History  . Marital status: Divorced    Spouse name: Not on file  . Number of children: Not on file  . Years of education: 12+  . Highest education level: Not on file  Social Needs  . Financial resource strain: Not on file  . Food insecurity - worry: Not on file  . Food insecurity - inability: Not on file  . Transportation needs - medical: Not on file  . Transportation needs - non-medical: Not on file  Occupational History    Employer: [employer]  Tobacco Use  . Smoking status: Never Smoker  . Smokeless tobacco: Never Used  Substance and Sexual Activity  . Alcohol use: Yes    Alcohol/week: 1.2 oz    Types: 2 Glasses of wine per week    Comment: Regular alcohol intake, 5 days a week. Quit December 2017  . Drug use: No  . Sexual activity: No    Birth control/protection: None  Other Topics Concern  . Not on file  Social History Narrative  . Not on file     No family history of premature CAD in 1st degree relatives.  Current Meds  Medication Sig  .  acetaminophen (TYLENOL) 500 MG tablet Take 500 mg by mouth every 6 (six) hours as needed (for pain/headache.).  Marland Kitchen albuterol (PROVENTIL HFA;VENTOLIN HFA) 108 (90 BASE) MCG/ACT inhaler Inhale 1-2 puffs into the lungs every 6 (six) hours as needed for wheezing or shortness of breath.  . cholecalciferol (VITAMIN D) 1000 units tablet Take 1,000 Units by mouth daily.  . DULoxetine (CYMBALTA) 60 MG capsule Take by mouth.  . fluticasone (FLONASE) 50 MCG/ACT nasal spray Place 1 spray into both nostrils daily as needed for allergies or rhinitis.   . metoprolol succinate (TOPROL-XL) 100 MG 24 hr tablet Take 1 tablet (100 mg total) by  mouth daily.  . Multiple Vitamin (MULTIVITAMIN) tablet Take 1 tablet by mouth daily.  . naproxen sodium (ANAPROX) 220 MG tablet Take 220-440 mg by mouth 2 (two) times daily as needed (for pain/headache.).  Marland Kitchen RABEprazole (ACIPHEX) 20 MG tablet Take 1 tablet (20 mg total) by mouth daily before breakfast.  . vitamin B-12 (CYANOCOBALAMIN) 50 MCG tablet Take 50 mcg by mouth daily.  Marland Kitchen zolpidem (AMBIEN) 10 MG tablet Take 10 mg by mouth at bedtime as needed for sleep. Sleep.       Review of systems complete and found to be negative unless listed above in HPI    Physical exam Blood pressure 104/70, pulse (!) 58, height 5\' 4"  (1.626 m), weight (!) 304 lb (137.9 kg), SpO2 95 %. General: NAD Neck: No JVD, no thyromegaly or thyroid nodule.  Lungs: Clear to auscultation bilaterally with normal respiratory effort. CV: Nondisplaced PMI. Bradycardic, regular rhythm, normal S1/S2, no S3/S4, no murmur.  No peripheral edema.  No carotid bruit.    Abdomen: Protuberant.  Skin: Intact without lesions or rashes.  Neurologic: Alert and oriented x 3.  Psych: Normal affect. Extremities: No clubbing or cyanosis.  HEENT: Normal.   ECG: Most recent ECG reviewed.   Labs: Lab Results  Component Value Date/Time   K 3.7 11/08/2016 01:48 PM   BUN 15 11/08/2016 01:48 PM   CREATININE 0.53  11/08/2016 01:48 PM   ALT 16 12/11/2016 09:33 AM   HGB 13.4 11/08/2016 01:48 PM   HGB 13.8 09/22/2009 04:25 PM     Lipids: No results found for: LDLCALC, LDLDIRECT, CHOL, TRIG, HDL      ASSESSMENT AND PLAN:  1.  Paroxysmal SVT: Currently on Toprol-XL 100 mg daily.  She feels somewhat fatigued with this so I will try long-acting diltiazem 120 overall she is symptomatically stable.  2.  Hypertension: Blood pressure is normal.  I will monitor her blood pressure given change in medication from metoprolol succinate to long-acting diltiazem.    Disposition: Follow up in 1 year  Signed: Kate Sable, M.D., F.A.C.C.  11/30/2017, 4:51 PM

## 2017-12-02 ENCOUNTER — Other Ambulatory Visit: Payer: Self-pay | Admitting: Gastroenterology

## 2017-12-03 ENCOUNTER — Other Ambulatory Visit: Payer: Self-pay

## 2017-12-04 MED ORDER — RABEPRAZOLE SODIUM 20 MG PO TBEC: 20.0000 mg | DELAYED_RELEASE_TABLET | Freq: Every day | ORAL | 11 refills | Status: DC

## 2017-12-05 ENCOUNTER — Other Ambulatory Visit: Payer: Self-pay | Admitting: Cardiovascular Disease

## 2017-12-12 ENCOUNTER — Telehealth: Payer: Self-pay | Admitting: Cardiovascular Disease

## 2017-12-12 MED ORDER — DILTIAZEM HCL ER COATED BEADS 180 MG PO CP24: 180.0000 mg | ORAL_CAPSULE | Freq: Every day | ORAL | 3 refills | Status: DC

## 2017-12-12 NOTE — Telephone Encounter (Signed)
Pt reports HR at rest is between 71-76. She states that the episodes of palpitations last from 5-30 mins. Notified pt of the increase in diltiazem to 180 mg.

## 2017-12-12 NOTE — Telephone Encounter (Signed)
What is her pulse? If it is > 70 bpm, can increase diltiazem to 180 mg daily.

## 2017-12-12 NOTE — Telephone Encounter (Signed)
Called pt no answer. Left msg to call back.  

## 2017-12-12 NOTE — Telephone Encounter (Signed)
Returned pt call. She complains that she is having a lot more palpitations on the cardizem 120. She asks if her dose could be increased. Please advise.

## 2017-12-12 NOTE — Telephone Encounter (Signed)
Please give pt a call concerning her med changes

## 2018-01-31 ENCOUNTER — Telehealth: Payer: Self-pay | Admitting: Cardiovascular Disease

## 2018-01-31 NOTE — Telephone Encounter (Signed)
That is fine 

## 2018-01-31 NOTE — Telephone Encounter (Signed)
Returned pt call. She states that she is having more problems with her heart racing since stopping the metoprolol. She stated she would rather go back on the metoprolol and try working through the side effects. Please advise.

## 2018-01-31 NOTE — Telephone Encounter (Signed)
Please give pt a call --doesn't think the diltiazem (CARDIZEM CD) 180 MG 24 hr capsule [[id number]]  Is working and would like to go back to the Metoprolol she was taking before-- can be reached @ [phone]

## 2018-02-01 MED ORDER — METOPROLOL SUCCINATE ER 100 MG PO TB24: 100.0000 mg | ORAL_TABLET | Freq: Every day | ORAL | 3 refills | Status: DC

## 2018-02-01 NOTE — Addendum Note (Signed)
Addended by: Barbarann Ehlers A on: 02/01/2018 11:51 AM   Modules accepted: Orders

## 2018-02-01 NOTE — Telephone Encounter (Signed)
I spoke with pt, e-scribed to [facility] Torprol XL 100 mg daily, #90 with RF:3

## 2018-02-01 NOTE — Telephone Encounter (Signed)
LMTCB-cc 

## 2018-02-06 DIAGNOSIS — J302 Other seasonal allergic rhinitis: Secondary | ICD-10-CM | POA: Insufficient documentation

## 2018-06-10 DIAGNOSIS — R911 Solitary pulmonary nodule: Secondary | ICD-10-CM | POA: Insufficient documentation

## 2018-09-03 ENCOUNTER — Other Ambulatory Visit: Payer: Self-pay | Admitting: Family Medicine

## 2018-09-03 DIAGNOSIS — Z1231 Encounter for screening mammogram for malignant neoplasm of breast: Secondary | ICD-10-CM

## 2018-10-11 ENCOUNTER — Ambulatory Visit: Payer: Managed Care, Other (non HMO)

## 2018-10-12 ENCOUNTER — Emergency Department (HOSPITAL_BASED_OUTPATIENT_CLINIC_OR_DEPARTMENT_OTHER)
Admission: EM | Admit: 2018-10-12 | Discharge: 2018-10-12 | Disposition: A | Discharge status: Home / Self Care | Payer: Managed Care, Other (non HMO) | Attending: Emergency Medicine | Admitting: Emergency Medicine

## 2018-10-12 ENCOUNTER — Encounter (HOSPITAL_BASED_OUTPATIENT_CLINIC_OR_DEPARTMENT_OTHER): Payer: Self-pay | Admitting: *Deleted

## 2018-10-12 ENCOUNTER — Other Ambulatory Visit: Payer: Self-pay

## 2018-10-12 DIAGNOSIS — F329 Major depressive disorder, single episode, unspecified: Secondary | ICD-10-CM | POA: Diagnosis not present

## 2018-10-12 DIAGNOSIS — I10 Essential (primary) hypertension: Secondary | ICD-10-CM | POA: Diagnosis not present

## 2018-10-12 DIAGNOSIS — E860 Dehydration: Secondary | ICD-10-CM | POA: Insufficient documentation

## 2018-10-12 DIAGNOSIS — Z9884 Bariatric surgery status: Secondary | ICD-10-CM | POA: Insufficient documentation

## 2018-10-12 DIAGNOSIS — F419 Anxiety disorder, unspecified: Secondary | ICD-10-CM | POA: Insufficient documentation

## 2018-10-12 DIAGNOSIS — J45909 Unspecified asthma, uncomplicated: Secondary | ICD-10-CM | POA: Insufficient documentation

## 2018-10-12 DIAGNOSIS — M7918 Myalgia, other site: Secondary | ICD-10-CM | POA: Diagnosis present

## 2018-10-12 DIAGNOSIS — Z96651 Presence of right artificial knee joint: Secondary | ICD-10-CM | POA: Insufficient documentation

## 2018-10-12 DIAGNOSIS — R197 Diarrhea, unspecified: Secondary | ICD-10-CM

## 2018-10-12 DIAGNOSIS — E876 Hypokalemia: Secondary | ICD-10-CM | POA: Insufficient documentation

## 2018-10-12 DIAGNOSIS — Z79899 Other long term (current) drug therapy: Secondary | ICD-10-CM | POA: Insufficient documentation

## 2018-10-12 HISTORY — DX: Irritable bowel syndrome, unspecified: K58.9

## 2018-10-12 LAB — URINALYSIS, ROUTINE W REFLEX MICROSCOPIC
Glucose, UA: NEGATIVE mg/dL
Ketones, ur: 15 mg/dL — AB
Leukocytes, UA: NEGATIVE
NITRITE: NEGATIVE
Protein, ur: 30 mg/dL — AB
SPECIFIC GRAVITY, URINE: 1.01 (ref 1.005–1.030)
pH: 6.5 (ref 5.0–8.0)

## 2018-10-12 LAB — CBC
HCT: 40.7 % (ref 36.0–46.0)
HEMOGLOBIN: 13.1 g/dL (ref 12.0–15.0)
MCH: 30.4 pg (ref 26.0–34.0)
MCHC: 32.2 g/dL (ref 30.0–36.0)
MCV: 94.4 fL (ref 80.0–100.0)
Platelets: 365 10*3/uL (ref 150–400)
RBC: 4.31 MIL/uL (ref 3.87–5.11)
RDW: 13.2 % (ref 11.5–15.5)
WBC: 8.1 10*3/uL (ref 4.0–10.5)
nRBC: 0 % (ref 0.0–0.2)

## 2018-10-12 LAB — COMPREHENSIVE METABOLIC PANEL
ALT: 34 U/L (ref 0–44)
AST: 23 U/L (ref 15–41)
Albumin: 3.2 g/dL — ABNORMAL LOW (ref 3.5–5.0)
Alkaline Phosphatase: 333 U/L — ABNORMAL HIGH (ref 38–126)
Anion gap: 11 (ref 5–15)
BILIRUBIN TOTAL: 1 mg/dL (ref 0.3–1.2)
BUN: 13 mg/dL (ref 6–20)
CO2: 25 mmol/L (ref 22–32)
Calcium: 8.8 mg/dL — ABNORMAL LOW (ref 8.9–10.3)
Chloride: 96 mmol/L — ABNORMAL LOW (ref 98–111)
Creatinine, Ser: 0.78 mg/dL (ref 0.44–1.00)
GFR calc non Af Amer: >60 mL/min (ref >60)
Glucose, Bld: 127 mg/dL — ABNORMAL HIGH (ref 70–99)
POTASSIUM: 3.1 mmol/L — AB (ref 3.5–5.1)
Sodium: 132 mmol/L — ABNORMAL LOW (ref 135–145)
TOTAL PROTEIN: 7 g/dL (ref 6.5–8.1)

## 2018-10-12 LAB — URINALYSIS, MICROSCOPIC (REFLEX)

## 2018-10-12 LAB — LIPASE, BLOOD: Lipase: 20 U/L (ref 11–51)

## 2018-10-12 MED ORDER — POTASSIUM CHLORIDE CRYS ER 20 MEQ PO TBCR: 20.0000 meq | EXTENDED_RELEASE_TABLET | Freq: Every day | ORAL | 0 refills | Status: DC

## 2018-10-12 MED ORDER — SODIUM CHLORIDE 0.9 % IV BOLUS (SEPSIS)
1000.0000 mL | Freq: Once | INTRAVENOUS | Status: AC
Start: 2018-10-12 — End: 2018-10-12
  Administered 2018-10-12: 1000 mL via INTRAVENOUS

## 2018-10-12 MED ORDER — SODIUM CHLORIDE 0.9 % IV SOLN
1000.0000 mL | INTRAVENOUS | Status: DC
Start: 2018-10-12 — End: 2018-10-13

## 2018-10-12 MED ORDER — POTASSIUM CHLORIDE CRYS ER 20 MEQ PO TBCR
40.0000 meq | EXTENDED_RELEASE_TABLET | Freq: Once | ORAL | Status: AC
  Administered 2018-10-12: 40 meq via ORAL
  Filled 2018-10-12: qty 2

## 2018-10-12 NOTE — ED Notes (Signed)
Pt denied feeling dizzy or lightheaded during orthostatic vital signs. NAD noted

## 2018-10-12 NOTE — ED Notes (Signed)
Placed in lying position for orthostatics

## 2018-10-12 NOTE — ED Notes (Signed)
Pt provided Stool sample-labeled at bedside

## 2018-10-12 NOTE — ED Triage Notes (Addendum)
Pt reports diarrhea and fever last Sunday. Seen at Urgent care on Tuesday and had neg flu test. States diarrhea has not stopped. No fever today. Reports 4 episodes today. States she had labs and CT yesterday. Seen at [facility] ED last night but left due to extended wait.

## 2018-10-12 NOTE — ED Notes (Signed)
Pt understood dc material. NAD noted. Script given at [facility]. NAD noted

## 2018-10-12 NOTE — ED Provider Notes (Signed)
[facility] EMERGENCY DEPARTMENT Provider Note   CSN: [id number] Arrival date & time: 10/12/18  1605     History   Chief Complaint Chief Complaint  Patient presents with  . Diarrhea    HPI Meghan Macdonald is a 57 y.o. female.  HPI Patient has had diarrhea for the past 5 or more days.  She reports symptoms started with body aches and fever.  She did have fever as high as 103 at 1 point.  She has however been afebrile now for a day and a half.  Reports that onset she was having up to 10+ episodes of diarrhea per day.  Very watery and large-volume but not bloody.  She did not develop any vomiting.  She reports she had been very nauseated at periods and had retching but never actually vomited.  She reports she has had gastric bypass and thus does not vomit easily.  Patient had been seen by her primary care doctor and referred to go to the emergency department.  [facility] and ultrasound but did not get seen by provider due to long wait times.  Doctor instructed her to come to the emergency department for recheck concern for colitis on CT scan.  Patient has no known sick contacts.  She works in an Technical brewer.  She does not have healthcare exposures.  No antibiotics in greater than 6 months. Past Medical History:  Diagnosis Date  . Anxiety   . Arthritis    knees  . Asthma    exertion or exteme cold.  . Back pain   . Carcinoid tumor of appendix   . Complication of anesthesia    SOB after second surgery 4 days after gastric bypass  . Depression   . Dysrhythmia    idiopathic arthy; had plapitations since a child. On metoprolol for 9 years;  . Essential hypertension, benign   . GERD (gastroesophageal reflux disease)   . Headache   . History of kidney stones   . History of migraine headaches   . IBS (irritable bowel syndrome)   . Insomnia   . Iron deficiency anemia   . Vitamin B deficiency     Patient Active Problem List   Diagnosis Date Noted  . Abnormal LFTs 12/11/2016  . Hx of adenomatous colonic polyps 12/11/2016  . Esophageal dysphagia 10/11/2016  . Barrett's esophagus 10/11/2016  . Colon cancer screening 10/11/2016  . Palpitations 10/06/2013  . Essential hypertension, benign 10/06/2013  . 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 Other and unspecified alcohol dependence, unspecified drinking behavior 10/06/2013  . Anemia, unspecified 10/06/2013  . Anxiety state, unspecified 10/06/2013  . Cardiac dysrhythmia, unspecified 10/06/2013  . Alopecia, unspecified 10/06/2013  . Cervicalgia 10/06/2013  . Other B-complex deficiencies 10/06/2013  . Contusion of unspecified site 10/06/2013  . Esophagitis, reflux 10/06/2013  . Iron deficiency anemia, unspecified 10/06/2013  . Migraine, unspecified, without mention of intractable migraine without mention of status migrainosus 10/06/2013  . Other specified cardiac dysrhythmias(427.89) 10/06/2013    Past Surgical History:  Procedure Laterality Date  . APPENDECTOMY  2001   carcinoid tumor, found during gastric bypass.  Marland Kitchen BREAST BIOPSY Right   . COLONOSCOPY WITH ESOPHAGOGASTRODUODENOSCOPY (EGD)  2008   Dr. Collene Mares: s/p right hemicolectomy, sigmoid colon diverticulosis, otherwise unremarkable. EGD unremarkable, s/p gastric bypass, No Barrett's  . COLONOSCOPY WITH PROPOFOL N/A 11/10/2016   Procedure: COLONOSCOPY WITH PROPOFOL;  Surgeon: Daneil Dolin, MD;  [facility];  Service: Endoscopy;  Laterality: N/A;  115  . CYSTOSCOPY    . ESOPHAGOGASTRODUODENOSCOPY (EGD) WITH PROPOFOL N/A 11/10/2016   Dr. 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 Esophagus dilated per history of dysphagia. Colonoscopy showed status post right hemicolectomy, scattered diverticula, 2 polyps removed from the rectum, one tubular adenoma. Next  colonoscopy 5 years, January 2023  . GASTRIC BYPASS  2001   incidental finding of carcinoid tumor of appendix at time of surgery with appendectomy  . MALONEY DILATION N/A 11/10/2016   Procedure: Venia Minks DILATION;  Surgeon: Daneil Dolin, MD;  [facility];  Service: Endoscopy;  Laterality: N/A;  . REPLACEMENT TOTAL KNEE Left 2006  . Rhinologic surgery    . RIGHT OOPHORECTOMY     removal of mesentary and part of colon and uterus with removal of appendix; 4 days after gastric bypass  . TONSILLECTOMY AND ADENOIDECTOMY       OB History   None      Home Medications    Prior to Admission medications   Medication Sig Start Date End Date Taking? Authorizing Provider  acetaminophen (TYLENOL) 500 MG tablet Take 500 mg by mouth every 6 (six) hours as needed (for pain/headache.).    [provider]  albuterol (PROVENTIL HFA;VENTOLIN HFA) 108 (90 BASE) MCG/ACT inhaler Inhale 1-2 puffs into the lungs every 6 (six) hours as needed for wheezing or shortness of breath.    [provider]  cholecalciferol (VITAMIN D) 1000 units tablet Take 1,000 Units by mouth daily.    [provider]  diltiazem (CARDIZEM CD) 180 MG 24 hr capsule Take 1 capsule (180 mg total) by mouth daily. 12/12/17 03/12/18  Herminio Commons, MD  fluticasone (FLONASE) 50 MCG/ACT nasal spray Place 1 spray into both nostrils daily as needed for allergies or rhinitis.     [provider]  metoprolol succinate (TOPROL-XL) 100 MG 24 hr tablet Take 1 tablet (100 mg total) by mouth daily. Take with or immediately following a meal. 02/01/18 05/02/18  Herminio Commons, MD  Multiple Vitamin (MULTIVITAMIN) tablet Take 1 tablet by mouth daily.    [provider]  naproxen sodium (ANAPROX) 220 MG tablet Take 220-440 mg by mouth 2 (two) times daily as needed (for pain/headache.).    [provider]  potassium chloride SA (K-DUR,KLOR-CON) 20 MEQ tablet Take 1 tablet (20 mEq total) by  mouth daily. 10/12/18   Charlesetta Shanks, MD  RABEprazole (ACIPHEX) 20 MG tablet Take 1 tablet (20 mg total) by mouth daily before breakfast. 12/04/17   Mahala Menghini, PA-C  vitamin B-12 (CYANOCOBALAMIN) 50 MCG tablet Take 50 mcg by mouth daily.    [provider]  zolpidem (AMBIEN) 10 MG tablet Take 10 mg by mouth at bedtime as needed for sleep. Sleep.     [provider]    Family History Family History  Problem Relation Age of Onset  . Asthma Mother   . Hyperlipidemia Mother   . Thyroid disease Mother   . Hyperlipidemia Father   . Lung cancer Father   . Colon cancer Maternal Grandmother 80    Social History Social History   Tobacco Use  . Smoking status: Never Smoker  . Smokeless tobacco: Never Used  Substance Use Topics  . Alcohol use: Yes    Alcohol/week: 2.0 standard drinks    Types: 2 Glasses  of wine per week    Comment: Regular alcohol intake, 5 days a week. Quit December 2017  . Drug use: No     Allergies   Azithromycin   Review of Systems Review of Systems 10 Systems reviewed and are negative for acute change except as noted in the HPI.   Physical Exam Updated Vital Signs BP (!) 98/58 (BP Location: Left Arm)   Pulse 78   Temp 98.8 F (37.1 C) (Oral)   Resp 20   Ht 5\' 4"  (1.626 m)   Wt 133.8 kg   SpO2 99%   BMI 50.64 kg/m   Physical Exam  Constitutional: She is oriented to person, place, and time. She appears well-developed and well-nourished.  HENT:  Head: Normocephalic and atraumatic.  Mouth/Throat: Oropharynx is clear and moist.  Eyes: Conjunctivae and EOM are normal.  Neck: Neck supple.  Cardiovascular: Normal rate, regular rhythm, normal heart sounds and intact distal pulses.  Pulmonary/Chest: Effort normal and breath sounds normal.  Abdominal: Soft. Bowel sounds are normal. She exhibits no distension. There is no tenderness. There is no guarding.  Musculoskeletal: Normal range of motion. She exhibits no edema or  tenderness.  Neurological: She is alert and oriented to person, place, and time. She has normal strength. Coordination normal. GCS eye subscore is 4. GCS verbal subscore is 5. GCS motor subscore is 6.  Skin: Skin is warm, dry and intact.  Psychiatric: She has a normal mood and affect.     ED Treatments / Results  Labs (all labs ordered are listed, but only abnormal results are displayed) Labs Reviewed  COMPREHENSIVE METABOLIC PANEL - Abnormal; Notable for the following components:      Result Value   Sodium 132 (*)    Potassium 3.1 (*)    Chloride 96 (*)    Glucose, Bld 127 (*)    Calcium 8.8 (*)    Albumin 3.2 (*)    Alkaline Phosphatase 333 (*)    All other components within normal limits  URINALYSIS, ROUTINE W REFLEX MICROSCOPIC - Abnormal; Notable for the following components:   APPearance CLOUDY (*)    Hgb urine dipstick TRACE (*)    Bilirubin Urine MODERATE (*)    Ketones, ur 15 (*)    Protein, ur 30 (*)    All other components within normal limits  URINALYSIS, MICROSCOPIC (REFLEX) - Abnormal; Notable for the following components:   Bacteria, UA MANY (*)    All other components within normal limits  GASTROINTESTINAL PANEL BY PCR, STOOL (REPLACES STOOL CULTURE)  C DIFFICILE QUICK SCREEN W PCR REFLEX  LIPASE, BLOOD  CBC    EKG None  Radiology No results found.  Procedures Procedures (including critical care time)  Medications Ordered in ED Medications  sodium chloride 0.9 % bolus 1,000 mL (0 mLs Intravenous Stopped 10/12/18 1943)    Followed by  0.9 %  sodium chloride infusion (has no administration in time range)  potassium chloride SA (K-DUR,KLOR-CON) CR tablet 40 mEq (40 mEq Oral Given 10/12/18 1908)     Initial Impression / Assessment and Plan / ED Course  I have reviewed the triage vital signs and the nursing notes.  Pertinent labs & imaging results that were available during my care of the patient were reviewed by me and considered in my medical  decision making (see chart for details).    Patient is alert and appropriate.  She does not have abdominal pain.  History is very suggestive of an infectious diarrheal illness.  Patient however is improving.  She has not had fever for approximately a day and a half.  She reports diarrhea is lessening although she continues to have some.  There is no blood.  Patient's urinalysis suggests mild dehydration.  Patient is rehydrated in the emergency department.  She is not orthostatic.  Stool specimen has been obtained.  Patient does not appear to have risk factors for C. difficile.  Symptoms are gradually improving, I do not think empiric treatment should be started.  Patient has mild hypokalemia.  This is in conjunction with diarrhea.  Will provide potassium to take for the next week.  Stool results can be reviewed by the patient's PCP.  Patient feels stable for discharge.  Return precautions reviewed.  Final Clinical Impressions(s) / ED Diagnoses   Final diagnoses:  Diarrhea of presumed infectious origin  Dehydration  Hypokalemia    ED Discharge Orders         Ordered    potassium chloride SA (K-DUR,KLOR-CON) 20 MEQ tablet  Daily     10/12/18 2105           Charlesetta Shanks, MD 10/12/18 2114

## 2018-10-13 ENCOUNTER — Telehealth (HOSPITAL_BASED_OUTPATIENT_CLINIC_OR_DEPARTMENT_OTHER): Payer: Self-pay | Admitting: *Deleted

## 2018-10-13 LAB — GASTROINTESTINAL PANEL BY PCR, STOOL (REPLACES STOOL CULTURE)
Adenovirus F40/41: NOT DETECTED
Astrovirus: NOT DETECTED
Campylobacter species: DETECTED — AB
Cryptosporidium: NOT DETECTED
Cyclospora cayetanensis: NOT DETECTED
ENTAMOEBA HISTOLYTICA: NOT DETECTED
ENTEROAGGREGATIVE E COLI (EAEC): NOT DETECTED
ENTEROPATHOGENIC E COLI (EPEC): NOT DETECTED
Enterotoxigenic E coli (ETEC): NOT DETECTED
GIARDIA LAMBLIA: NOT DETECTED
Norovirus GI/GII: NOT DETECTED
Plesimonas shigelloides: NOT DETECTED
Rotavirus A: NOT DETECTED
SALMONELLA SPECIES: NOT DETECTED
SHIGELLA/ENTEROINVASIVE E COLI (EIEC): NOT DETECTED
Sapovirus (I, II, IV, and V): NOT DETECTED
Shiga like toxin producing E coli (STEC): NOT DETECTED
VIBRIO CHOLERAE: NOT DETECTED
Vibrio species: NOT DETECTED
YERSINIA ENTEROCOLITICA: NOT DETECTED

## 2018-10-13 LAB — C DIFFICILE QUICK SCREEN W PCR REFLEX
C Diff antigen: NEGATIVE
C Diff interpretation: NOT DETECTED
C Diff toxin: NEGATIVE

## 2018-10-13 NOTE — Telephone Encounter (Signed)
Received call from lab, GI panel +camphylobacter. Chart reviewed by Dr. Sherry Ruffing and Windy Carina for Levaquin 750mg  Daily x 7 days, dispense 7, no refills. Patient contacted, results discussed. Advised to f/u with her PCP this week. Rx called in to [facility], [place] ([phone]) per pt request.

## 2018-11-18 ENCOUNTER — Ambulatory Visit
Admission: RE | Admit: 2018-11-18 | Discharge: 2018-11-18 | Disposition: A | Discharge status: Home / Self Care | Payer: Managed Care, Other (non HMO) | Source: Ambulatory Visit | Attending: Family Medicine | Admitting: Family Medicine

## 2018-11-18 DIAGNOSIS — Z1231 Encounter for screening mammogram for malignant neoplasm of breast: Secondary | ICD-10-CM

## 2018-12-05 ENCOUNTER — Ambulatory Visit: Payer: Managed Care, Other (non HMO) | Admitting: Cardiovascular Disease

## 2018-12-09 ENCOUNTER — Encounter: Payer: Self-pay | Admitting: Cardiovascular Disease

## 2018-12-09 ENCOUNTER — Ambulatory Visit (INDEPENDENT_AMBULATORY_CARE_PROVIDER_SITE_OTHER): Payer: Managed Care, Other (non HMO) | Admitting: Cardiovascular Disease

## 2018-12-09 VITALS — BP 132/74 | HR 62 | Ht 64.0 in | Wt 301.0 lb

## 2018-12-09 DIAGNOSIS — I10 Essential (primary) hypertension: Secondary | ICD-10-CM

## 2018-12-09 DIAGNOSIS — I471 Supraventricular tachycardia: Secondary | ICD-10-CM | POA: Diagnosis not present

## 2018-12-09 NOTE — Progress Notes (Signed)
SUBJECTIVE: The patient presents for follow-up of paroxysmal SVT.  She is morbidly obese.  ECG performed in the office today which I ordered and personally interpreted demonstrates sinus bradycardia, 56 bpm, with low voltage, and nonspecific IVCD.  The patient denies any symptoms of chest pain, palpitations, shortness of breath, lightheadedness, dizziness, leg swelling, orthopnea, PND, and syncope.  She takes Toprol-XL 100 mg daily.  She recently started a modified keto diet.  She would like to begin walking more.  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  She specializes in Occupational hygienist.  She now lives in [place].  She previously worked for [employer] for 15 years.  Her father was a Higher education careers adviser.  He died of lung cancer.  She has 1 daughter in her 41s.     Review of Systems: As per "subjective", otherwise negative.  Allergies  Allergen Reactions  . Azithromycin Other (See Comments)    Upset stomach    Current Outpatient Medications  Medication Sig Dispense Refill  . acetaminophen (TYLENOL) 500 MG tablet Take 500 mg by mouth every 6 (six) hours as needed (for pain/headache.).    Marland Kitchen albuterol (PROVENTIL HFA;VENTOLIN HFA) 108 (90 BASE) MCG/ACT inhaler Inhale 1-2 puffs into the lungs every 6 (six) hours as needed for wheezing or shortness of breath.    . cholecalciferol (VITAMIN D) 1000 units tablet Take 1,000 Units by mouth daily.    Marland Kitchen diltiazem (CARDIZEM CD) 180 MG 24 hr capsule Take 1 capsule (180 mg total) by mouth daily. 30 capsule 3  . Fexofenadine-Pseudoephedrine (ALLEGRA-D 24 HOUR PO) Take by mouth.    . fluticasone (FLONASE) 50 MCG/ACT nasal spray Place 1 spray into both nostrils daily as needed for allergies or rhinitis.     . metoprolol succinate (TOPROL-XL) 100 MG 24 hr tablet Take 1 tablet (100 mg total) by mouth daily. Take with or immediately following a meal. 90 tablet 3  .  Multiple Vitamin (MULTIVITAMIN) tablet Take 1 tablet by mouth daily.    . naproxen sodium (ANAPROX) 220 MG tablet Take 220-440 mg by mouth 2 (two) times daily as needed (for pain/headache.).    Marland Kitchen potassium chloride SA (K-DUR,KLOR-CON) 20 MEQ tablet Take 1 tablet (20 mEq total) by mouth daily. 7 tablet 0  . RABEprazole (ACIPHEX) 20 MG tablet Take 1 tablet (20 mg total) by mouth daily before breakfast. 30 tablet 11  . vitamin B-12 (CYANOCOBALAMIN) 50 MCG tablet Take 50 mcg by mouth daily.    Marland Kitchen zolpidem (AMBIEN) 10 MG tablet Take 10 mg by mouth at bedtime as needed for sleep. Sleep.      No current facility-administered medications for this visit.     Past Medical History:  Diagnosis Date  . Anxiety   . Arthritis    knees  . Asthma    exertion or exteme cold.  . Back pain   . Carcinoid tumor of appendix   . Complication of anesthesia    SOB after second surgery 4 days after gastric bypass  . Depression   . Dysrhythmia    idiopathic arthy; had plapitations since a child. On metoprolol for 9 years;  . Essential hypertension, benign   . GERD (gastroesophageal reflux disease)   . Headache   . History of kidney stones   . History of migraine headaches   . IBS (irritable bowel syndrome)   . Insomnia   . Iron  deficiency anemia   . Vitamin B deficiency     Past Surgical History:  Procedure Laterality Date  . APPENDECTOMY  2001   carcinoid tumor, found during gastric bypass.  Marland Kitchen BREAST BIOPSY Right   . COLONOSCOPY WITH ESOPHAGOGASTRODUODENOSCOPY (EGD)  2008   Dr. Collene Mares: s/p right hemicolectomy, sigmoid colon diverticulosis, otherwise unremarkable. EGD unremarkable, s/p gastric bypass, No Barrett's  . COLONOSCOPY WITH PROPOFOL N/A 11/10/2016   Procedure: COLONOSCOPY WITH PROPOFOL;  Surgeon: Daneil Dolin, MD;  Location: [facility];  Service: Endoscopy;  Laterality: N/A;  115  . CYSTOSCOPY    . ESOPHAGOGASTRODUODENOSCOPY (EGD) WITH PROPOFOL N/A 11/10/2016   Dr. Gala Romney: Because of changes  found in the esophagus, as any weighted undulating Z line but no Barrett's, small bowel biopsy negative for celiac, Billroth II configuration found. Esophagus dilated per history of dysphagia. Colonoscopy showed status post right hemicolectomy, scattered diverticula, 2 polyps removed from the rectum, one tubular adenoma. Next colonoscopy 5 years, January 2023  . GASTRIC BYPASS  2001   incidental finding of carcinoid tumor of appendix at time of surgery with appendectomy  . MALONEY DILATION N/A 11/10/2016   Procedure: Venia Minks DILATION;  Surgeon: Daneil Dolin, MD;  Location: [facility];  Service: Endoscopy;  Laterality: N/A;  . REPLACEMENT TOTAL KNEE Left 2006  . Rhinologic surgery    . RIGHT OOPHORECTOMY     removal of mesentary and part of colon and uterus with removal of appendix; 4 days after gastric bypass  . TONSILLECTOMY AND ADENOIDECTOMY      Social History   Socioeconomic History  . Marital status: Divorced    Spouse name: Not on file  . Number of children: Not on file  . Years of education: 12+  . Highest education level: Not on file  Occupational History    Employer: [employer]  Social Needs  . Financial resource strain: Not on file  . Food insecurity:    Worry: Not on file    Inability: Not on file  . Transportation needs:    Medical: Not on file    Non-medical: Not on file  Tobacco Use  . Smoking status: Never Smoker  . Smokeless tobacco: Never Used  Substance and Sexual Activity  . Alcohol use: Yes    Alcohol/week: 2.0 standard drinks    Types: 2 Glasses of wine per week    Comment: Regular alcohol intake, 5 days a week. Quit December 2017  . Drug use: No  . Sexual activity: Never    Birth control/protection: None  Lifestyle  . Physical activity:    Days per week: Not on file    Minutes per session: Not on file  . Stress: Not on file  Relationships  . Social connections:    Talks on phone: Not on file    Gets together: Not on file    Attends  religious service: Not on file    Active member of club or organization: Not on file    Attends meetings of clubs or organizations: Not on file    Relationship status: Not on file  . Intimate partner violence:    Fear of current or ex partner: Not on file    Emotionally abused: Not on file    Physically abused: Not on file    Forced sexual activity: Not on file  Other Topics Concern  . Not on file  Social History Narrative  . Not on file     Vitals:  12/09/18 1542  BP: 132/74  Pulse: 62  SpO2: 95%  Weight: (!) 301 lb (136.5 kg)  Height: 5\' 4"  (1.626 m)    Wt Readings from Last 3 Encounters:  12/09/18 (!) 301 lb (136.5 kg)  10/12/18 295 lb (133.8 kg)  11/30/17 (!) 304 lb (137.9 kg)     PHYSICAL EXAM General: NAD HEENT: Normal. Neck: No JVD, no thyromegaly. Lungs: Clear to auscultation bilaterally with normal respiratory effort. CV: Regular rate and rhythm, normal S1/S2, no S3/S4, no murmur. No pretibial or periankle edema.  No carotid bruit.   Abdomen: Soft, nontender, obese.  Neurologic: Alert and oriented.  Psych: Normal affect. Skin: Normal. Musculoskeletal: No gross deformities.    ECG: Reviewed above under Subjective   Labs: Lab Results  Component Value Date/Time   K 3.1 (L) 10/12/2018 05:06 PM   BUN 13 10/12/2018 05:06 PM   CREATININE 0.78 10/12/2018 05:06 PM   ALT 34 10/12/2018 05:06 PM   HGB 13.1 10/12/2018 05:06 PM   HGB 13.8 09/22/2009 04:25 PM     Lipids: No results found for: LDLCALC, LDLDIRECT, CHOL, TRIG, HDL     ASSESSMENT AND PLAN: 1.  Paroxysmal SVT: Symptomatically stable on Toprol-XL 100 mg daily.  No changes to therapy.  2.  Hypertension: Blood pressure is normal.  No changes to therapy.    Disposition: Follow up 1 year   Kate Sable, M.D., F.A.C.C.

## 2018-12-09 NOTE — Patient Instructions (Signed)
Medication Instructions:  Your physician recommends that you continue on your current medications as directed. Please refer to the Current Medication list given to you today.  If you need a refill on your cardiac medications before your next appointment, please call your pharmacy.   Lab work: None today If you have labs (blood work) drawn today and your tests are completely normal, you will receive your results only by: Marland Kitchen MyChart Message (if you have MyChart) OR . A paper copy in the mail If you have any lab test that is abnormal or we need to change your treatment, we will call you to review the results.  Testing/Procedures: None today  Follow-Up: At [facility], you and your health needs are our priority.  As part of our continuing mission to provide you with exceptional heart care, we have created designated Provider Care Teams.  These Care Teams include your primary Cardiologist (physician) and Advanced Practice Providers (APPs -  Physician Assistants and Nurse Practitioners) who all work together to provide you with the care you need, when you need it. You will need a follow up appointment in 12 months.  Please call our office 2 months in advance to schedule this appointment.  You may see Kate Sable, MD or one of the following Advanced Practice Providers on your designated Care Team:   Bernerd Pho, PA-C [facility]) . Ermalinda Barrios, PA-C ([place])  Any Other Special Instructions Will Be Listed Below (If Applicable). None

## 2018-12-21 ENCOUNTER — Other Ambulatory Visit: Payer: Self-pay | Admitting: Gastroenterology

## 2019-06-21 ENCOUNTER — Other Ambulatory Visit: Payer: Self-pay | Admitting: Nurse Practitioner

## 2019-06-23 ENCOUNTER — Other Ambulatory Visit: Payer: Self-pay | Admitting: Cardiovascular Disease

## 2019-06-23 ENCOUNTER — Telehealth: Payer: Self-pay | Admitting: Cardiovascular Disease

## 2019-06-23 ENCOUNTER — Telehealth: Payer: Self-pay | Admitting: *Deleted

## 2019-06-23 NOTE — Telephone Encounter (Signed)
Pt left voice mail stating she's needing refill on metoprolol succinate (TOPROL-XL) 100 MG 24 hr tablet [[id number]] ENDED  Sent to the [facility] in [place]

## 2019-06-23 NOTE — Telephone Encounter (Signed)
Pt wants to know if we can send RX to [facility] on [address] for Rabeprazole 20 mg.  Pt says she has been having trouble with the pharmacy sending it to [place].  [phone]

## 2019-06-23 NOTE — Telephone Encounter (Signed)
Routing to the [facility] refill box

## 2019-06-23 NOTE — Telephone Encounter (Signed)
Patient needs ov. Has been over two years. Will provide refill X1.

## 2019-06-24 ENCOUNTER — Encounter: Payer: Self-pay | Admitting: Internal Medicine

## 2019-06-24 MED ORDER — METOPROLOL SUCCINATE ER 100 MG PO TB24: 100.0000 mg | ORAL_TABLET | Freq: Every day | ORAL | 3 refills | Status: DC

## 2019-06-24 NOTE — Telephone Encounter (Signed)
SCHEDULED AND LETTER SENT  °

## 2019-06-24 NOTE — Telephone Encounter (Signed)
I sent RX yesterday before this phone call was received. Gave one refill.  See refill note. She needs OV. Has been more than 2 years since she has been seen.

## 2019-06-24 NOTE — Telephone Encounter (Signed)
Refill sent.

## 2019-06-24 NOTE — Telephone Encounter (Signed)
Called pt, could not leave a message.  Mailed letter for her to call and schedule an appointment.

## 2019-07-22 ENCOUNTER — Ambulatory Visit: Payer: Managed Care, Other (non HMO) | Admitting: Gastroenterology

## 2019-07-25 ENCOUNTER — Other Ambulatory Visit: Payer: Self-pay | Admitting: Gastroenterology

## 2019-08-08 ENCOUNTER — Ambulatory Visit (INDEPENDENT_AMBULATORY_CARE_PROVIDER_SITE_OTHER): Payer: Managed Care, Other (non HMO) | Admitting: Gastroenterology

## 2019-08-08 ENCOUNTER — Other Ambulatory Visit: Payer: Self-pay

## 2019-08-08 ENCOUNTER — Encounter: Payer: Self-pay | Admitting: Gastroenterology

## 2019-08-08 DIAGNOSIS — K219 Gastro-esophageal reflux disease without esophagitis: Secondary | ICD-10-CM | POA: Diagnosis not present

## 2019-08-08 MED ORDER — RABEPRAZOLE SODIUM 20 MG PO TBEC: DELAYED_RELEASE_TABLET | ORAL | 3 refills | Status: DC

## 2019-08-08 NOTE — Progress Notes (Signed)
Primary Care Physician: Robyne Peers, MD  Primary Gastroenterologist:  Garfield Cornea, MD   Chief Complaint  Patient presents with  . Gastroesophageal Reflux    medication refill Aciphex     HPI: Meghan Macdonald is a 58 y.o. female here for f/u GERD. Last seen in 2018.   EGD/TCS in January 2018 negative for Barrett's esophagus, small bowel biopsy negative for celiac, Billroth II configuration found.  Esophagus dilated for history of dysphagia.  Colonoscopy showed right hemicolectomy, scattered diverticula, 2 polyps removed, one tubular adenoma.  Next colonoscopy January 2023.  Doing well on AcipHex.  Rare breakthrough symptoms.  No dysphagia.  No abdominal pain.  Bowel movements are regular.  No melena or rectal bleeding.  Has gained some weight during COVID.  Working from home.  Has been less active.  [facility]   Current Outpatient Medications  Medication Sig Dispense Refill  . acetaminophen (TYLENOL) 500 MG tablet Take 500 mg by mouth every 6 (six) hours as needed (for pain/headache.).    Marland Kitchen albuterol (PROVENTIL HFA;VENTOLIN HFA) 108 (90 BASE) MCG/ACT inhaler Inhale 1-2 puffs into the lungs every 6 (six) hours as needed for wheezing or shortness of breath.    . cholecalciferol (VITAMIN D) 1000 units tablet Take 1,000 Units by mouth daily.    Marland Kitchen Fexofenadine-Pseudoephedrine (ALLEGRA-D 24 HOUR PO) Take by mouth.    . fluticasone (FLONASE) 50 MCG/ACT nasal spray Place 1 spray into both nostrils daily as needed for allergies or rhinitis.     . metoprolol succinate (TOPROL-XL) 100 MG 24 hr tablet Take 1 tablet (100 mg total) by mouth daily. Take with or immediately following a meal. 90 tablet 3  . Multiple Vitamin (MULTIVITAMIN) tablet Take 1 tablet by mouth daily.    . Multiple Vitamins-Minerals (PRESERVISION AREDS 2+MULTI VIT PO) Take by mouth 2 (two) times daily.    . naproxen sodium (ANAPROX) 220 MG tablet Take 220-440 mg by mouth 2 (two) times  daily as needed (for pain/headache.).    Marland Kitchen RABEprazole (ACIPHEX) 20 MG tablet TAKE 1 TABLET BY MOUTH ONCE DAILY BEFORE BREAKFAST 30 tablet 2  . vitamin B-12 (CYANOCOBALAMIN) 50 MCG tablet Take 50 mcg by mouth daily.    Marland Kitchen zolpidem (AMBIEN) 5 MG tablet Take 5 mg by mouth at bedtime as needed for sleep.     No current facility-administered medications for this visit.     Allergies as of 08/08/2019 - Review Complete 08/08/2019  Allergen Reaction Noted  . Azithromycin Other (See Comments) 03/10/2015    ROS:  General: Negative for anorexia, weight loss, fever, chills, fatigue, weakness. ENT: Negative for hoarseness, difficulty swallowing , nasal congestion. CV: Negative for chest pain, angina, palpitations, dyspnea on exertion, peripheral edema.  Respiratory: Negative for dyspnea at rest, dyspnea on exertion, cough, sputum, wheezing.  GI: See history of present illness. GU:  Negative for dysuria, hematuria, urinary incontinence, urinary frequency, nocturnal urination.  Endo: Negative for unusual weight change.    Physical Examination:   BP 130/71   Pulse (!) 58   Temp (!) 97.2 F (36.2 C) (Oral)   Ht 5\' 4"  (1.626 m)   Wt (!) 311 lb 12.8 oz (141.4 kg)   BMI 53.52 kg/m   General: Well-nourished, well-developed in no acute distress.  Eyes: No icterus. Mouth: Oropharyngeal mucosa moist and pink , no lesions erythema or exudate. Lungs: Clear to auscultation bilaterally.  Heart: Regular rate and rhythm, no murmurs rubs or  gallops.  Abdomen: Bowel sounds are normal, nontender, nondistended, no hepatosplenomegaly or masses, no abdominal bruits or hernia , no rebound or guarding.   Extremities: No lower extremity edema. No clubbing or deformities. Neuro: Alert and oriented x 4   Skin: Warm and dry, no jaundice.   Psych: Alert and cooperative, normal mood and affect.

## 2019-08-08 NOTE — Patient Instructions (Signed)
1. Continue Aciphex 20mg  daily before breakfast. New RX sent to your pharmacy. 2. I will check current guidelines regarding surveillance of carcinoid tumors and let you know if you need a sooner colonoscopy.  3. Return to the office in two years or call sooner if needed.

## 2019-08-08 NOTE — Assessment & Plan Note (Addendum)
Doing well on AcipHex 20 mg daily.  Reinforced antireflux measures.  Plan to see her back in 2 years, call sooner if needed.  Patient inquires about timing of next TCS. Had plans for 11/2021. She wonders if she needs sooner due to her history of appendiceal carcinoid tumor dx 2001, s/p right hemicolectomy. She used to follow with [facility] in 2011 locally but has not been seen in years. Will ask if she needs follow up with them but given remote diagnosis I doubt further surveillance needed.

## 2019-08-11 NOTE — Telephone Encounter (Signed)
ERROR

## 2019-08-13 ENCOUNTER — Telehealth: Payer: Self-pay | Admitting: Gastroenterology

## 2019-08-13 NOTE — Telephone Encounter (Signed)
Lmom, waiting on a return call.  

## 2019-08-13 NOTE — Telephone Encounter (Signed)
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 Delton Coombes.  No need for follow up this far out of diagnosis. Her colonoscopy frequency is based on prior h/o colon polyps.   Next colonoscopy 11/2021.

## 2019-08-19 NOTE — Telephone Encounter (Signed)
Left a detailed message for pt. Pt notified of message below.   Please NIC for TCS 11/2021

## 2019-08-21 NOTE — Telephone Encounter (Signed)
Reminder in epic °

## 2019-09-05 DIAGNOSIS — G5762 Lesion of plantar nerve, left lower limb: Secondary | ICD-10-CM | POA: Insufficient documentation

## 2020-06-14 ENCOUNTER — Other Ambulatory Visit: Payer: Self-pay

## 2020-06-14 MED ORDER — METOPROLOL SUCCINATE ER 100 MG PO TB24: 100.0000 mg | ORAL_TABLET | Freq: Every day | ORAL | 0 refills | Status: DC

## 2020-06-14 NOTE — Telephone Encounter (Signed)
Pt overdue from 12/2019 for follow up.Thirty day supply given. [place] office staff to reach out to schedule apt, recall was sent in Jan 2021

## 2020-06-16 ENCOUNTER — Other Ambulatory Visit: Payer: Self-pay | Admitting: Student

## 2020-06-16 MED ORDER — METOPROLOL SUCCINATE ER 100 MG PO TB24: 100.0000 mg | ORAL_TABLET | Freq: Every day | ORAL | 0 refills | Status: DC

## 2020-08-10 ENCOUNTER — Other Ambulatory Visit: Payer: Self-pay

## 2020-08-10 ENCOUNTER — Encounter (HOSPITAL_BASED_OUTPATIENT_CLINIC_OR_DEPARTMENT_OTHER): Payer: Self-pay | Admitting: *Deleted

## 2020-08-10 ENCOUNTER — Inpatient Hospital Stay (HOSPITAL_BASED_OUTPATIENT_CLINIC_OR_DEPARTMENT_OTHER)
Admission: EM | Admit: 2020-08-10 | Discharge: 2020-08-16 | DRG: 445 | Disposition: A | Discharge status: Other Acute Inpatient Hospital | Payer: Managed Care, Other (non HMO) | Attending: Internal Medicine | Admitting: Internal Medicine

## 2020-08-10 ENCOUNTER — Emergency Department (HOSPITAL_BASED_OUTPATIENT_CLINIC_OR_DEPARTMENT_OTHER): Payer: Managed Care, Other (non HMO)

## 2020-08-10 DIAGNOSIS — K8309 Other cholangitis: Secondary | ICD-10-CM | POA: Diagnosis not present

## 2020-08-10 DIAGNOSIS — K8050 Calculus of bile duct without cholangitis or cholecystitis without obstruction: Secondary | ICD-10-CM

## 2020-08-10 DIAGNOSIS — Z825 Family history of asthma and other chronic lower respiratory diseases: Secondary | ICD-10-CM | POA: Diagnosis not present

## 2020-08-10 DIAGNOSIS — K449 Diaphragmatic hernia without obstruction or gangrene: Secondary | ICD-10-CM | POA: Diagnosis present

## 2020-08-10 DIAGNOSIS — Z888 Allergy status to other drugs, medicaments and biological substances status: Secondary | ICD-10-CM

## 2020-08-10 DIAGNOSIS — Z79899 Other long term (current) drug therapy: Secondary | ICD-10-CM

## 2020-08-10 DIAGNOSIS — J45909 Unspecified asthma, uncomplicated: Secondary | ICD-10-CM | POA: Diagnosis present

## 2020-08-10 DIAGNOSIS — Z87442 Personal history of urinary calculi: Secondary | ICD-10-CM

## 2020-08-10 DIAGNOSIS — K219 Gastro-esophageal reflux disease without esophagitis: Secondary | ICD-10-CM | POA: Diagnosis present

## 2020-08-10 DIAGNOSIS — I119 Hypertensive heart disease without heart failure: Secondary | ICD-10-CM | POA: Diagnosis present

## 2020-08-10 DIAGNOSIS — F419 Anxiety disorder, unspecified: Secondary | ICD-10-CM | POA: Diagnosis present

## 2020-08-10 DIAGNOSIS — Z9049 Acquired absence of other specified parts of digestive tract: Secondary | ICD-10-CM | POA: Diagnosis not present

## 2020-08-10 DIAGNOSIS — Z8349 Family history of other endocrine, nutritional and metabolic diseases: Secondary | ICD-10-CM | POA: Diagnosis not present

## 2020-08-10 DIAGNOSIS — I471 Supraventricular tachycardia: Secondary | ICD-10-CM | POA: Diagnosis present

## 2020-08-10 DIAGNOSIS — Z6841 Body Mass Index (BMI) 40.0 and over, adult: Secondary | ICD-10-CM | POA: Diagnosis not present

## 2020-08-10 DIAGNOSIS — Z83438 Family history of other disorder of lipoprotein metabolism and other lipidemia: Secondary | ICD-10-CM

## 2020-08-10 DIAGNOSIS — K8063 Calculus of gallbladder and bile duct with acute cholecystitis with obstruction: Secondary | ICD-10-CM | POA: Diagnosis present

## 2020-08-10 DIAGNOSIS — K810 Acute cholecystitis: Secondary | ICD-10-CM | POA: Diagnosis present

## 2020-08-10 DIAGNOSIS — I10 Essential (primary) hypertension: Secondary | ICD-10-CM | POA: Diagnosis present

## 2020-08-10 DIAGNOSIS — Z90711 Acquired absence of uterus with remaining cervical stump: Secondary | ICD-10-CM | POA: Diagnosis not present

## 2020-08-10 DIAGNOSIS — F32A Depression, unspecified: Secondary | ICD-10-CM | POA: Diagnosis present

## 2020-08-10 DIAGNOSIS — Z96652 Presence of left artificial knee joint: Secondary | ICD-10-CM | POA: Diagnosis present

## 2020-08-10 DIAGNOSIS — Z9884 Bariatric surgery status: Secondary | ICD-10-CM

## 2020-08-10 DIAGNOSIS — K831 Obstruction of bile duct: Secondary | ICD-10-CM

## 2020-08-10 DIAGNOSIS — K8020 Calculus of gallbladder without cholecystitis without obstruction: Secondary | ICD-10-CM

## 2020-08-10 DIAGNOSIS — R0902 Hypoxemia: Secondary | ICD-10-CM

## 2020-08-10 DIAGNOSIS — Z20822 Contact with and (suspected) exposure to covid-19: Secondary | ICD-10-CM | POA: Diagnosis present

## 2020-08-10 DIAGNOSIS — K8030 Calculus of bile duct with cholangitis, unspecified, without obstruction: Secondary | ICD-10-CM | POA: Diagnosis not present

## 2020-08-10 LAB — RESPIRATORY PANEL BY RT PCR (FLU A&B, COVID)
Influenza A by PCR: NEGATIVE
Influenza B by PCR: NEGATIVE
SARS Coronavirus 2 by RT PCR: NEGATIVE

## 2020-08-10 LAB — URINALYSIS, ROUTINE W REFLEX MICROSCOPIC
Glucose, UA: 100 mg/dL — AB
Hgb urine dipstick: NEGATIVE
Ketones, ur: NEGATIVE mg/dL
Leukocytes,Ua: NEGATIVE
Nitrite: NEGATIVE
Protein, ur: NEGATIVE mg/dL
Specific Gravity, Urine: 1.015 (ref 1.005–1.030)
pH: 7 (ref 5.0–8.0)

## 2020-08-10 LAB — COMPREHENSIVE METABOLIC PANEL
ALT: 270 U/L — ABNORMAL HIGH (ref 0–44)
AST: 177 U/L — ABNORMAL HIGH (ref 15–41)
Albumin: 3.3 g/dL — ABNORMAL LOW (ref 3.5–5.0)
Alkaline Phosphatase: 506 U/L — ABNORMAL HIGH (ref 38–126)
Anion gap: 14 (ref 5–15)
BUN: 10 mg/dL (ref 6–20)
CO2: 25 mmol/L (ref 22–32)
Calcium: 9 mg/dL (ref 8.9–10.3)
Chloride: 98 mmol/L (ref 98–111)
Creatinine, Ser: 0.42 mg/dL — ABNORMAL LOW (ref 0.44–1.00)
GFR calc non Af Amer: >60 mL/min (ref >60)
Glucose, Bld: 142 mg/dL — ABNORMAL HIGH (ref 70–99)
Potassium: 3.8 mmol/L (ref 3.5–5.1)
Sodium: 137 mmol/L (ref 135–145)
Total Bilirubin: 7.4 mg/dL — ABNORMAL HIGH (ref 0.3–1.2)
Total Protein: 7.4 g/dL (ref 6.5–8.1)

## 2020-08-10 LAB — CBC
HCT: 42.4 % (ref 36.0–46.0)
Hemoglobin: 13.4 g/dL (ref 12.0–15.0)
MCH: 30.9 pg (ref 26.0–34.0)
MCHC: 31.6 g/dL (ref 30.0–36.0)
MCV: 97.7 fL (ref 80.0–100.0)
Platelets: 467 10*3/uL — ABNORMAL HIGH (ref 150–400)
RBC: 4.34 MIL/uL (ref 3.87–5.11)
RDW: 14.2 % (ref 11.5–15.5)
WBC: 15.7 10*3/uL — ABNORMAL HIGH (ref 4.0–10.5)
nRBC: 0 % (ref 0.0–0.2)

## 2020-08-10 LAB — PREGNANCY, URINE: Preg Test, Ur: NEGATIVE

## 2020-08-10 LAB — LIPASE, BLOOD: Lipase: 23 U/L (ref 11–51)

## 2020-08-10 MED ORDER — HYDROMORPHONE HCL 1 MG/ML IJ SOLN
1.0000 mg | Freq: Once | INTRAMUSCULAR | Status: AC
  Administered 2020-08-10: 1 mg via INTRAVENOUS
  Filled 2020-08-10: qty 1

## 2020-08-10 MED ORDER — PIPERACILLIN-TAZOBACTAM 3.375 G IVPB 30 MIN
3.3750 g | Freq: Once | INTRAVENOUS | Status: AC
  Administered 2020-08-10: 3.375 g via INTRAVENOUS
  Filled 2020-08-10: qty 50

## 2020-08-10 MED ORDER — ONDANSETRON HCL 4 MG/2ML IJ SOLN
4.0000 mg | Freq: Once | INTRAMUSCULAR | Status: AC
  Administered 2020-08-10: 4 mg via INTRAVENOUS
  Filled 2020-08-10: qty 2

## 2020-08-10 MED ORDER — ACETAMINOPHEN 325 MG PO TABS
650.0000 mg | ORAL_TABLET | Freq: Once | ORAL | Status: AC
  Administered 2020-08-10: 650 mg via ORAL
  Filled 2020-08-10: qty 2

## 2020-08-10 MED ORDER — SODIUM CHLORIDE 0.9 % IV SOLN
INTRAVENOUS | Status: DC | PRN
  Administered 2020-08-10: 250 mL via INTRAVENOUS

## 2020-08-10 MED ORDER — IOHEXOL 300 MG/ML  SOLN
100.0000 mL | Freq: Once | INTRAMUSCULAR | Status: AC | PRN
  Administered 2020-08-10: 100 mL via INTRAVENOUS

## 2020-08-10 NOTE — ED Notes (Signed)
Pt SpO2 on room air sats down to 85% after dilaudid administration. Pt placed back on 2L [place]. Spo2 on 95%.

## 2020-08-10 NOTE — ED Provider Notes (Addendum)
[facility] EMERGENCY DEPARTMENT Provider Note   CSN: [id number] Arrival date & time: 08/10/20  1538     History Chief Complaint  Patient presents with  . Abdominal Pain    Meghan Macdonald is a 59 y.o. female.  Patient with about 2 weeks history of fevers at night.  About a week and a half of right upper quadrant abdominal pain.  Seen about a week ago for concerns for dark urine treated as if it could be a urinary tract infection.  Patient felt things improved but then started again with dark urine.  She also noted her skin is yellow.  Patient status post gastric bypass in the past.  Patient is also had both Covid vaccines.  Patient admits to nausea no vomiting but difficult to vomit due to the bypass surgery.  Ultrasound not available here this evening.        Past Medical History:  Diagnosis Date  . Anxiety   . Arthritis    knees  . Asthma    exertion or exteme cold.  . Back pain   . Carcinoid tumor of appendix   . Complication of anesthesia    SOB after second surgery 4 days after gastric bypass  . Depression   . Dysrhythmia    idiopathic arthy; had plapitations since a child. On metoprolol for 9 years;  . Essential hypertension, benign   . GERD (gastroesophageal reflux disease)   . Headache   . History of kidney stones   . History of migraine headaches   . IBS (irritable bowel syndrome)   . Insomnia   . Iron deficiency anemia   . Vitamin B deficiency     Patient Active Problem List   Diagnosis Date Noted  . GERD (gastroesophageal reflux disease)   . Abnormal LFTs 12/11/2016  . Hx of adenomatous colonic polyps 12/11/2016  . Esophageal dysphagia 10/11/2016  . Barrett's esophagus 10/11/2016  . Colon cancer screening 10/11/2016  . Palpitations 10/06/2013  . Essential hypertension, benign 10/06/2013  . 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 Other and unspecified alcohol dependence, unspecified drinking behavior 10/06/2013  .  Anemia, unspecified 10/06/2013  . Anxiety state, unspecified 10/06/2013  . Cardiac dysrhythmia, unspecified 10/06/2013  . Alopecia, unspecified 10/06/2013  . Cervicalgia 10/06/2013  . Other B-complex deficiencies 10/06/2013  . Contusion of unspecified site 10/06/2013  . Esophagitis, reflux 10/06/2013  . Iron deficiency anemia, unspecified 10/06/2013  . Migraine, unspecified, without mention of intractable migraine without mention of status migrainosus 10/06/2013  . Other specified cardiac dysrhythmias(427.89) 10/06/2013    Past Surgical History:  Procedure Laterality Date  . APPENDECTOMY  2001   carcinoid tumor, found during gastric bypass.  Marland Kitchen BREAST BIOPSY Right   . COLONOSCOPY WITH ESOPHAGOGASTRODUODENOSCOPY (EGD)  2008   Dr. Collene Mares: s/p right hemicolectomy, sigmoid colon diverticulosis, otherwise unremarkable. EGD unremarkable, s/p gastric bypass, No Barrett's  . COLONOSCOPY WITH PROPOFOL N/A 11/10/2016   Procedure: COLONOSCOPY WITH PROPOFOL;  Surgeon: Daneil Dolin, MD;  [facility];  Service: Endoscopy;  Laterality: N/A;  115  . CYSTOSCOPY    . ESOPHAGOGASTRODUODENOSCOPY (EGD) WITH PROPOFOL N/A 11/10/2016   Dr. 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 Esophagus dilated per history of dysphagia. Colonoscopy showed status post right hemicolectomy, scattered diverticula, 2 polyps removed from the rectum, one tubular adenoma. Next colonoscopy 5 years, January 2023  .  GASTRIC BYPASS  2001   incidental finding of carcinoid tumor of appendix at time of surgery with appendectomy  . MALONEY DILATION N/A 11/10/2016   Procedure: Venia Minks DILATION;  Surgeon: Daneil Dolin, MD;  [facility];  Service: Endoscopy;  Laterality: N/A;  . REPLACEMENT TOTAL KNEE Left 2006  . Rhinologic surgery    . RIGHT OOPHORECTOMY     removal of mesentary and part  of colon and uterus with removal of appendix; 4 days after gastric bypass  . TONSILLECTOMY AND ADENOIDECTOMY       OB History   No obstetric history on file.     Family History  Problem Relation Age of Onset  . Asthma Mother   . Hyperlipidemia Mother   . Thyroid disease Mother   . Hyperlipidemia Father   . Lung cancer Father   . Colon cancer Maternal Grandmother 61  . Breast cancer Neg Hx     Social History   Tobacco Use  . Smoking status: Never Smoker  . Smokeless tobacco: Never Used  Vaping Use  . Vaping Use: Never used  Substance Use Topics  . Alcohol use: Yes    Alcohol/week: 2.0 standard drinks    Types: 2 Glasses of wine per week    Comment: Regular alcohol intake, 5 days a week. Quit December 2017  . Drug use: No    Home Medications Prior to Admission medications   Medication Sig Start Date End Date Taking? Authorizing Provider  DULoxetine (CYMBALTA) 60 MG capsule Take by mouth. 05/14/17  Yes [provider]  acetaminophen (TYLENOL) 500 MG tablet Take 500 mg by mouth every 6 (six) hours as needed (for pain/headache.).    [provider]  albuterol (PROVENTIL HFA;VENTOLIN HFA) 108 (90 BASE) MCG/ACT inhaler Inhale 1-2 puffs into the lungs every 6 (six) hours as needed for wheezing or shortness of breath.    [provider]  cholecalciferol (VITAMIN D) 1000 units tablet Take 1,000 Units by mouth daily.    [provider]  Fexofenadine-Pseudoephedrine (ALLEGRA-D 24 HOUR PO) Take by mouth.    [provider]  fluticasone (FLONASE) 50 MCG/ACT nasal spray Place 1 spray into both nostrils daily as needed for allergies or rhinitis.     [provider]  metoprolol succinate (TOPROL-XL) 100 MG 24 hr tablet Take 1 tablet (100 mg total) by mouth daily. Take with or immediately following a meal. 06/16/20 09/14/20  Strader, Fransisco Hertz, PA-C  Multiple Vitamin (MULTIVITAMIN) tablet Take 1 tablet by mouth daily.    [provider]  Multiple Vitamins-Minerals (PRESERVISION AREDS 2+MULTI VIT PO) Take by mouth 2 (two) times daily.    [provider]  naproxen sodium (ANAPROX) 220 MG tablet Take 220-440 mg by mouth 2 (two) times daily as needed (for pain/headache.).    [provider]  RABEprazole (ACIPHEX) 20 MG tablet TAKE 1 TABLET BY MOUTH ONCE DAILY BEFORE BREAKFAST 08/08/19   Mahala Menghini, PA-C  vitamin B-12 (CYANOCOBALAMIN) 50 MCG tablet Take 50 mcg by mouth daily.    [provider]  zolpidem (AMBIEN) 5 MG tablet Take 5 mg by mouth at bedtime as needed for sleep.    [provider]    Allergies    Azithromycin  Review of Systems   Review of Systems  Constitutional: Negative for chills and fever.  HENT: Negative for congestion, rhinorrhea and sore throat.   Eyes: Negative for visual disturbance.  Respiratory: Negative for cough and shortness of breath.  Cardiovascular: Negative for chest pain and leg swelling.  Gastrointestinal: Positive for abdominal pain and nausea. Negative for diarrhea and vomiting.  Genitourinary: Negative for dysuria.  Musculoskeletal: Negative for back pain and neck pain.  Skin: Positive for color change. Negative for rash.  Neurological: Negative for dizziness, light-headedness and headaches.  Hematological: Does not bruise/bleed easily.  Psychiatric/Behavioral: Negative for confusion.    Physical Exam Updated Vital Signs BP 135/65 (BP Location: Right Arm)   Pulse 70   Temp 98.7 F (37.1 C) (Oral)   Resp (!) 23   Ht 1.626 m (5\' 4" )   Wt (!) 146.1 kg   SpO2 93%   BMI 55.27 kg/m   Physical Exam Vitals and nursing note reviewed.  Constitutional:      General: She is not in acute distress.    Appearance: She is well-developed. She is obese.  HENT:     Head: Normocephalic and atraumatic.  Eyes:     General: Scleral icterus present.     Conjunctiva/sclera: Conjunctivae normal.  Cardiovascular:     Rate and Rhythm:  Normal rate and regular rhythm.     Heart sounds: No murmur heard.   Pulmonary:     Effort: Pulmonary effort is normal. No respiratory distress.     Breath sounds: Normal breath sounds.  Abdominal:     Palpations: Abdomen is soft.     Tenderness: There is abdominal tenderness.     Comments: Nontender to palpation right upper quadrant may be some mild or slight guarding.  Musculoskeletal:     Cervical back: Neck supple.  Skin:    General: Skin is warm and dry.     Capillary Refill: Capillary refill takes less than 2 seconds.     Coloration: Skin is jaundiced.  Neurological:     General: No focal deficit present.     Mental Status: She is alert and oriented to person, place, and time.     Cranial Nerves: No cranial nerve deficit.     Sensory: No sensory deficit.     Motor: No weakness.     ED Results / Procedures / Treatments   Labs (all labs ordered are listed, but only abnormal results are displayed) Labs Reviewed  COMPREHENSIVE METABOLIC PANEL - Abnormal; Notable for the following components:      Result Value   Glucose, Bld 142 (*)    Creatinine, Ser 0.42 (*)    Albumin 3.3 (*)    AST 177 (*)    ALT 270 (*)    Alkaline Phosphatase 506 (*)    Total Bilirubin 7.4 (*)    All other components within normal limits  CBC - Abnormal; Notable for the following components:   WBC 15.7 (*)    Platelets 467 (*)    All other components within normal limits  URINALYSIS, ROUTINE W REFLEX MICROSCOPIC - Abnormal; Notable for the following components:   Color, Urine AMBER (*)    Glucose, UA 100 (*)    Bilirubin Urine MODERATE (*)    All other components within normal limits  CULTURE, BLOOD (ROUTINE X 2)  CULTURE, BLOOD (ROUTINE X 2)  RESPIRATORY PANEL BY RT PCR (FLU A&B, COVID)  LIPASE, BLOOD  PREGNANCY, URINE    EKG EKG Interpretation  Date/Time:  Tuesday August 10 2020 16:31:20 EDT Ventricular Rate:  68 PR Interval:    QRS Duration: 104 QT Interval:  425 QTC  Calculation: 452 R Axis:   55 Text Interpretation: Sinus rhythm Low voltage, extremity and precordial  leads Minimal ST depression, inferior leads Confirmed by Fredia Sorrow [phone]) on 08/10/2020 4:36:55 PM   Radiology CT Abdomen Pelvis W Contrast  Result Date: 08/10/2020 CLINICAL DATA:  Epigastric abdominal pain and fever. EXAM: CT ABDOMEN AND PELVIS WITH CONTRAST TECHNIQUE: Multidetector CT imaging of the abdomen and pelvis was performed using the standard protocol following bolus administration of intravenous contrast. CONTRAST:  174mL OMNIPAQUE IOHEXOL 300 MG/ML  SOLN COMPARISON:  None. FINDINGS: Lower chest: The lung bases are clear of an acute process. Minimal streaky basilar atelectasis or scarring changes. The heart is upper limits of normal in size. No pericardial effusion. The distal esophagus is grossly normal. Hepatobiliary: No hepatic lesions are identified. Mild intrahepatic biliary dilatation is noted. The gallbladder is distended and demonstrates gallbladder wall thickening. There is also mild pericholecystic inflammatory changes. Gas containing gallstones are noted in the gallbladder. The largest measures 2.3 cm. The common bile duct is dilated to a maximum of 15 mm. Findings suspicious for a obstructing distal common bile duct stone. ERCP or MRCP may be helpful for further evaluation and treatment. Pancreas: No mass, inflammation or ductal dilatation. Prominent fatty interstices. Spleen: Normal size.  No focal lesions. Adrenals/Urinary Tract: The adrenal glands and kidneys are unremarkable. No worrisome renal lesions or obstructing ureteral calculi. The bladder is unremarkable. Stomach/Bowel: Surgical changes are noted involving the stomach from gastric bypass surgery. The duodenum, small bowel and colon are grossly normal without oral contrast. No acute inflammatory process, mass lesions or obstructive findings. Colonic diverticulosis without findings for acute diverticulitis.  Vascular/Lymphatic: The aorta is normal in caliber. No dissection. The branch vessels are patent. The major venous structures are patent. No mesenteric or retroperitoneal mass or adenopathy. Small scattered lymph nodes are noted. Reproductive: The uterus and left ovary are unremarkable. The right ovary is surgically absent. Other: Periumbilical abdominal wall hernia containing fat and a small bowel loop. No findings for incarceration or obstruction. Musculoskeletal: No significant bony findings. IMPRESSION: 1. CT findings consistent with acute calculus cholecystitis. There is also common bile duct dilatation and probable distal common bile duct stone. 2. ERCP or MRCP may be helpful for further evaluation and treatment. 3. Periumbilical abdominal wall hernia containing fat and a small bowel loop. No findings for incarceration or obstruction. 4. Surgical changes from gastric bypass surgery. Electronically Signed   By: Marijo Sanes M.D.   On: 08/10/2020 17:37    Procedures Procedures (including critical care time)  CRITICAL CARE Performed by: Fredia Sorrow Total critical care time: 35 minutes Critical care time was exclusive of separately billable procedures and treating other patients. Critical care was necessary to treat or prevent imminent or life-threatening deterioration. Critical care was time spent personally by me on the following activities: development of treatment plan with patient and/or surrogate as well as nursing, discussions with consultants, evaluation of patient's response to treatment, examination of patient, obtaining history from patient or surrogate, ordering and performing treatments and interventions, ordering and review of laboratory studies, ordering and review of radiographic studies, pulse oximetry and re-evaluation of patient's condition.   Medications Ordered in ED Medications  piperacillin-tazobactam (ZOSYN) IVPB 3.375 g (3.375 g Intravenous New Bag/Given 08/10/20 1855)    0.9 %  sodium chloride infusion (250 mLs Intravenous New Bag/Given 08/10/20 1853)  ondansetron (ZOFRAN) injection 4 mg (4 mg Intravenous Given 08/10/20 1723)  iohexol (OMNIPAQUE) 300 MG/ML solution 100 mL (100 mLs Intravenous Contrast Given 08/10/20 1700)  ondansetron (ZOFRAN) injection 4 mg (4 mg Intravenous Given 08/10/20 1848)  HYDROmorphone (  DILAUDID) injection 1 mg (1 mg Intravenous Given 08/10/20 1849)    ED Course  I have reviewed the triage vital signs and the nursing notes.  Pertinent labs & imaging results that were available during my care of the patient were reviewed by me and considered in my medical decision making (see chart for details).    MDM Rules/Calculators/A&P                          Originally was thinking patient would be admitted to [facility] spoke spoke with Dr. Michail Sermon on-call for [facility] and then spoke with Dr. Ninfa Linden on-call for [facility] surgery.  But then when spoke to the hospitalist for admission the only bed available is at [facility].  Patient will be admitted to [facility].  Have recontacted gastroenterology and general surgery.  Covid testing has been done.  Labs consistent with the CT findings which show acute cholecystitis along with probable common bile duct stone.  Patient still with some mild tenderness right upper quadrant.  Patient given some hydromorphone for the pain.  Also blood cultures done and given the antibiotics Zosyn.  Due to the patient's gastric bypass ERCP may be difficult.  But when I was speaking to [facility] they were willing to give it a try in the morning.  We will see what [facility] says.  Discussed with Dr. Loletha Carrow from [facility].  He will see her in consultation but feels that ERCP is probably not something that could be done.  But does agree with the admission overnight to [facility] and the current plan.  Also discussed with Dr. Windle Guard general surgery at [facility].  She will see the patient in  consultation in the morning.  Overall patient nontoxic no acute distress.  Final Clinical Impression(s) / ED Diagnoses Final diagnoses:  Choledocholithiasis    Rx / DC Orders ED Discharge Orders    None       Fredia Sorrow, MD 08/10/20 1954    Fredia Sorrow, MD 08/10/20 Lona Kettle    Fredia Sorrow, MD 08/10/20 2012

## 2020-08-10 NOTE — ED Triage Notes (Signed)
Fever at night for a week. Epigastric pain for a week. She was seen at [facility] for dark urine and she was treated with an antibiotic with improvement. Yesterday the pain started again along with dark urine. Her skin is yellow.

## 2020-08-10 NOTE — ED Notes (Signed)
PT placed on 2L [place] and sat up in the bed per Dr.z PT 86% O2 when checked. PT now at 97% O2 on [place].

## 2020-08-10 NOTE — Progress Notes (Signed)
Patient arrived to unit transported via [facility] from [place]. Report received Delrae Alfred, RN. Paged admission of patients presence. MD aware. Stated will place orders.

## 2020-08-11 ENCOUNTER — Encounter (HOSPITAL_COMMUNITY): Payer: Self-pay | Admitting: Family Medicine

## 2020-08-11 ENCOUNTER — Inpatient Hospital Stay (HOSPITAL_COMMUNITY): Payer: Managed Care, Other (non HMO)

## 2020-08-11 DIAGNOSIS — Z9884 Bariatric surgery status: Secondary | ICD-10-CM

## 2020-08-11 DIAGNOSIS — K8050 Calculus of bile duct without cholangitis or cholecystitis without obstruction: Secondary | ICD-10-CM

## 2020-08-11 DIAGNOSIS — I10 Essential (primary) hypertension: Secondary | ICD-10-CM

## 2020-08-11 DIAGNOSIS — K810 Acute cholecystitis: Secondary | ICD-10-CM | POA: Diagnosis not present

## 2020-08-11 HISTORY — PX: IR BILIARY DRAIN PLACEMENT WITH CHOLANGIOGRAM: IMG6043

## 2020-08-11 LAB — BLOOD CULTURE ID PANEL (REFLEXED) - BCID2

## 2020-08-11 LAB — HEPATIC FUNCTION PANEL
ALT: 224 U/L — ABNORMAL HIGH (ref 0–44)
AST: 133 U/L — ABNORMAL HIGH (ref 15–41)
Albumin: 2.8 g/dL — ABNORMAL LOW (ref 3.5–5.0)
Alkaline Phosphatase: 476 U/L — ABNORMAL HIGH (ref 38–126)
Bilirubin, Direct: 5.2 mg/dL — ABNORMAL HIGH (ref 0.0–0.2)
Indirect Bilirubin: 2.8 mg/dL — ABNORMAL HIGH (ref 0.3–0.9)
Total Bilirubin: 8 mg/dL — ABNORMAL HIGH (ref 0.3–1.2)
Total Protein: 7 g/dL (ref 6.5–8.1)

## 2020-08-11 LAB — BASIC METABOLIC PANEL
Anion gap: 13 (ref 5–15)
BUN: 9 mg/dL (ref 6–20)
CO2: 23 mmol/L (ref 22–32)
Calcium: 9.1 mg/dL (ref 8.9–10.3)
Chloride: 103 mmol/L (ref 98–111)
Creatinine, Ser: 0.83 mg/dL (ref 0.44–1.00)
GFR calc non Af Amer: >60 mL/min (ref >60)
Glucose, Bld: 125 mg/dL — ABNORMAL HIGH (ref 70–99)
Potassium: 3.5 mmol/L (ref 3.5–5.1)
Sodium: 139 mmol/L (ref 135–145)

## 2020-08-11 LAB — CBC
HCT: 41 % (ref 36.0–46.0)
Hemoglobin: 12.5 g/dL (ref 12.0–15.0)
MCH: 29.8 pg (ref 26.0–34.0)
MCHC: 30.5 g/dL (ref 30.0–36.0)
MCV: 97.9 fL (ref 80.0–100.0)
Platelets: 400 10*3/uL (ref 150–400)
RBC: 4.19 MIL/uL (ref 3.87–5.11)
RDW: 14.5 % (ref 11.5–15.5)
WBC: 27.7 10*3/uL — ABNORMAL HIGH (ref 4.0–10.5)
nRBC: 0 % (ref 0.0–0.2)

## 2020-08-11 LAB — GLUCOSE, CAPILLARY
Glucose-Capillary: 105 mg/dL — ABNORMAL HIGH (ref 70–99)
Glucose-Capillary: 132 mg/dL — ABNORMAL HIGH (ref 70–99)
Glucose-Capillary: 135 mg/dL — ABNORMAL HIGH (ref 70–99)

## 2020-08-11 LAB — HIV ANTIBODY (ROUTINE TESTING W REFLEX): HIV Screen 4th Generation wRfx: NONREACTIVE

## 2020-08-11 LAB — PROTIME-INR
INR: 1.3 — ABNORMAL HIGH (ref 0.8–1.2)
Prothrombin Time: 15.3 seconds — ABNORMAL HIGH (ref 11.4–15.2)

## 2020-08-11 MED ORDER — METOPROLOL TARTRATE 5 MG/5ML IV SOLN
5.0000 mg | Freq: Four times a day (QID) | INTRAVENOUS | Status: DC
  Administered 2020-08-11 – 2020-08-12 (×5): 5 mg via INTRAVENOUS
  Filled 2020-08-11 (×5): qty 5

## 2020-08-11 MED ORDER — ONDANSETRON HCL 4 MG PO TABS: 4.0000 mg | ORAL_TABLET | Freq: Four times a day (QID) | ORAL | Status: DC | PRN

## 2020-08-11 MED ORDER — ONDANSETRON HCL 4 MG/2ML IJ SOLN: 4.0000 mg | Freq: Four times a day (QID) | INTRAMUSCULAR | Status: DC | PRN

## 2020-08-11 MED ORDER — PIPERACILLIN-TAZOBACTAM 3.375 G IVPB
3.3750 g | Freq: Once | INTRAVENOUS | Status: AC
  Administered 2020-08-11: 3.375 g via INTRAVENOUS
  Filled 2020-08-11: qty 50

## 2020-08-11 MED ORDER — FENTANYL CITRATE (PF) 100 MCG/2ML IJ SOLN
INTRAMUSCULAR | Status: AC
Start: 2020-08-11 — End: 2020-08-12
  Filled 2020-08-11: qty 2

## 2020-08-11 MED ORDER — DEXTROSE-NACL 5-0.9 % IV SOLN: INTRAVENOUS | Status: AC

## 2020-08-11 MED ORDER — MIDAZOLAM HCL 2 MG/2ML IJ SOLN
INTRAMUSCULAR | Status: AC
  Filled 2020-08-11: qty 2

## 2020-08-11 MED ORDER — FENTANYL CITRATE (PF) 100 MCG/2ML IJ SOLN
INTRAMUSCULAR | Status: AC | PRN
  Administered 2020-08-11: 25 ug via INTRAVENOUS
  Administered 2020-08-11: 50 ug via INTRAVENOUS

## 2020-08-11 MED ORDER — LIDOCAINE HCL (PF) 1 % IJ SOLN
INTRAMUSCULAR | Status: AC
  Filled 2020-08-11: qty 30

## 2020-08-11 MED ORDER — MIDAZOLAM HCL 2 MG/2ML IJ SOLN
INTRAMUSCULAR | Status: AC | PRN
  Administered 2020-08-11 (×2): 1 mg via INTRAVENOUS

## 2020-08-11 MED ORDER — MORPHINE SULFATE (PF) 2 MG/ML IV SOLN
1.0000 mg | INTRAVENOUS | Status: DC | PRN
  Administered 2020-08-11 – 2020-08-13 (×9): 1 mg via INTRAVENOUS
  Filled 2020-08-11 (×9): qty 1

## 2020-08-11 MED ORDER — ALBUTEROL SULFATE HFA 108 (90 BASE) MCG/ACT IN AERS: 1.0000 | INHALATION_SPRAY | Freq: Four times a day (QID) | RESPIRATORY_TRACT | Status: DC | PRN

## 2020-08-11 MED ORDER — PIPERACILLIN-TAZOBACTAM 3.375 G IVPB
3.3750 g | Freq: Three times a day (TID) | INTRAVENOUS | Status: DC
  Administered 2020-08-11 – 2020-08-13 (×7): 3.375 g via INTRAVENOUS
  Filled 2020-08-11 (×8): qty 50

## 2020-08-11 MED ORDER — IOHEXOL 300 MG/ML  SOLN
50.0000 mL | Freq: Once | INTRAMUSCULAR | Status: AC | PRN
  Administered 2020-08-11: 15 mL

## 2020-08-11 NOTE — Progress Notes (Deleted)
Cardiology Office Note    Date:  08/11/2020   ID:  Meghan Macdonald, DOB 04/28/1961, MRN [id number]  PCP:  Robyne Peers, MD  Cardiologist: Kate Sable, MD (Inactive) EPS: None  No chief complaint on file.   History of Present Illness:  Meghan Macdonald is a 59 y.o. 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    Past Medical History:  Diagnosis Date  . Anxiety   . Arthritis    knees  . Asthma    exertion or exteme cold.  . Back pain   . Carcinoid tumor of appendix   . Complication of anesthesia    SOB after second surgery 4 days after gastric bypass  . Depression   . Dysrhythmia    idiopathic arthy; had plapitations since a child. On metoprolol for 9 years;  . Essential hypertension, benign   . GERD (gastroesophageal reflux disease)   . Headache   . History of kidney stones   . History of migraine headaches   . IBS (irritable bowel syndrome)   . Insomnia   . Iron deficiency anemia   . Vitamin B deficiency     Past Surgical History:  Procedure Laterality Date  . APPENDECTOMY  2001   carcinoid tumor, found during gastric bypass.  Marland Kitchen BREAST BIOPSY Right   . COLONOSCOPY WITH ESOPHAGOGASTRODUODENOSCOPY (EGD)  2008   Dr. Collene Mares: s/p right hemicolectomy, sigmoid colon diverticulosis, otherwise unremarkable. EGD unremarkable, s/p gastric bypass, No Barrett's  . COLONOSCOPY WITH PROPOFOL N/A 11/10/2016   Procedure: COLONOSCOPY WITH PROPOFOL;  Surgeon: Daneil Dolin, MD;  [facility];  Service: Endoscopy;  Laterality: N/A;  115  . CYSTOSCOPY    . ESOPHAGOGASTRODUODENOSCOPY (EGD) WITH PROPOFOL N/A 11/10/2016   Dr. Gala Romney: Because of changes found in the esophagus, as any weighted undulating Z line but no Barrett's, small bowel biopsy negative for celiac, Billroth II  configuration found. Esophagus dilated per history of dysphagia. Colonoscopy showed status post right hemicolectomy, scattered diverticula, 2 polyps removed from the rectum, one tubular adenoma. Next colonoscopy 5 years, January 2023  . GASTRIC BYPASS  2001   incidental finding of carcinoid tumor of appendix at time of surgery with appendectomy  . MALONEY DILATION N/A 11/10/2016   Procedure: Venia Minks DILATION;  Surgeon: Daneil Dolin, MD;  [facility];  Service: Endoscopy;  Laterality: N/A;  . REPLACEMENT TOTAL KNEE Left 2006  . Rhinologic surgery    . RIGHT OOPHORECTOMY     removal of mesentary and part of colon and uterus with removal of appendix; 4 days after gastric bypass  . TONSILLECTOMY AND ADENOIDECTOMY      Current Medications: No outpatient medications have been marked as taking for the 08/16/20 encounter (Appointment) with Imogene Burn, PA-C.     Allergies:   Azithromycin   Social History   Socioeconomic History  . Marital status: Divorced    Spouse name: Not on file  . Number of children: Not on file  . Years of education: 12+  . Highest education level: Not on file  Occupational History    Employer: [employer]  Tobacco Use  . Smoking status: Never Smoker  . Smokeless tobacco: Never Used  Vaping Use  . Vaping Use: Never used  Substance and Sexual Activity  . Alcohol use: Yes  Alcohol/week: 2.0 standard drinks    Types: 2 Glasses of wine per week    Comment: Regular alcohol intake, 5 days a week. Quit December 2017  . Drug use: No  . Sexual activity: Never    Birth control/protection: None  Other Topics Concern  . Not on file  Social History Narrative  . Not on file   Social Determinants of Health   Financial Resource Strain:   . Difficulty of Paying Living Expenses: Not on file  Food Insecurity:   . Worried About Charity fundraiser in the Last Year: Not on file  . Ran Out of Food in the Last Year: Not on file  Transportation  Needs:   . Lack of Transportation (Medical): Not on file  . Lack of Transportation (Non-Medical): Not on file  Physical Activity:   . Days of Exercise per Week: Not on file  . Minutes of Exercise per Session: Not on file  Stress:   . Feeling of Stress : Not on file  Social Connections:   . Frequency of Communication with Friends and Family: Not on file  . Frequency of Social Gatherings with Friends and Family: Not on file  . Attends Religious Services: Not on file  . Active Member of Clubs or Organizations: Not on file  . Attends Archivist Meetings: Not on file  . Marital Status: Not on file     Family History:  The patient's ***family history includes Asthma in her mother; Colon cancer (age of onset: 44) in her maternal grandmother; Hyperlipidemia in her father and mother; Lung cancer in her father; Thyroid disease in her mother.   ROS:   Please see the history of present illness.    ROS All other systems reviewed and are negative.   PHYSICAL EXAM:   VS:  There were no vitals taken for this visit.  Physical Exam  GEN: Well nourished, well developed, in no acute distress  HEENT: normal  Neck: no JVD, carotid bruits, or masses Cardiac:RRR; no murmurs, rubs, or gallops  Respiratory:  clear to auscultation bilaterally, normal work of breathing GI: soft, nontender, nondistended, + BS Ext: without cyanosis, clubbing, or edema, Good distal pulses bilaterally MS: no deformity or atrophy  Skin: warm and dry, no rash Neuro:  Alert and Oriented x 3, Strength and sensation are intact Psych: euthymic mood, full affect  Wt Readings from Last 3 Encounters:  08/10/20 (!) 322 lb (146.1 kg)  08/08/19 (!) 311 lb 12.8 oz (141.4 kg)  12/09/18 (!) 301 lb (136.5 kg)      Studies/Labs Reviewed:   EKG:  EKG is*** ordered today.  The ekg ordered today demonstrates ***  Recent Labs: 08/11/2020: ALT 224; BUN 9; Creatinine, Ser 0.83; Hemoglobin 12.5; Platelets 400; Potassium 3.5;  Sodium 139   Lipid Panel No results found for: CHOL, TRIG, HDL, CHOLHDL, VLDL, LDLCALC, LDLDIRECT  Additional studies/ records that were reviewed today include:  ***    ASSESSMENT:    No diagnosis found.   PLAN:  In order of problems listed above:  PSVT on Toprol-XL  Hypertension  Morbid obesity  Medication Adjustments/Labs and Tests Ordered: Current medicines are reviewed at length with the patient today.  Concerns regarding medicines are outlined above.  Medication changes, Labs and Tests ordered today are listed in the Patient Instructions below. There are no Patient Instructions on file for this visit.   Signed, Ermalinda Barrios, PA-C  08/11/2020 11:10 AM    [facility]  [address], [place], [place]  [address] Phone: [phone]; Fax: [phone]    

## 2020-08-11 NOTE — H&P (View-Only) (Signed)
[facility] Consult: 8:28 AM 08/11/2020  LOS: 1 day    Referring Provider: Dr. Rodena Piety  Primary Care Physician:  Robyne Peers, MD in [place].   Primary Gastroenterologist:  Dr. Gala Romney in [place]   Reason for Consultation:  Cholecystitis and Choledocholithiasis   HPI: Meghan Macdonald is a 59 y.o. female with a past medical history of SVT and gastric bypass, who presented to the ER at [facility] yesterday with a complaint of abdominal pain.    Today, the patient explains that her symptoms started about 2 weeks ago, 07/27/2020 when she started having some night sweats with a fever and would wake up "drenched", after 2 days she was very achy all over her body and by the third day she felt some better until she ate lunch and started with an epigastric/right upper quadrant pain which was an 8-9/10 and lasted for a few hours, she then noticed that she had very dark urine.  Also reports a clay colored stool around that time as well.  She presented to the urgent care and was diagnosed with a UTI and put on antibiotics.  This seemed to help things over the next week, but then on Monday, 10/4//2021 after eating breakfast she started again with pain and on Tuesday had dark urine and started with fevers again.  This worsened over the next 24 hours and pain seems to radiate through to the middle of her back, rated as a 10/10 at its worst.  Associated symptoms include nausea and slight constipation.  Patient noted that on Monday and Tuesday the pain is definitely worse within an hour or 2 after eating.  Describes a fever of 100.3 in the ER and some overnight sweating but her pain is mostly under control on medications here.    Denies vomiting, similar symptoms previously or blood in her stool.  ED course: CT abdomen  pelvis which shows features concerning for cholecystitis and possible CBD stone, LFTs elevated with total bilirubin of 7.4, AST 177, ALT 270 and alk phos 506, white count 15.7. COVID negative   GI History: 11/2016 colonoscopy, screening study.  Scattered diverticulosis sigmoid, descending, transverse.  7 mm and 4 mm polyps removed from rectum.   11/11/19 EGD. For dysphagia, anemia.   Mucosal changes were found in the esophagus. accentuated undulating Z line. No esophagitis. No stricture seen. Bilroth 2 anatomy.  Past Medical History:  Diagnosis Date  . Anxiety   . Arthritis    knees  . Asthma    exertion or exteme cold.  . Back pain   . Carcinoid tumor of appendix   . Complication of anesthesia    SOB after second surgery 4 days after gastric bypass  . Depression   . Dysrhythmia    idiopathic arthy; had plapitations since a child. On metoprolol for 9 years;  . Essential hypertension, benign   . GERD (gastroesophageal reflux disease)   . Headache   . History of kidney stones   . History of migraine headaches   . IBS (irritable bowel  syndrome)   . Insomnia   . Iron deficiency anemia   . Vitamin B deficiency     Past Surgical History:  Procedure Laterality Date  . APPENDECTOMY  2001   carcinoid tumor, found during gastric bypass.  Marland Kitchen BREAST BIOPSY Right   . COLONOSCOPY WITH ESOPHAGOGASTRODUODENOSCOPY (EGD)  2008   Dr. Collene Mares: s/p right hemicolectomy, sigmoid colon diverticulosis, otherwise unremarkable. EGD unremarkable, s/p gastric bypass, No Barrett's  . COLONOSCOPY WITH PROPOFOL N/A 11/10/2016   Procedure: COLONOSCOPY WITH PROPOFOL;  Surgeon: Daneil Dolin, MD;  Location: [facility];  Service: Endoscopy;  Laterality: N/A;  115  . CYSTOSCOPY    . ESOPHAGOGASTRODUODENOSCOPY (EGD) WITH PROPOFOL N/A 11/10/2016   Dr. Gala Romney: Because of changes found in the esophagus, as any weighted undulating Z line but no Barrett's, small bowel biopsy negative for celiac, Billroth II configuration  found. Esophagus dilated per history of dysphagia. Colonoscopy showed status post right hemicolectomy, scattered diverticula, 2 polyps removed from the rectum, one tubular adenoma. Next colonoscopy 5 years, January 2023  . GASTRIC BYPASS  2001   incidental finding of carcinoid tumor of appendix at time of surgery with appendectomy  . MALONEY DILATION N/A 11/10/2016   Procedure: Venia Minks DILATION;  Surgeon: Daneil Dolin, MD;  Location: [facility];  Service: Endoscopy;  Laterality: N/A;  . REPLACEMENT TOTAL KNEE Left 2006  . Rhinologic surgery    . RIGHT OOPHORECTOMY     removal of mesentary and part of colon and uterus with removal of appendix; 4 days after gastric bypass  . TONSILLECTOMY AND ADENOIDECTOMY      Prior to Admission medications   Medication Sig Start Date End Date Taking? Authorizing Provider  DULoxetine (CYMBALTA) 60 MG capsule Take by mouth. 05/14/17  Yes [provider]  acetaminophen (TYLENOL) 500 MG tablet Take 500 mg by mouth every 6 (six) hours as needed (for pain/headache.).    [provider]  albuterol (PROVENTIL HFA;VENTOLIN HFA) 108 (90 BASE) MCG/ACT inhaler Inhale 1-2 puffs into the lungs every 6 (six) hours as needed for wheezing or shortness of breath.    [provider]  cholecalciferol (VITAMIN D) 1000 units tablet Take 1,000 Units by mouth daily.    [provider]  Fexofenadine-Pseudoephedrine (ALLEGRA-D 24 HOUR PO) Take by mouth.    [provider]  fluticasone (FLONASE) 50 MCG/ACT nasal spray Place 1 spray into both nostrils daily as needed for allergies or rhinitis.     [provider]  metoprolol succinate (TOPROL-XL) 100 MG 24 hr tablet Take 1 tablet (100 mg total) by mouth daily. Take with or immediately following a meal. 06/16/20 09/14/20  Strader, Fransisco Hertz, PA-C  Multiple Vitamin (MULTIVITAMIN) tablet Take 1 tablet by mouth daily.    [provider]  Multiple Vitamins-Minerals (PRESERVISION  AREDS 2+MULTI VIT PO) Take by mouth 2 (two) times daily.    [provider]  naproxen sodium (ANAPROX) 220 MG tablet Take 220-440 mg by mouth 2 (two) times daily as needed (for pain/headache.).    [provider]  RABEprazole (ACIPHEX) 20 MG tablet TAKE 1 TABLET BY MOUTH ONCE DAILY BEFORE BREAKFAST 08/08/19   Mahala Menghini, PA-C  vitamin B-12 (CYANOCOBALAMIN) 50 MCG tablet Take 50 mcg by mouth daily.    [provider]  zolpidem (AMBIEN) 5 MG tablet Take 5 mg by mouth at bedtime as needed for sleep.    [provider]    Scheduled Meds: . metoprolol tartrate  5 mg Intravenous  Q6H   Infusions: . dextrose 5 % and 0.9% NaCl Stopped (08/11/20 0557)  . piperacillin-tazobactam (ZOSYN)  IV 3.375 g (08/11/20 0754)   PRN Meds: albuterol, morphine injection, ondansetron **OR** ondansetron (ZOFRAN) IV   Allergies as of 08/10/2020 - Review Complete 08/10/2020  Allergen Reaction Noted  . Azithromycin Other (See Comments) 03/10/2015    Family History  Problem Relation Age of Onset  . Asthma Mother   . Hyperlipidemia Mother   . Thyroid disease Mother   . Hyperlipidemia Father   . Lung cancer Father   . Colon cancer Maternal Grandmother 72  . Breast cancer Neg Hx     Social History   Socioeconomic History  . Marital status: Divorced    Spouse name: Not on file  . Number of children: Not on file  . Years of education: 12+  . Highest education level: Not on file  Occupational History    Employer: [employer]  Tobacco Use  . Smoking status: Never Smoker  . Smokeless tobacco: Never Used  Vaping Use  . Vaping Use: Never used  Substance and Sexual Activity  . Alcohol use: Yes    Alcohol/week: 2.0 standard drinks    Types: 2 Glasses of wine per week    Comment: Regular alcohol intake, 5 days a week. Quit December 2017  . Drug use: No  . Sexual activity: Never    Birth control/protection: None  Other Topics Concern  . Not on file    Social History Narrative  . Not on file   Social Determinants of Health   Financial Resource Strain:   . Difficulty of Paying Living Expenses: Not on file  Food Insecurity:   . Worried About Charity fundraiser in the Last Year: Not on file  . Ran Out of Food in the Last Year: Not on file  Transportation Needs:   . Lack of Transportation (Medical): Not on file  . Lack of Transportation (Non-Medical): Not on file  Physical Activity:   . Days of Exercise per Week: Not on file  . Minutes of Exercise per Session: Not on file  Stress:   . Feeling of Stress : Not on file  Social Connections:   . Frequency of Communication with Friends and Family: Not on file  . Frequency of Social Gatherings with Friends and Family: Not on file  . Attends Religious Services: Not on file  . Active Member of Clubs or Organizations: Not on file  . Attends Archivist Meetings: Not on file  . Marital Status: Not on file  Intimate Partner Violence:   . Fear of Current or Ex-Partner: Not on file  . Emotionally Abused: Not on file  . Physically Abused: Not on file  . Sexually Abused: Not on file    REVIEW OF SYSTEMS: Constitutional:  See HPI ENT:  No nose bleeds Pulm:  No SOB or cough CV:  No palpitations, no LE edema.  GU:  No hematuria, no frequency GI:  See HPI Heme:  No bleeding or bruising   Neuro:  No headaches, no peripheral tingling or numbness Derm:  No itching, no rash or sores.  Endocrine:  No polyuria or dysuria Travel:  None beyond local counties in last few months.    Physical Exam:  Vital signs in last 24 hours: Temp:  [98 F (36.7 C)-100.3 F (37.9 C)] 98 F (36.7 C) (10/06 0442) Pulse Rate:  [62-93] 75 (10/06 0442) Resp:  [15-27] 16 (10/06 0442) BP: (112-143)/(63-85)  112/70 (10/06 0442) SpO2:  [84 %-98 %] 93 % (10/06 0442) Weight:  [146.1 kg] 146.1 kg (10/05 1600) Last BM Date: 08/10/20 General:   Pleasant morbidly obese, jaundiced, Caucasian female appears to  be in NAD, Well developed, Well nourished, alert and cooperative Head:  Normocephalic and atraumatic. Eyes:   PEERL, EOMI. icteric. Conjunctiva pink. Ears:  Normal auditory acuity. Neck:  Supple Throat: Oral cavity and pharynx without inflammation, swelling or lesion.  Lungs: Respirations even and unlabored. Lungs clear to auscultation bilaterally.   No wheezes, crackles, or rhonchi.  Heart: Normal S1, S2. No MRG. Regular rate and rhythm. No peripheral edema, cyanosis or pallor.  Abdomen:  Soft, nondistended, moderate RUQ/Epigastric ttp with involuntary guarding. Normal bowel sounds. No appreciable masses or hepatomegaly. Rectal:  Not performed.  Msk:  Symmetrical without gross deformities. Peripheral pulses intact.  Extremities:  Without edema, no deformity or joint abnormality.  Neurologic:  Alert and  oriented x4;  grossly normal neurologically.  Skin:   Dry and intact without significant lesions or rashes. Psychiatric: Demonstrates good judgement and reason without abnormal affect or behaviors.   LAB RESULTS: Recent Labs    08/10/20 1608 08/11/20 0101  WBC 15.7* 27.7*  HGB 13.4 12.5  HCT 42.4 41.0  PLT 467* 400   BMET Recent Labs    08/10/20 1608 08/11/20 0101  NA 137 139  K 3.8 3.5  CL 98 103  CO2 25 23  GLUCOSE 142* 125*  BUN 10 9  CREATININE 0.42* 0.83  CALCIUM 9.0 9.1   LFT Recent Labs    08/11/20 0101  PROT 7.0  ALBUMIN 2.8*  AST 133*  ALT 224*  ALKPHOS 476*  BILITOT 8.0*  BILIDIR 5.2*  IBILI 2.8*   STUDIES: CT Abdomen Pelvis W Contrast  Result Date: 08/10/2020 CLINICAL DATA:  Epigastric abdominal pain and fever. EXAM: CT ABDOMEN AND PELVIS WITH CONTRAST TECHNIQUE: Multidetector CT imaging of the abdomen and pelvis was performed using the standard protocol following bolus administration of intravenous contrast. CONTRAST:  11m OMNIPAQUE IOHEXOL 300 MG/ML  SOLN COMPARISON:  None. FINDINGS: Lower chest: The lung bases are clear of an acute process.  Minimal streaky basilar atelectasis or scarring changes. The heart is upper limits of normal in size. No pericardial effusion. The distal esophagus is grossly normal. Hepatobiliary: No hepatic lesions are identified. Mild intrahepatic biliary dilatation is noted. The gallbladder is distended and demonstrates gallbladder wall thickening. There is also mild pericholecystic inflammatory changes. Gas containing gallstones are noted in the gallbladder. The largest measures 2.3 cm. The common bile duct is dilated to a maximum of 15 mm. Findings suspicious for a obstructing distal common bile duct stone. ERCP or MRCP may be helpful for further evaluation and treatment. Pancreas: No mass, inflammation or ductal dilatation. Prominent fatty interstices. Spleen: Normal size.  No focal lesions. Adrenals/Urinary Tract: The adrenal glands and kidneys are unremarkable. No worrisome renal lesions or obstructing ureteral calculi. The bladder is unremarkable. Stomach/Bowel: Surgical changes are noted involving the stomach from gastric bypass surgery. The duodenum, small bowel and colon are grossly normal without oral contrast. No acute inflammatory process, mass lesions or obstructive findings. Colonic diverticulosis without findings for acute diverticulitis. Vascular/Lymphatic: The aorta is normal in caliber. No dissection. The branch vessels are patent. The major venous structures are patent. No mesenteric or retroperitoneal mass or adenopathy. Small scattered lymph nodes are noted. Reproductive: The uterus and left ovary are unremarkable. The right ovary is surgically absent. Other: Periumbilical abdominal wall hernia containing  fat and a small bowel loop. No findings for incarceration or obstruction. Musculoskeletal: No significant bony findings. IMPRESSION: 1. CT findings consistent with acute calculus cholecystitis. There is also common bile duct dilatation and probable distal common bile duct stone. 2. ERCP or MRCP may be  helpful for further evaluation and treatment. 3. Periumbilical abdominal wall hernia containing fat and a small bowel loop. No findings for incarceration or obstruction. 4. Surgical changes from gastric bypass surgery. Electronically Signed   By: Marijo Sanes M.D.   On: 08/10/2020 17:37   MR ABDOMEN MRCP WO CONTRAST  Result Date: 08/11/2020 CLINICAL DATA:  Cholelithiasis with biliary dilatation. Evaluate for CBD stone. EXAM: MRI ABDOMEN WITHOUT CONTRAST  (INCLUDING MRCP) TECHNIQUE: Multiplanar multisequence MR imaging of the abdomen was performed. Heavily T2-weighted images of the biliary and pancreatic ducts were obtained, and three-dimensional MRCP images were rendered by post processing. COMPARISON:  CT scan 08/10/2020. FINDINGS: Lower chest: Dependent atelectasis bilaterally Hepatobiliary: No suspicious focal abnormality within the liver parenchyma. Mild intrahepatic biliary duct dilatation evident. Gallbladder is distended with gallbladder wall irregularity and mild pericholecystic edema. Multiple stones identified in the lumen of the gallbladder measuring in the 2.0-2.5 cm size range. Common bile duct dilated up to 13 mm diameter. 7 mm stone is identified in the distal common bile duct, at the ampulla (well demonstrated on coronal image 23 of series 5). Pancreas: No focal mass lesion. No dilatation of the main duct. No intraparenchymal cyst. No peripancreatic edema. Spleen:  No splenomegaly. No focal mass lesion. Adrenals/Urinary Tract: No adrenal nodule or mass. Kidneys unremarkable. Stomach/Bowel: Stomach is unremarkable. No gastric wall thickening. No evidence of outlet obstruction. Duodenum is normally positioned as is the ligament of Treitz. No small bowel or colonic dilatation within the visualized abdomen. Vascular/Lymphatic: No abdominal aortic aneurysm. There is no gastrohepatic or hepatoduodenal ligament lymphadenopathy. No retroperitoneal or mesenteric lymphadenopathy. Other:  No  intraperitoneal free fluid. Musculoskeletal: No suspicious marrow signal abnormality within the visualized bony anatomy. IMPRESSION: 1. 7 mm stone identified in the distal common bile duct. Associated intra and extrahepatic biliary duct dilatation with common bile duct diameter measuring up to 13 mm. 2. Cholelithiasis with gallbladder wall irregularity and mild pericholecystic edema. Acute cholecystitis not excluded. Electronically Signed   By: Misty Stanley M.D.   On: 08/11/2020 07:12   MR 3D Recon At Scanner  Result Date: 08/11/2020 CLINICAL DATA:  Cholelithiasis with biliary dilatation. Evaluate for CBD stone. EXAM: MRI ABDOMEN WITHOUT CONTRAST  (INCLUDING MRCP) TECHNIQUE: Multiplanar multisequence MR imaging of the abdomen was performed. Heavily T2-weighted images of the biliary and pancreatic ducts were obtained, and three-dimensional MRCP images were rendered by post processing. COMPARISON:  CT scan 08/10/2020. FINDINGS: Lower chest: Dependent atelectasis bilaterally Hepatobiliary: No suspicious focal abnormality within the liver parenchyma. Mild intrahepatic biliary duct dilatation evident. Gallbladder is distended with gallbladder wall irregularity and mild pericholecystic edema. Multiple stones identified in the lumen of the gallbladder measuring in the 2.0-2.5 cm size range. Common bile duct dilated up to 13 mm diameter. 7 mm stone is identified in the distal common bile duct, at the ampulla (well demonstrated on coronal image 23 of series 5). Pancreas: No focal mass lesion. No dilatation of the main duct. No intraparenchymal cyst. No peripancreatic edema. Spleen:  No splenomegaly. No focal mass lesion. Adrenals/Urinary Tract: No adrenal nodule or mass. Kidneys unremarkable. Stomach/Bowel: Stomach is unremarkable. No gastric wall thickening. No evidence of outlet obstruction. Duodenum is normally positioned as is the ligament of Treitz. No  small bowel or colonic dilatation within the visualized  abdomen. Vascular/Lymphatic: No abdominal aortic aneurysm. There is no gastrohepatic or hepatoduodenal ligament lymphadenopathy. No retroperitoneal or mesenteric lymphadenopathy. Other:  No intraperitoneal free fluid. Musculoskeletal: No suspicious marrow signal abnormality within the visualized bony anatomy. IMPRESSION: 1. 7 mm stone identified in the distal common bile duct. Associated intra and extrahepatic biliary duct dilatation with common bile duct diameter measuring up to 13 mm. 2. Cholelithiasis with gallbladder wall irregularity and mild pericholecystic edema. Acute cholecystitis not excluded. Electronically Signed   By: Misty Stanley M.D.   On: 08/11/2020 07:12    IMPRESSION:   1.  Acute cholecystitis with CBD stone: Elevated LFTs-AST 177--> 133, ALT 270--> 224, alk phos 506--> 476, total bilirubin 7.4--> 8.0; WBC count 15.7 at time of admission-->27.7 overnight, patient with history of fever and chills with right upper quadrant/epigastric pain over the last 2 weeks, history of gastric bypass as well 2.  History of paroxysmal SVT 3.  History of gastric bypass   PLAN:     1.  Dr. Rush Landmark is willing to proceed with ERCP, but only if this is done in coordination with the surgical team so that he can get access to the remnant stomach.  He does have time to do this tomorrow morning.  We are trying to discuss with surgery to see if this is something they are willing to do.  If they are not then patient may need to be transferred versus placing a percutaneous tube at first 2.  Continue supportive measures 3.  Continue antibiotics and pain medications as well as antiemetics 4.  Patient to remain n.p.o. for now 5.  Please await final recommendations from Dr. Havery Moros later today  Thank you for your kind consultation, we will continue to follow.   Ellouise Newer, PA-C 08/11/20 9:27am

## 2020-08-11 NOTE — Progress Notes (Signed)
59 year old female admitted today with acute cholecystitis and 7 mm stone in the distal common bile duct associated with intra and extrahepatic biliary duct dilatation.  Seen by GI and general surgery.  Initially was planning to transfer her to [place] called the [facility] transfer line at [phone].  Discussed with GI later and was planning to have IR place a percutaneous bile duct drain and for possible trial of ERCP tomorrow.  Will hold off on transfer at this time.  Follow-up labs in a.m.

## 2020-08-11 NOTE — Plan of Care (Signed)

## 2020-08-11 NOTE — Procedures (Signed)
Interventional Radiology Procedure Note  Procedure: Percutaneous cholangiogram with external biliary drain placement  Complications: None  Estimated Blood Loss: < 10 mL  Findings: 10.2 Fr left external biliary drain (multipurpose pigtail catheter with string cut) in place.  Cholangiogram significant for mild intrahepatic biliary ductal dilation and distal common bile duct stone.  Unable to traverse stone with catheter/wire without causing abdominal pain, therefore external drain only placed.  Plan: Keep to bag drainage. Please record output from drain. Agree with continuing to trend Tbili, CBC. Definitive long term plan as per Gastroenterology/Surgery pending ability to perform ERCP. Interventional Radiology will follow.   Ruthann Cancer, MD Pager: [phone]

## 2020-08-11 NOTE — Progress Notes (Signed)
PHARMACY - PHYSICIAN COMMUNICATION CRITICAL VALUE ALERT - BLOOD CULTURE IDENTIFICATION (BCID)  BCID with staph epi in 1/4 bottles, mec A/C detected.    Name of physician (or Provider) ContactedStark Klein NP Changes to prescribed antibiotics required: No changes as likely contaminant.   Erin Hearing PharmD., BCPS Clinical Pharmacist 08/11/2020 9:33 PM

## 2020-08-11 NOTE — H&P (Signed)
History and Physical    Meghan Macdonald TJQ:[id number] DOB: 1961/02/25 DOA: 08/10/2020  PCP: Robyne Peers, MD  Patient coming from: Home.  Chief Complaint: Abdominal pain.  HPI: Meghan Macdonald is a 59 y.o. female with history of gastric bypass and paroxysmal supraventricular tachycardia on metoprolol presents to the ER at [facility] with complaints of abdominal pain.  Patient states her symptoms started about 2 weeks ago when patient had abdominal discomfort and urine was concentrated.  Patient had gone to the urgent care and was given antibiotics for urine tract infection.  Symptoms initially improved but over the last 2 days patient started getting abdominal pain mostly in the right upper quadrant particularly when eating.  Some nausea.  Denies any diarrhea.  Patient also start developing fever chills and at this time was concerned about the symptoms presents to the ER.  ED Course: In the ER patient had CT abdomen pelvis which shows features concerning for cholecystitis and also possible CBD stone.  LFTs were elevated with total bilirubin of 7.4 AST of 177 ALT of 270 alkaline phosphatase of 506.  WBC count was 15.7.  On-call general surgery was consulted patient started on empiric antibiotics fluids and admitted for further management.  Covid test was negative.  Review of Systems: As per HPI, rest all negative.   Past Medical History:  Diagnosis Date  . Anxiety   . Arthritis    knees  . Asthma    exertion or exteme cold.  . Back pain   . Carcinoid tumor of appendix   . Complication of anesthesia    SOB after second surgery 4 days after gastric bypass  . Depression   . Dysrhythmia    idiopathic arthy; had plapitations since a child. On metoprolol for 9 years;  . Essential hypertension, benign   . GERD (gastroesophageal reflux disease)   . Headache   . History of kidney stones   . History of migraine headaches   . IBS (irritable bowel syndrome)   . Insomnia     . Iron deficiency anemia   . Vitamin B deficiency     Past Surgical History:  Procedure Laterality Date  . APPENDECTOMY  2001   carcinoid tumor, found during gastric bypass.  Marland Kitchen BREAST BIOPSY Right   . COLONOSCOPY WITH ESOPHAGOGASTRODUODENOSCOPY (EGD)  2008   Dr. Collene Mares: s/p right hemicolectomy, sigmoid colon diverticulosis, otherwise unremarkable. EGD unremarkable, s/p gastric bypass, No Barrett's  . COLONOSCOPY WITH PROPOFOL N/A 11/10/2016   Procedure: COLONOSCOPY WITH PROPOFOL;  Surgeon: Daneil Dolin, MD;  Location: [facility];  Service: Endoscopy;  Laterality: N/A;  115  . CYSTOSCOPY    . ESOPHAGOGASTRODUODENOSCOPY (EGD) WITH PROPOFOL N/A 11/10/2016   Dr. 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 Esophagus dilated per history of dysphagia. Colonoscopy showed status post right hemicolectomy, scattered diverticula, 2 polyps removed from the rectum, one tubular adenoma. Next colonoscopy 5 years, January 2023  . GASTRIC BYPASS  2001   incidental finding of carcinoid tumor of appendix at time of surgery with appendectomy  . MALONEY DILATION N/A 11/10/2016   Procedure: Venia Minks DILATION;  Surgeon: Daneil Dolin, MD;  Location: [facility];  Service: Endoscopy;  Laterality: N/A;  . REPLACEMENT TOTAL KNEE Left 2006  . Rhinologic surgery    . RIGHT OOPHORECTOMY     removal of mesentary and  part of colon and uterus with removal of appendix; 4 days after gastric bypass  . TONSILLECTOMY AND ADENOIDECTOMY       reports that she has never smoked. She has never used smokeless tobacco. She reports current alcohol use of about 2.0 standard drinks of alcohol per week. She reports that she does not use drugs.  Allergies  Allergen Reactions  . Azithromycin Other (See Comments)    Upset stomach    Family History  Problem Relation Age of Onset  . Asthma Mother   .  Hyperlipidemia Mother   . Thyroid disease Mother   . Hyperlipidemia Father   . Lung cancer Father   . Colon cancer Maternal Grandmother 68  . Breast cancer Neg Hx     Prior to Admission medications   Medication Sig Start Date End Date Taking? Authorizing Provider  DULoxetine (CYMBALTA) 60 MG capsule Take by mouth. 05/14/17  Yes [provider]  acetaminophen (TYLENOL) 500 MG tablet Take 500 mg by mouth every 6 (six) hours as needed (for pain/headache.).    [provider]  albuterol (PROVENTIL HFA;VENTOLIN HFA) 108 (90 BASE) MCG/ACT inhaler Inhale 1-2 puffs into the lungs every 6 (six) hours as needed for wheezing or shortness of breath.    [provider]  cholecalciferol (VITAMIN D) 1000 units tablet Take 1,000 Units by mouth daily.    [provider]  Fexofenadine-Pseudoephedrine (ALLEGRA-D 24 HOUR PO) Take by mouth.    [provider]  fluticasone (FLONASE) 50 MCG/ACT nasal spray Place 1 spray into both nostrils daily as needed for allergies or rhinitis.     [provider]  metoprolol succinate (TOPROL-XL) 100 MG 24 hr tablet Take 1 tablet (100 mg total) by mouth daily. Take with or immediately following a meal. 06/16/20 09/14/20  Strader, Fransisco Hertz, PA-C  Multiple Vitamin (MULTIVITAMIN) tablet Take 1 tablet by mouth daily.    [provider]  Multiple Vitamins-Minerals (PRESERVISION AREDS 2+MULTI VIT PO) Take by mouth 2 (two) times daily.    [provider]  naproxen sodium (ANAPROX) 220 MG tablet Take 220-440 mg by mouth 2 (two) times daily as needed (for pain/headache.).    [provider]  RABEprazole (ACIPHEX) 20 MG tablet TAKE 1 TABLET BY MOUTH ONCE DAILY BEFORE BREAKFAST 08/08/19   Mahala Menghini, PA-C  vitamin B-12 (CYANOCOBALAMIN) 50 MCG tablet Take 50 mcg by mouth daily.    [provider]  zolpidem (AMBIEN) 5 MG tablet Take 5 mg by mouth at bedtime as needed for sleep.    [provider]    Physical Exam: Constitutional: Moderately built and nourished. Vitals:   08/10/20 2212 08/10/20 2217 08/10/20 2217 08/10/20 2310  BP:  121/63  134/85  Pulse: 93   90  Resp: (!) 25   18  Temp:   100.3 F (37.9 C) 99.8 F (37.7 C)  TempSrc:   Oral Oral  SpO2: (!) 84%  95% 91%  Weight:      Height:       Eyes: Anicteric no pallor. ENMT: No discharge from the ears eyes nose or mouth. Neck: No mass felt.  No neck rigidity. Respiratory: No rhonchi or crepitations. Cardiovascular: S1-S2 heard. Abdomen: Mild epigastric tenderness and right upper quadrant tenderness.  No guarding or rigidity. Musculoskeletal: No edema. Skin: No rash. Neurologic: Alert awake oriented to time place and person.  Moves all extremities. Psychiatric: Appears normal.  Normal affect.   Labs on Admission: I have personally reviewed following  labs and imaging studies  CBC: Recent Labs  Lab 08/10/20 1608  WBC 15.7*  HGB 13.4  HCT 42.4  MCV 97.7  PLT 637*   Basic Metabolic Panel: Recent Labs  Lab 08/10/20 1608  NA 137  K 3.8  CL 98  CO2 25  GLUCOSE 142*  BUN 10  CREATININE 0.42*  CALCIUM 9.0   GFR: Estimated Creatinine Clearance: 110.5 mL/min (A) (by C-G formula based on SCr of 0.42 mg/dL (L)). Liver Function Tests: Recent Labs  Lab 08/10/20 1608  AST 177*  ALT 270*  ALKPHOS 506*  BILITOT 7.4*  PROT 7.4  ALBUMIN 3.3*   Recent Labs  Lab 08/10/20 1608  LIPASE 23   No results for input(s): AMMONIA in the last 168 hours. Coagulation Profile: No results for input(s): INR, PROTIME in the last 168 hours. Cardiac Enzymes: No results for input(s): CKTOTAL, CKMB, CKMBINDEX, TROPONINI in the last 168 hours. BNP (last 3 results) No results for input(s): PROBNP in the last 8760 hours. HbA1C: No results for input(s): HGBA1C in the last 72 hours. CBG: No results for input(s): GLUCAP in the last 168 hours. Lipid Profile: No results for input(s): CHOL, HDL, LDLCALC, TRIG, CHOLHDL,  LDLDIRECT in the last 72 hours. Thyroid Function Tests: No results for input(s): TSH, T4TOTAL, FREET4, T3FREE, THYROIDAB in the last 72 hours. Anemia Panel: No results for input(s): VITAMINB12, FOLATE, FERRITIN, TIBC, IRON, RETICCTPCT in the last 72 hours. Urine analysis:    Component Value Date/Time   COLORURINE AMBER (A) 08/10/2020 1608   APPEARANCEUR CLEAR 08/10/2020 1608   LABSPEC 1.015 08/10/2020 1608   PHURINE 7.0 08/10/2020 1608   GLUCOSEU 100 (A) 08/10/2020 1608   HGBUR NEGATIVE 08/10/2020 1608   BILIRUBINUR MODERATE (A) 08/10/2020 1608   KETONESUR NEGATIVE 08/10/2020 1608   PROTEINUR NEGATIVE 08/10/2020 1608   UROBILINOGEN 0.2 02/10/2010 2014   NITRITE NEGATIVE 08/10/2020 1608   LEUKOCYTESUR NEGATIVE 08/10/2020 1608   Sepsis Labs: @LABRCNTIP (procalcitonin:4,lacticidven:4) ) Recent Results (from the past 240 hour(s))  Respiratory Panel by RT PCR (Flu A&B, Covid) - Nasopharyngeal Swab     Status: None   Collection Time: 08/10/20  7:52 PM   Specimen: Nasopharyngeal Swab  Result Value Ref Range Status   SARS Coronavirus 2 by RT PCR NEGATIVE NEGATIVE Final    Comment: (NOTE) SARS-CoV-2 target nucleic acids are NOT DETECTED.  The SARS-CoV-2 RNA is generally detectable in upper respiratoy specimens during the acute phase of infection. The lowest concentration of SARS-CoV-2 viral copies this assay can detect is 131 copies/mL. A negative result does not preclude SARS-Cov-2 infection and should not be used as the sole basis for treatment or other patient management decisions. A negative result may occur with  improper specimen collection/handling, submission of specimen other than nasopharyngeal swab, presence of viral mutation(s) within the areas targeted by this assay, and inadequate number of viral copies (<131 copies/mL). A negative result must be combined with clinical observations, patient history, and epidemiological information. The expected result is  Negative.  Fact Sheet for Patients:  PinkCheek.be  Fact Sheet for Healthcare Providers:  GravelBags.it  This test is no t yet approved or cleared by the [place] FDA and  has been authorized for detection and/or diagnosis of SARS-CoV-2 by FDA under an Emergency Use Authorization (EUA). This EUA will remain  in effect (meaning this test can be used) for the duration of the COVID-19 declaration under Section 564(b)(1) of the Act, 21 U.S.C. section 360bbb-3(b)(1), unless the authorization is terminated or revoked  sooner.     Influenza A by PCR NEGATIVE NEGATIVE Final   Influenza B by PCR NEGATIVE NEGATIVE Final    Comment: (NOTE) The Xpert Xpress SARS-CoV-2/FLU/RSV assay is intended as an aid in  the diagnosis of influenza from Nasopharyngeal swab specimens and  should not be used as a sole basis for treatment. Nasal washings and  aspirates are unacceptable for Xpert Xpress SARS-CoV-2/FLU/RSV  testing.  Fact Sheet for Patients: PinkCheek.be  Fact Sheet for Healthcare Providers: GravelBags.it  This test is not yet approved or cleared by the [place] FDA and  has been authorized for detection and/or diagnosis of SARS-CoV-2 by  FDA under an Emergency Use Authorization (EUA). This EUA will remain  in effect (meaning this test can be used) for the duration of the  Covid-19 declaration under Section 564(b)(1) of the Act, 21  U.S.C. section 360bbb-3(b)(1), unless the authorization is  terminated or revoked. Performed at [facility], [address] [place], [place] [address]      Radiological Exams on Admission: CT Abdomen Pelvis W Contrast  Result Date: 08/10/2020 CLINICAL DATA:  Epigastric abdominal pain and fever. EXAM: CT ABDOMEN AND PELVIS WITH CONTRAST TECHNIQUE: Multidetector CT imaging of the abdomen and pelvis was performed using the  standard protocol following bolus administration of intravenous contrast. CONTRAST:  19mL OMNIPAQUE IOHEXOL 300 MG/ML  SOLN COMPARISON:  None. FINDINGS: Lower chest: The lung bases are clear of an acute process. Minimal streaky basilar atelectasis or scarring changes. The heart is upper limits of normal in size. No pericardial effusion. The distal esophagus is grossly normal. Hepatobiliary: No hepatic lesions are identified. Mild intrahepatic biliary dilatation is noted. The gallbladder is distended and demonstrates gallbladder wall thickening. There is also mild pericholecystic inflammatory changes. Gas containing gallstones are noted in the gallbladder. The largest measures 2.3 cm. The common bile duct is dilated to a maximum of 15 mm. Findings suspicious for a obstructing distal common bile duct stone. ERCP or MRCP may be helpful for further evaluation and treatment. Pancreas: No mass, inflammation or ductal dilatation. Prominent fatty interstices. Spleen: Normal size.  No focal lesions. Adrenals/Urinary Tract: The adrenal glands and kidneys are unremarkable. No worrisome renal lesions or obstructing ureteral calculi. The bladder is unremarkable. Stomach/Bowel: Surgical changes are noted involving the stomach from gastric bypass surgery. The duodenum, small bowel and colon are grossly normal without oral contrast. No acute inflammatory process, mass lesions or obstructive findings. Colonic diverticulosis without findings for acute diverticulitis. Vascular/Lymphatic: The aorta is normal in caliber. No dissection. The branch vessels are patent. The major venous structures are patent. No mesenteric or retroperitoneal mass or adenopathy. Small scattered lymph nodes are noted. Reproductive: The uterus and left ovary are unremarkable. The right ovary is surgically absent. Other: Periumbilical abdominal wall hernia containing fat and a small bowel loop. No findings for incarceration or obstruction. Musculoskeletal: No  significant bony findings. IMPRESSION: 1. CT findings consistent with acute calculus cholecystitis. There is also common bile duct dilatation and probable distal common bile duct stone. 2. ERCP or MRCP may be helpful for further evaluation and treatment. 3. Periumbilical abdominal wall hernia containing fat and a small bowel loop. No findings for incarceration or obstruction. 4. Surgical changes from gastric bypass surgery. Electronically Signed   By: Marijo Sanes M.D.   On: 08/10/2020 17:37    EKG: Independently reviewed.  Normal sinus rhythm.  Assessment/Plan Active Problems:   Essential hypertension, benign   Acute cholecystitis   Choledocholithiasis  1. Acute cholecystitis with possible CBD stone for which patient is kept n.p.o. antibiotics and general surgery has been consulted.  I have ordered MRCP. 2. History of paroxysmal supraventricular tachycardia presently n.p.o.  Patient usually takes oral metoprolol and I have dosed IV metoprolol for now until patient can take orally. 3. History of gastric bypass.  Given that patient has acute cholecystitis with possible CBD stone will need close monitoring for any further worsening in inpatient status.   DVT prophylaxis: SCDs.  Will avoid anticoagulation in anticipation of possible surgery or procedure. Code Status: Full code. Family Communication: Discussed with patient. Disposition Plan: Home. Consults called: General surgery. Admission status: Inpatient.   Rise Patience MD [facility] Pager [phone].  If 7PM-7AM, please contact night-coverage [email or web address] Password [email or web address]  08/11/2020, 12:11 AM

## 2020-08-11 NOTE — Consult Note (Signed)
Chief Complaint: Patient was seen in consultation today for percutaneous transhepatic cholangiogram with biliary drain placement Chief Complaint  Patient presents with  . Abdominal Pain    Referring Physician(s): Armbruster,S  Supervising Physician: Suttle,D  Patient Status: [facility]  History of Present Illness: Meghan Macdonald is a 59 y.o. 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  MRI of abdomen/MRCP today revealed 7 mm stone in the distal common bile duct with associated intra and extrahepatic biliary ductal dilatation, cholelithiasis with gallbladder wall irregularity and mild pericholecystic edema, cholecystitis not excluded.  Prior imaging  also revealed periumbilical abdominal wall hernia containing fat and small bowel loop with no signs of incarceration or obstruction.  Patient's Roux-en-Y anatomy prohibits standard ERCP.  She has reported fevers at home prior to admission along with epigastric pain and nausea.  Current labs include WBC 27.7, hemoglobin 12.5, platelets 400K, T 15.3, INR 1.3, creatinine normal, total bilirubin 8, COVID-19 negative.  Blood cultures negative to date.  Request now received from GI team for PTC with biliary drainage in the interim until patient can be accepted to another tertiary care facility for further evaluation .  Past Medical History:  Diagnosis Date  . Anxiety   . Arthritis    knees  . Asthma    exertion or exteme cold.  . Back pain   . Carcinoid tumor of appendix   . Complication of anesthesia    SOB after second surgery 4 days after gastric bypass  . Depression   . Dysrhythmia    idiopathic  arthy; had plapitations since a child. On metoprolol for 9 years;  . Essential hypertension, benign   . GERD (gastroesophageal reflux disease)   . Headache   . History of kidney stones   . History of migraine headaches   . IBS (irritable bowel syndrome)   . Insomnia   . Iron deficiency anemia   . Vitamin B deficiency     Past Surgical History:  Procedure Laterality Date  . APPENDECTOMY  2001   carcinoid tumor, found during gastric bypass.  Marland Kitchen BREAST BIOPSY Right   . COLONOSCOPY WITH ESOPHAGOGASTRODUODENOSCOPY (EGD)  2008   Dr. Collene Mares: s/p right hemicolectomy, sigmoid colon diverticulosis, otherwise unremarkable. EGD unremarkable, s/p gastric bypass, No Barrett's  . COLONOSCOPY WITH PROPOFOL N/A 11/10/2016   Procedure: COLONOSCOPY WITH PROPOFOL;  Surgeon: Daneil Dolin, MD;  [facility]: [facility];  Service: Endoscopy;  Laterality: N/A;  115  . CYSTOSCOPY    . ESOPHAGOGASTRODUODENOSCOPY (EGD) WITH PROPOFOL N/A 11/10/2016   Dr. 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 Esophagus dilated per history of dysphagia. Colonoscopy showed status post right hemicolectomy, scattered diverticula, 2 polyps removed from the rectum, one tubular adenoma. Next colonoscopy 5 years, January 2023  . GASTRIC BYPASS  2001   incidental finding of carcinoid tumor of appendix at time of surgery with appendectomy  . MALONEY DILATION N/A 11/10/2016   Procedure: Venia Minks DILATION;  Surgeon: Daneil Dolin, MD;  Location: [facility];  Service: Endoscopy;  Laterality: N/A;  . REPLACEMENT TOTAL KNEE Left 2006  . Rhinologic surgery    . RIGHT OOPHORECTOMY  removal of mesentary and part of colon and uterus with removal of appendix; 4 days after gastric bypass  . TONSILLECTOMY AND ADENOIDECTOMY      Allergies: Azithromycin  Medications: Prior to Admission medications   Medication  Sig Start Date End Date Taking? Authorizing Provider  acetaminophen (TYLENOL) 500 MG tablet Take 500 mg by mouth every 6 (six) hours as needed (for pain/headache.).   Yes [provider]  albuterol (PROVENTIL HFA;VENTOLIN HFA) 108 (90 BASE) MCG/ACT inhaler Inhale 1-2 puffs into the lungs every 6 (six) hours as needed for wheezing or shortness of breath.   Yes [provider]  cefdinir (OMNICEF) 300 MG capsule Take 300 mg by mouth 2 (two) times daily.  07/31/20  Yes [provider]  cholecalciferol (VITAMIN D) 1000 units tablet Take 1,000 Units by mouth daily.   Yes [provider]  DULoxetine (CYMBALTA) 60 MG capsule Take 60 mg by mouth 2 (two) times daily.  05/14/17  Yes [provider]  fexofenadine-pseudoephedrine (ALLEGRA-D 24) 180-240 MG 24 hr tablet Take 1 tablet by mouth daily as needed (for allergies).   Yes [provider]  fluticasone (FLONASE) 50 MCG/ACT nasal spray Place 1 spray into both nostrils daily as needed for allergies or rhinitis.    Yes [provider]  metoprolol succinate (TOPROL-XL) 100 MG 24 hr tablet Take 1 tablet (100 mg total) by mouth daily. Take with or immediately following a meal. 06/16/20 09/14/20 Yes Strader, [place] M, PA-C  Multiple Vitamins-Minerals (BARIATRIC MULTIVITAMINS/IRON) CAPS Take 1 capsule by mouth in the morning, at noon, and at bedtime.   Yes [provider]  RABEprazole (ACIPHEX) 20 MG tablet TAKE 1 TABLET BY MOUTH ONCE DAILY BEFORE BREAKFAST Patient taking differently: Take 20 mg by mouth daily.  08/08/19  Yes Mahala Menghini, PA-C  vitamin B-12 (CYANOCOBALAMIN) 50 MCG tablet Take 50 mcg by mouth daily.   Yes [provider]  zolpidem (AMBIEN) 5 MG tablet Take 5 mg by mouth at bedtime as needed for sleep.   Yes [provider]     Family History  Problem Relation Age of Onset  . Asthma Mother   . Hyperlipidemia Mother   . Thyroid disease Mother   .  Hyperlipidemia Father   . Lung cancer Father   . Colon cancer Maternal Grandmother 39  . Breast cancer Neg Hx     Social History   Socioeconomic History  . Marital status: Divorced    Spouse name: Not on file  . Number of children: Not on file  . Years of education: 12+  . Highest education level: Not on file  Occupational History    Employer: [employer]  Tobacco Use  . Smoking status: Never Smoker  . Smokeless tobacco: Never Used  Vaping Use  . Vaping Use: Never used  Substance and Sexual Activity  . Alcohol use: Yes    Alcohol/week: 2.0 standard drinks    Types: 2 Glasses of wine per week    Comment: Regular alcohol intake, 5 days a week. Quit December 2017  . Drug use: No  . Sexual activity: Never    Birth control/protection: None  Other Topics Concern  . Not on file  Social History Narrative  . Not on file   Social Determinants of Health   Financial Resource Strain:   . Difficulty of Paying Living Expenses: Not on file  Food Insecurity:   . Worried About Charity fundraiser in the Last Year: Not on  file  . [place] in the Last Year: Not on file  Transportation Needs:   . Lack of Transportation (Medical): Not on file  . Lack of Transportation (Non-Medical): Not on file  Physical Activity:   . Days of Exercise per Week: Not on file  . Minutes of Exercise per Session: Not on file  Stress:   . Feeling of Stress : Not on file  Social Connections:   . Frequency of Communication with Friends and Family: Not on file  . Frequency of Social Gatherings with Friends and Family: Not on file  . Attends Religious Services: Not on file  . Active Member of Clubs or Organizations: Not on file  . Attends Archivist Meetings: Not on file  . Marital Status: Not on file      Review of Systems see above: Currently denies chest pain, worsening dyspnea, cough, back pain, vomiting or bleeding.  She does have occasional headaches  Vital Signs: BP  122/63 (BP Location: Left Arm)   Pulse 76   Temp 98 F (36.7 C) (Oral)   Resp 16   Ht 5' 4"  (1.626 m)   Wt (!) 322 lb (146.1 kg)   SpO2 96%   BMI 55.27 kg/m   Physical Exam awake, alert.  Chest clear to auscultation bilaterally anteriorly.  Heart with regular rate/ rhythm.  Abdomen obese, soft, positive bowel sounds, tender epigastric/ right upper quadrant regions to palpation.  Extremities with full range of motion.  Imaging: CT Abdomen Pelvis W Contrast  Result Date: 08/10/2020 CLINICAL DATA:  Epigastric abdominal pain and fever. EXAM: CT ABDOMEN AND PELVIS WITH CONTRAST TECHNIQUE: Multidetector CT imaging of the abdomen and pelvis was performed using the standard protocol following bolus administration of intravenous contrast. CONTRAST:  164m OMNIPAQUE IOHEXOL 300 MG/ML  SOLN COMPARISON:  None. FINDINGS: Lower chest: The lung bases are clear of an acute process. Minimal streaky basilar atelectasis or scarring changes. The heart is upper limits of normal in size. No pericardial effusion. The distal esophagus is grossly normal. Hepatobiliary: No hepatic lesions are identified. Mild intrahepatic biliary dilatation is noted. The gallbladder is distended and demonstrates gallbladder wall thickening. There is also mild pericholecystic inflammatory changes. Gas containing gallstones are noted in the gallbladder. The largest measures 2.3 cm. The common bile duct is dilated to a maximum of 15 mm. Findings suspicious for a obstructing distal common bile duct stone. ERCP or MRCP may be helpful for further evaluation and treatment. Pancreas: No mass, inflammation or ductal dilatation. Prominent fatty interstices. Spleen: Normal size.  No focal lesions. Adrenals/Urinary Tract: The adrenal glands and kidneys are unremarkable. No worrisome renal lesions or obstructing ureteral calculi. The bladder is unremarkable. Stomach/Bowel: Surgical changes are noted involving the stomach from gastric bypass surgery. The  duodenum, small bowel and colon are grossly normal without oral contrast. No acute inflammatory process, mass lesions or obstructive findings. Colonic diverticulosis without findings for acute diverticulitis. Vascular/Lymphatic: The aorta is normal in caliber. No dissection. The branch vessels are patent. The major venous structures are patent. No mesenteric or retroperitoneal mass or adenopathy. Small scattered lymph nodes are noted. Reproductive: The uterus and left ovary are unremarkable. The right ovary is surgically absent. Other: Periumbilical abdominal wall hernia containing fat and a small bowel loop. No findings for incarceration or obstruction. Musculoskeletal: No significant bony findings. IMPRESSION: 1. CT findings consistent with acute calculus cholecystitis. There is also common bile duct dilatation and probable distal common bile duct stone. 2. ERCP  or MRCP may be helpful for further evaluation and treatment. 3. Periumbilical abdominal wall hernia containing fat and a small bowel loop. No findings for incarceration or obstruction. 4. Surgical changes from gastric bypass surgery. Electronically Signed   By: Marijo Sanes M.D.   On: 08/10/2020 17:37   MR ABDOMEN MRCP WO CONTRAST  Result Date: 08/11/2020 CLINICAL DATA:  Cholelithiasis with biliary dilatation. Evaluate for CBD stone. EXAM: MRI ABDOMEN WITHOUT CONTRAST  (INCLUDING MRCP) TECHNIQUE: Multiplanar multisequence MR imaging of the abdomen was performed. Heavily T2-weighted images of the biliary and pancreatic ducts were obtained, and three-dimensional MRCP images were rendered by post processing. COMPARISON:  CT scan 08/10/2020. FINDINGS: Lower chest: Dependent atelectasis bilaterally Hepatobiliary: No suspicious focal abnormality within the liver parenchyma. Mild intrahepatic biliary duct dilatation evident. Gallbladder is distended with gallbladder wall irregularity and mild pericholecystic edema. Multiple stones identified in the lumen of  the gallbladder measuring in the 2.0-2.5 cm size range. Common bile duct dilated up to 13 mm diameter. 7 mm stone is identified in the distal common bile duct, at the ampulla (well demonstrated on coronal image 23 of series 5). Pancreas: No focal mass lesion. No dilatation of the main duct. No intraparenchymal cyst. No peripancreatic edema. Spleen:  No splenomegaly. No focal mass lesion. Adrenals/Urinary Tract: No adrenal nodule or mass. Kidneys unremarkable. Stomach/Bowel: Stomach is unremarkable. No gastric wall thickening. No evidence of outlet obstruction. Duodenum is normally positioned as is the ligament of Treitz. No small bowel or colonic dilatation within the visualized abdomen. Vascular/Lymphatic: No abdominal aortic aneurysm. There is no gastrohepatic or hepatoduodenal ligament lymphadenopathy. No retroperitoneal or mesenteric lymphadenopathy. Other:  No intraperitoneal free fluid. Musculoskeletal: No suspicious marrow signal abnormality within the visualized bony anatomy. IMPRESSION: 1. 7 mm stone identified in the distal common bile duct. Associated intra and extrahepatic biliary duct dilatation with common bile duct diameter measuring up to 13 mm. 2. Cholelithiasis with gallbladder wall irregularity and mild pericholecystic edema. Acute cholecystitis not excluded. Electronically Signed   By: Misty Stanley M.D.   On: 08/11/2020 07:12   MR 3D Recon At Scanner  Result Date: 08/11/2020 CLINICAL DATA:  Cholelithiasis with biliary dilatation. Evaluate for CBD stone. EXAM: MRI ABDOMEN WITHOUT CONTRAST  (INCLUDING MRCP) TECHNIQUE: Multiplanar multisequence MR imaging of the abdomen was performed. Heavily T2-weighted images of the biliary and pancreatic ducts were obtained, and three-dimensional MRCP images were rendered by post processing. COMPARISON:  CT scan 08/10/2020. FINDINGS: Lower chest: Dependent atelectasis bilaterally Hepatobiliary: No suspicious focal abnormality within the liver parenchyma.  Mild intrahepatic biliary duct dilatation evident. Gallbladder is distended with gallbladder wall irregularity and mild pericholecystic edema. Multiple stones identified in the lumen of the gallbladder measuring in the 2.0-2.5 cm size range. Common bile duct dilated up to 13 mm diameter. 7 mm stone is identified in the distal common bile duct, at the ampulla (well demonstrated on coronal image 23 of series 5). Pancreas: No focal mass lesion. No dilatation of the main duct. No intraparenchymal cyst. No peripancreatic edema. Spleen:  No splenomegaly. No focal mass lesion. Adrenals/Urinary Tract: No adrenal nodule or mass. Kidneys unremarkable. Stomach/Bowel: Stomach is unremarkable. No gastric wall thickening. No evidence of outlet obstruction. Duodenum is normally positioned as is the ligament of Treitz. No small bowel or colonic dilatation within the visualized abdomen. Vascular/Lymphatic: No abdominal aortic aneurysm. There is no gastrohepatic or hepatoduodenal ligament lymphadenopathy. No retroperitoneal or mesenteric lymphadenopathy. Other:  No intraperitoneal free fluid. Musculoskeletal: No suspicious marrow signal abnormality within the visualized  bony anatomy. IMPRESSION: 1. 7 mm stone identified in the distal common bile duct. Associated intra and extrahepatic biliary duct dilatation with common bile duct diameter measuring up to 13 mm. 2. Cholelithiasis with gallbladder wall irregularity and mild pericholecystic edema. Acute cholecystitis not excluded. Electronically Signed   By: Misty Stanley M.D.   On: 08/11/2020 07:12    Labs:  CBC: Recent Labs    08/10/20 1608 08/11/20 0101  WBC 15.7* 27.7*  HGB 13.4 12.5  HCT 42.4 41.0  PLT 467* 400    COAGS: Recent Labs    08/11/20 1222  INR 1.3*    BMP: Recent Labs    08/10/20 1608 08/11/20 0101  NA 137 139  K 3.8 3.5  CL 98 103  CO2 25 23  GLUCOSE 142* 125*  BUN 10 9  CALCIUM 9.0 9.1  CREATININE 0.42* 0.83  GFRNONAA >60 >60     LIVER FUNCTION TESTS: Recent Labs    08/10/20 1608 08/11/20 0101  BILITOT 7.4* 8.0*  AST 177* 133*  ALT 270* 224*  ALKPHOS 506* 476*  PROT 7.4 7.0  ALBUMIN 3.3* 2.8*    TUMOR MARKERS: No results for input(s): AFPTM, CEA, CA199, CHROMGRNA in the last 8760 hours.  Assessment and Plan: 59 y.o. 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  MRI of abdomen/MRCP today revealed 7 mm stone in the distal common bile duct with associated intra and extrahepatic biliary ductal dilatation, cholelithiasis with gallbladder wall irregularity and mild pericholecystic edema, cholecystitis not excluded.  Prior imaging  also revealed periumbilical abdominal wall hernia containing fat and small bowel loop with no signs of incarceration or obstruction.  Patient's Roux-en-Y anatomy prohibits standard ERCP.  She has reported fevers at home prior to admission along with epigastric pain and nausea.  Current labs include WBC 27.7, hemoglobin 12.5, platelets 400K, T 15.3, INR 1.3, creatinine normal, total bilirubin 8, COVID-19 negative.  Blood cultures negative to date.  Request now received from GI team for PTC with biliary drainage in the interim until patient can be accepted to another tertiary care facility for further evaluation .  Latest imaging studies have been reviewed by Dr. Serafina Royals.  Details/risks of procedure, including but not limited to, internal bleeding, infection, injury to adjacent structures discussed with patient and daughter with their understanding and consent.  Procedure scheduled for later today.    Thank you for this interesting consult.  I greatly enjoyed  meeting Meghan Macdonald and look forward to participating in their care.  A copy of this report was sent to the requesting provider on this date.  Electronically Signed: D. Rowe Robert, PA-C 08/11/2020, 1:04 PM   I spent a total of 30 minutes   in face to face in clinical consultation, greater than 50% of which was counseling/coordinating care for percutaneous transhepatic cholangiogram with biliary drain placement

## 2020-08-11 NOTE — Consult Note (Addendum)
[facility] Consult: 8:28 AM 08/11/2020  LOS: 1 day    Referring Provider: Dr. Rodena Piety  Primary Care Physician:  Robyne Peers, MD in [place].   Primary Gastroenterologist:  Dr. Gala Romney in [place]   Reason for Consultation:  Cholecystitis and Choledocholithiasis   HPI: Meghan Macdonald is a 59 y.o. female with a past medical history of SVT and gastric bypass, who presented to the ER at [facility] yesterday with a complaint of abdominal pain.    Today, the patient explains that her symptoms started about 2 weeks ago, 07/27/2020 when she started having some night sweats with a fever and would wake up "drenched", after 2 days she was very achy all over her body and by the third day she felt some better until she ate lunch and started with an epigastric/right upper quadrant pain which was an 8-9/10 and lasted for a few hours, she then noticed that she had very dark urine.  Also reports a clay colored stool around that time as well.  She presented to the urgent care and was diagnosed with a UTI and put on antibiotics.  This seemed to help things over the next week, but then on Monday, 10/4//2021 after eating breakfast she started again with pain and on Tuesday had dark urine and started with fevers again.  This worsened over the next 24 hours and pain seems to radiate through to the middle of her back, rated as a 10/10 at its worst.  Associated symptoms include nausea and slight constipation.  Patient noted that on Monday and Tuesday the pain is definitely worse within an hour or 2 after eating.  Describes a fever of 100.3 in the ER and some overnight sweating but her pain is mostly under control on medications here.    Denies vomiting, similar symptoms previously or blood in her stool.  ED course: CT abdomen  pelvis which shows features concerning for cholecystitis and possible CBD stone, LFTs elevated with total bilirubin of 7.4, AST 177, ALT 270 and alk phos 506, white count 15.7. COVID negative   GI History: 11/2016 colonoscopy, screening study.  Scattered diverticulosis sigmoid, descending, transverse.  7 mm and 4 mm polyps removed from rectum.   11/11/19 EGD. For dysphagia, anemia.   Mucosal changes were found in the esophagus. accentuated undulating Z line. No esophagitis. No stricture seen. Bilroth 2 anatomy.  Past Medical History:  Diagnosis Date  . Anxiety   . Arthritis    knees  . Asthma    exertion or exteme cold.  . Back pain   . Carcinoid tumor of appendix   . Complication of anesthesia    SOB after second surgery 4 days after gastric bypass  . Depression   . Dysrhythmia    idiopathic arthy; had plapitations since a child. On metoprolol for 9 years;  . Essential hypertension, benign   . GERD (gastroesophageal reflux disease)   . Headache   . History of kidney stones   . History of migraine headaches   . IBS (irritable bowel  syndrome)   . Insomnia   . Iron deficiency anemia   . Vitamin B deficiency     Past Surgical History:  Procedure Laterality Date  . APPENDECTOMY  2001   carcinoid tumor, found during gastric bypass.  Marland Kitchen BREAST BIOPSY Right   . COLONOSCOPY WITH ESOPHAGOGASTRODUODENOSCOPY (EGD)  2008   Dr. Collene Mares: s/p right hemicolectomy, sigmoid colon diverticulosis, otherwise unremarkable. EGD unremarkable, s/p gastric bypass, No Barrett's  . COLONOSCOPY WITH PROPOFOL N/A 11/10/2016   Procedure: COLONOSCOPY WITH PROPOFOL;  Surgeon: Daneil Dolin, MD;  Location: [facility];  Service: Endoscopy;  Laterality: N/A;  115  . CYSTOSCOPY    . ESOPHAGOGASTRODUODENOSCOPY (EGD) WITH PROPOFOL N/A 11/10/2016   Dr. Gala Romney: Because of changes found in the esophagus, as any weighted undulating Z line but no Barrett's, small bowel biopsy negative for celiac, Billroth II configuration  found. Esophagus dilated per history of dysphagia. Colonoscopy showed status post right hemicolectomy, scattered diverticula, 2 polyps removed from the rectum, one tubular adenoma. Next colonoscopy 5 years, January 2023  . GASTRIC BYPASS  2001   incidental finding of carcinoid tumor of appendix at time of surgery with appendectomy  . MALONEY DILATION N/A 11/10/2016   Procedure: Venia Minks DILATION;  Surgeon: Daneil Dolin, MD;  Location: [facility];  Service: Endoscopy;  Laterality: N/A;  . REPLACEMENT TOTAL KNEE Left 2006  . Rhinologic surgery    . RIGHT OOPHORECTOMY     removal of mesentary and part of colon and uterus with removal of appendix; 4 days after gastric bypass  . TONSILLECTOMY AND ADENOIDECTOMY      Prior to Admission medications   Medication Sig Start Date End Date Taking? Authorizing Provider  DULoxetine (CYMBALTA) 60 MG capsule Take by mouth. 05/14/17  Yes [provider]  acetaminophen (TYLENOL) 500 MG tablet Take 500 mg by mouth every 6 (six) hours as needed (for pain/headache.).    [provider]  albuterol (PROVENTIL HFA;VENTOLIN HFA) 108 (90 BASE) MCG/ACT inhaler Inhale 1-2 puffs into the lungs every 6 (six) hours as needed for wheezing or shortness of breath.    [provider]  cholecalciferol (VITAMIN D) 1000 units tablet Take 1,000 Units by mouth daily.    [provider]  Fexofenadine-Pseudoephedrine (ALLEGRA-D 24 HOUR PO) Take by mouth.    [provider]  fluticasone (FLONASE) 50 MCG/ACT nasal spray Place 1 spray into both nostrils daily as needed for allergies or rhinitis.     [provider]  metoprolol succinate (TOPROL-XL) 100 MG 24 hr tablet Take 1 tablet (100 mg total) by mouth daily. Take with or immediately following a meal. 06/16/20 09/14/20  Strader, Fransisco Hertz, PA-C  Multiple Vitamin (MULTIVITAMIN) tablet Take 1 tablet by mouth daily.    [provider]  Multiple Vitamins-Minerals (PRESERVISION  AREDS 2+MULTI VIT PO) Take by mouth 2 (two) times daily.    [provider]  naproxen sodium (ANAPROX) 220 MG tablet Take 220-440 mg by mouth 2 (two) times daily as needed (for pain/headache.).    [provider]  RABEprazole (ACIPHEX) 20 MG tablet TAKE 1 TABLET BY MOUTH ONCE DAILY BEFORE BREAKFAST 08/08/19   Mahala Menghini, PA-C  vitamin B-12 (CYANOCOBALAMIN) 50 MCG tablet Take 50 mcg by mouth daily.    [provider]  zolpidem (AMBIEN) 5 MG tablet Take 5 mg by mouth at bedtime as needed for sleep.    [provider]    Scheduled Meds: . metoprolol tartrate  5 mg Intravenous  Q6H   Infusions: . dextrose 5 % and 0.9% NaCl Stopped (08/11/20 0557)  . piperacillin-tazobactam (ZOSYN)  IV 3.375 g (08/11/20 0754)   PRN Meds: albuterol, morphine injection, ondansetron **OR** ondansetron (ZOFRAN) IV   Allergies as of 08/10/2020 - Review Complete 08/10/2020  Allergen Reaction Noted  . Azithromycin Other (See Comments) 03/10/2015    Family History  Problem Relation Age of Onset  . Asthma Mother   . Hyperlipidemia Mother   . Thyroid disease Mother   . Hyperlipidemia Father   . Lung cancer Father   . Colon cancer Maternal Grandmother 72  . Breast cancer Neg Hx     Social History   Socioeconomic History  . Marital status: Divorced    Spouse name: Not on file  . Number of children: Not on file  . Years of education: 12+  . Highest education level: Not on file  Occupational History    Employer: [employer]  Tobacco Use  . Smoking status: Never Smoker  . Smokeless tobacco: Never Used  Vaping Use  . Vaping Use: Never used  Substance and Sexual Activity  . Alcohol use: Yes    Alcohol/week: 2.0 standard drinks    Types: 2 Glasses of wine per week    Comment: Regular alcohol intake, 5 days a week. Quit December 2017  . Drug use: No  . Sexual activity: Never    Birth control/protection: None  Other Topics Concern  . Not on file    Social History Narrative  . Not on file   Social Determinants of Health   Financial Resource Strain:   . Difficulty of Paying Living Expenses: Not on file  Food Insecurity:   . Worried About Charity fundraiser in the Last Year: Not on file  . Ran Out of Food in the Last Year: Not on file  Transportation Needs:   . Lack of Transportation (Medical): Not on file  . Lack of Transportation (Non-Medical): Not on file  Physical Activity:   . Days of Exercise per Week: Not on file  . Minutes of Exercise per Session: Not on file  Stress:   . Feeling of Stress : Not on file  Social Connections:   . Frequency of Communication with Friends and Family: Not on file  . Frequency of Social Gatherings with Friends and Family: Not on file  . Attends Religious Services: Not on file  . Active Member of Clubs or Organizations: Not on file  . Attends Archivist Meetings: Not on file  . Marital Status: Not on file  Intimate Partner Violence:   . Fear of Current or Ex-Partner: Not on file  . Emotionally Abused: Not on file  . Physically Abused: Not on file  . Sexually Abused: Not on file    REVIEW OF SYSTEMS: Constitutional:  See HPI ENT:  No nose bleeds Pulm:  No SOB or cough CV:  No palpitations, no LE edema.  GU:  No hematuria, no frequency GI:  See HPI Heme:  No bleeding or bruising   Neuro:  No headaches, no peripheral tingling or numbness Derm:  No itching, no rash or sores.  Endocrine:  No polyuria or dysuria Travel:  None beyond local counties in last few months.    Physical Exam:  Vital signs in last 24 hours: Temp:  [98 F (36.7 C)-100.3 F (37.9 C)] 98 F (36.7 C) (10/06 0442) Pulse Rate:  [62-93] 75 (10/06 0442) Resp:  [15-27] 16 (10/06 0442) BP: (112-143)/(63-85)  112/70 (10/06 0442) SpO2:  [84 %-98 %] 93 % (10/06 0442) Weight:  [146.1 kg] 146.1 kg (10/05 1600) Last BM Date: 08/10/20 General:   Pleasant morbidly obese, jaundiced, Caucasian female appears to  be in NAD, Well developed, Well nourished, alert and cooperative Head:  Normocephalic and atraumatic. Eyes:   PEERL, EOMI. icteric. Conjunctiva pink. Ears:  Normal auditory acuity. Neck:  Supple Throat: Oral cavity and pharynx without inflammation, swelling or lesion.  Lungs: Respirations even and unlabored. Lungs clear to auscultation bilaterally.   No wheezes, crackles, or rhonchi.  Heart: Normal S1, S2. No MRG. Regular rate and rhythm. No peripheral edema, cyanosis or pallor.  Abdomen:  Soft, nondistended, moderate RUQ/Epigastric ttp with involuntary guarding. Normal bowel sounds. No appreciable masses or hepatomegaly. Rectal:  Not performed.  Msk:  Symmetrical without gross deformities. Peripheral pulses intact.  Extremities:  Without edema, no deformity or joint abnormality.  Neurologic:  Alert and  oriented x4;  grossly normal neurologically.  Skin:   Dry and intact without significant lesions or rashes. Psychiatric: Demonstrates good judgement and reason without abnormal affect or behaviors.   LAB RESULTS: Recent Labs    08/10/20 1608 08/11/20 0101  WBC 15.7* 27.7*  HGB 13.4 12.5  HCT 42.4 41.0  PLT 467* 400   BMET Recent Labs    08/10/20 1608 08/11/20 0101  NA 137 139  K 3.8 3.5  CL 98 103  CO2 25 23  GLUCOSE 142* 125*  BUN 10 9  CREATININE 0.42* 0.83  CALCIUM 9.0 9.1   LFT Recent Labs    08/11/20 0101  PROT 7.0  ALBUMIN 2.8*  AST 133*  ALT 224*  ALKPHOS 476*  BILITOT 8.0*  BILIDIR 5.2*  IBILI 2.8*   STUDIES: CT Abdomen Pelvis W Contrast  Result Date: 08/10/2020 CLINICAL DATA:  Epigastric abdominal pain and fever. EXAM: CT ABDOMEN AND PELVIS WITH CONTRAST TECHNIQUE: Multidetector CT imaging of the abdomen and pelvis was performed using the standard protocol following bolus administration of intravenous contrast. CONTRAST:  11m OMNIPAQUE IOHEXOL 300 MG/ML  SOLN COMPARISON:  None. FINDINGS: Lower chest: The lung bases are clear of an acute process.  Minimal streaky basilar atelectasis or scarring changes. The heart is upper limits of normal in size. No pericardial effusion. The distal esophagus is grossly normal. Hepatobiliary: No hepatic lesions are identified. Mild intrahepatic biliary dilatation is noted. The gallbladder is distended and demonstrates gallbladder wall thickening. There is also mild pericholecystic inflammatory changes. Gas containing gallstones are noted in the gallbladder. The largest measures 2.3 cm. The common bile duct is dilated to a maximum of 15 mm. Findings suspicious for a obstructing distal common bile duct stone. ERCP or MRCP may be helpful for further evaluation and treatment. Pancreas: No mass, inflammation or ductal dilatation. Prominent fatty interstices. Spleen: Normal size.  No focal lesions. Adrenals/Urinary Tract: The adrenal glands and kidneys are unremarkable. No worrisome renal lesions or obstructing ureteral calculi. The bladder is unremarkable. Stomach/Bowel: Surgical changes are noted involving the stomach from gastric bypass surgery. The duodenum, small bowel and colon are grossly normal without oral contrast. No acute inflammatory process, mass lesions or obstructive findings. Colonic diverticulosis without findings for acute diverticulitis. Vascular/Lymphatic: The aorta is normal in caliber. No dissection. The branch vessels are patent. The major venous structures are patent. No mesenteric or retroperitoneal mass or adenopathy. Small scattered lymph nodes are noted. Reproductive: The uterus and left ovary are unremarkable. The right ovary is surgically absent. Other: Periumbilical abdominal wall hernia containing  fat and a small bowel loop. No findings for incarceration or obstruction. Musculoskeletal: No significant bony findings. IMPRESSION: 1. CT findings consistent with acute calculus cholecystitis. There is also common bile duct dilatation and probable distal common bile duct stone. 2. ERCP or MRCP may be  helpful for further evaluation and treatment. 3. Periumbilical abdominal wall hernia containing fat and a small bowel loop. No findings for incarceration or obstruction. 4. Surgical changes from gastric bypass surgery. Electronically Signed   By: Marijo Sanes M.D.   On: 08/10/2020 17:37   MR ABDOMEN MRCP WO CONTRAST  Result Date: 08/11/2020 CLINICAL DATA:  Cholelithiasis with biliary dilatation. Evaluate for CBD stone. EXAM: MRI ABDOMEN WITHOUT CONTRAST  (INCLUDING MRCP) TECHNIQUE: Multiplanar multisequence MR imaging of the abdomen was performed. Heavily T2-weighted images of the biliary and pancreatic ducts were obtained, and three-dimensional MRCP images were rendered by post processing. COMPARISON:  CT scan 08/10/2020. FINDINGS: Lower chest: Dependent atelectasis bilaterally Hepatobiliary: No suspicious focal abnormality within the liver parenchyma. Mild intrahepatic biliary duct dilatation evident. Gallbladder is distended with gallbladder wall irregularity and mild pericholecystic edema. Multiple stones identified in the lumen of the gallbladder measuring in the 2.0-2.5 cm size range. Common bile duct dilated up to 13 mm diameter. 7 mm stone is identified in the distal common bile duct, at the ampulla (well demonstrated on coronal image 23 of series 5). Pancreas: No focal mass lesion. No dilatation of the main duct. No intraparenchymal cyst. No peripancreatic edema. Spleen:  No splenomegaly. No focal mass lesion. Adrenals/Urinary Tract: No adrenal nodule or mass. Kidneys unremarkable. Stomach/Bowel: Stomach is unremarkable. No gastric wall thickening. No evidence of outlet obstruction. Duodenum is normally positioned as is the ligament of Treitz. No small bowel or colonic dilatation within the visualized abdomen. Vascular/Lymphatic: No abdominal aortic aneurysm. There is no gastrohepatic or hepatoduodenal ligament lymphadenopathy. No retroperitoneal or mesenteric lymphadenopathy. Other:  No  intraperitoneal free fluid. Musculoskeletal: No suspicious marrow signal abnormality within the visualized bony anatomy. IMPRESSION: 1. 7 mm stone identified in the distal common bile duct. Associated intra and extrahepatic biliary duct dilatation with common bile duct diameter measuring up to 13 mm. 2. Cholelithiasis with gallbladder wall irregularity and mild pericholecystic edema. Acute cholecystitis not excluded. Electronically Signed   By: Misty Stanley M.D.   On: 08/11/2020 07:12   MR 3D Recon At Scanner  Result Date: 08/11/2020 CLINICAL DATA:  Cholelithiasis with biliary dilatation. Evaluate for CBD stone. EXAM: MRI ABDOMEN WITHOUT CONTRAST  (INCLUDING MRCP) TECHNIQUE: Multiplanar multisequence MR imaging of the abdomen was performed. Heavily T2-weighted images of the biliary and pancreatic ducts were obtained, and three-dimensional MRCP images were rendered by post processing. COMPARISON:  CT scan 08/10/2020. FINDINGS: Lower chest: Dependent atelectasis bilaterally Hepatobiliary: No suspicious focal abnormality within the liver parenchyma. Mild intrahepatic biliary duct dilatation evident. Gallbladder is distended with gallbladder wall irregularity and mild pericholecystic edema. Multiple stones identified in the lumen of the gallbladder measuring in the 2.0-2.5 cm size range. Common bile duct dilated up to 13 mm diameter. 7 mm stone is identified in the distal common bile duct, at the ampulla (well demonstrated on coronal image 23 of series 5). Pancreas: No focal mass lesion. No dilatation of the main duct. No intraparenchymal cyst. No peripancreatic edema. Spleen:  No splenomegaly. No focal mass lesion. Adrenals/Urinary Tract: No adrenal nodule or mass. Kidneys unremarkable. Stomach/Bowel: Stomach is unremarkable. No gastric wall thickening. No evidence of outlet obstruction. Duodenum is normally positioned as is the ligament of Treitz. No  small bowel or colonic dilatation within the visualized  abdomen. Vascular/Lymphatic: No abdominal aortic aneurysm. There is no gastrohepatic or hepatoduodenal ligament lymphadenopathy. No retroperitoneal or mesenteric lymphadenopathy. Other:  No intraperitoneal free fluid. Musculoskeletal: No suspicious marrow signal abnormality within the visualized bony anatomy. IMPRESSION: 1. 7 mm stone identified in the distal common bile duct. Associated intra and extrahepatic biliary duct dilatation with common bile duct diameter measuring up to 13 mm. 2. Cholelithiasis with gallbladder wall irregularity and mild pericholecystic edema. Acute cholecystitis not excluded. Electronically Signed   By: Misty Stanley M.D.   On: 08/11/2020 07:12    IMPRESSION:   1.  Acute cholecystitis with CBD stone: Elevated LFTs-AST 177--> 133, ALT 270--> 224, alk phos 506--> 476, total bilirubin 7.4--> 8.0; WBC count 15.7 at time of admission-->27.7 overnight, patient with history of fever and chills with right upper quadrant/epigastric pain over the last 2 weeks, history of gastric bypass as well 2.  History of paroxysmal SVT 3.  History of gastric bypass   PLAN:     1.  Dr. Rush Landmark is willing to proceed with ERCP, but only if this is done in coordination with the surgical team so that he can get access to the remnant stomach.  He does have time to do this tomorrow morning.  We are trying to discuss with surgery to see if this is something they are willing to do.  If they are not then patient may need to be transferred versus placing a percutaneous tube at first 2.  Continue supportive measures 3.  Continue antibiotics and pain medications as well as antiemetics 4.  Patient to remain n.p.o. for now 5.  Please await final recommendations from Dr. Havery Moros later today  Thank you for your kind consultation, we will continue to follow.   Ellouise Newer, PA-C 08/11/20 9:27am

## 2020-08-11 NOTE — Progress Notes (Signed)
Pharmacy Antibiotic Note  Meghan Macdonald is a 59 y.o. female admitted on 08/10/2020 with acute cholecystitis.  Pharmacy has been consulted for Zosyn dosing.  Plan: Zosyn 3.375g IV q8h (4 hour infusion).  Height: 5\' 4"  (162.6 cm) Weight: (!) 146.1 kg (322 lb) IBW/kg (Calculated) : 54.7  Temp (24hrs), Avg:99.6 F (37.6 C), Min:98.7 F (37.1 C), Max:100.3 F (37.9 C)  Recent Labs  Lab 08/10/20 1608  WBC 15.7*  CREATININE 0.42*    Estimated Creatinine Clearance: 110.5 mL/min (A) (by C-G formula based on SCr of 0.42 mg/dL (L)).    Allergies  Allergen Reactions  . Azithromycin Other (See Comments)    Upset stomach     Thank you for allowing pharmacy to be a part of this patient's care.  Wynona Neat, PharmD, BCPS  08/11/2020 12:15 AM

## 2020-08-11 NOTE — Consult Note (Addendum)
[facility] Surgery Consult Note  Anissa Abbs Jan 21, 1961  [id number].    Requesting MD: Jacki Cones Chief Complaint:  Fever at night, dark urine, RUQ pain 1.5 weeks Reason for Consult: Acute Coley cystitis with choledocholithiasis   HPI:  Patient is a 59 year old female with a history of multiple medical problems.  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  Hx anemia, hypertension, hx migraines.    She presented to the ED at [facility] with complaints of abdominal pain, fever at night, followed by abdominal pain, then dark urine, about 2 weeks ago.  She was seen in Urgent Care treated with antibiotics for a UTI.  Her symptoms improved with antibiotics but she again started to have some recurrent midepigastric abdominal pain and some nausea after eating 2 days ago.  She also reports recurrent fever fever up to 101 at home with chills.  She presented to the ED at [facility] yesterday 08/10/2020 with these complaints.  Work-up in the ED includes a temperature of 100.3.  Respiratory rate up to 25, blood pressure and O2 sat was 93 after pain medicine and she was placed on nasal cannula. CMP shows glucose of 142, creatinine 0.42, alk phos 506, lipase 23, AST 177, ALT 270, bilirubin 2.4.  WBC 15.7, H/H 13.4/42.4, platelets 467,000.  Urinalysis was negative, influenza and Covid are negative.  Blood cultures are pending.  CT scan 10/5: Findings consistent with acute cholecystitis, common bile duct dilatation with probable common bile duct stone.  There is also a periumbilical abdominal wall hernia containing fat and small bowel no findings for incarceration or obstruction. MRCP 10/6: 7 mm stone identified in the distal common bile duct associated with intra and extrahepatic duct dilatation with common bile duct up to 13 mm.  Cholelithiasis with  gallbladder wall irregularity and mild pericholecystic edema.  Patient was admitted by the hospitalist service and placed on IV Zosyn 10/5.  She has been afebrile since 2200 last p.m  WBC has gone from 15.7-27.7, alk phos 506>> 476, AST 177>> 133, ALT 270>> 244, total bilirubin 7.4>> 8.0.  We are asked to see.    ROS: Review of Systems  Constitutional: Positive for chills and fever. Negative for weight loss.  HENT: Positive for hearing loss (normal age related hearing loss).   Eyes: Negative.   Respiratory: Positive for wheezing (cough leading to bronchitis  with inhaler for PRN USE.  Not used for some time.). Negative for cough, hemoptysis, sputum production and shortness of breath.        ? Sleep apnea, but she has had one sleep study that was negative.  Cardiovascular: Positive for palpitations (Followed by cardiology in [place]). Negative for orthopnea, claudication, leg swelling and PND.  Gastrointestinal: Positive for abdominal pain (midepigastric and some RUQ pain now), constipation, diarrhea, heartburn (on chronic PPI) and nausea. Negative for blood in stool, melena and vomiting.  Genitourinary: Negative.        Yellow colored urine is better  Musculoskeletal: Positive for joint pain.  Skin:       ? yellowish  Neurological: Negative.   Endo/Heme/Allergies: Negative.   Psychiatric/Behavioral: Positive for depression. The patient is nervous/anxious.     Family History  Problem Relation Age of Onset  . Asthma Mother   . Hyperlipidemia Mother   . Thyroid disease Mother   . Hyperlipidemia Father   .  Lung cancer Father   . Colon cancer Maternal Grandmother 36  . Breast cancer Neg Hx     Past Medical History:  Diagnosis Date  . Anxiety   . Arthritis    knees  . Asthma    exertion or exteme cold.  . Back pain   . Carcinoid tumor of appendix   . Complication of anesthesia    SOB after second surgery 4 days after gastric bypass  . Depression   . Dysrhythmia     idiopathic arthy; had plapitations since a child. On metoprolol for 9 years;  . Essential hypertension, benign   . GERD (gastroesophageal reflux disease)   . Headache   . History of kidney stones   . History of migraine headaches   . IBS (irritable bowel syndrome)   . Insomnia   . Iron deficiency anemia   . Vitamin B deficiency     Past Surgical History:  Procedure Laterality Date  . APPENDECTOMY  2001   carcinoid tumor, found during gastric bypass.  Marland Kitchen BREAST BIOPSY Right   . COLONOSCOPY WITH ESOPHAGOGASTRODUODENOSCOPY (EGD)  2008   Dr. Collene Mares: s/p right hemicolectomy, sigmoid colon diverticulosis, otherwise unremarkable. EGD unremarkable, s/p gastric bypass, No Barrett's  . COLONOSCOPY WITH PROPOFOL N/A 11/10/2016   Procedure: COLONOSCOPY WITH PROPOFOL;  Surgeon: Daneil Dolin, MD;  [facility];  Service: Endoscopy;  Laterality: N/A;  115  . CYSTOSCOPY    . ESOPHAGOGASTRODUODENOSCOPY (EGD) WITH PROPOFOL N/A 11/10/2016   Dr. 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 Esophagus dilated per history of dysphagia. Colonoscopy showed status post right hemicolectomy, scattered diverticula, 2 polyps removed from the rectum, one tubular adenoma. Next colonoscopy 5 years, January 2023  . GASTRIC BYPASS  2001   incidental finding of carcinoid tumor of appendix at time of surgery with appendectomy  . MALONEY DILATION N/A 11/10/2016   Procedure: Venia Minks DILATION;  Surgeon: Daneil Dolin, MD;  Location: [facility];  Service: Endoscopy;  Laterality: N/A;  . REPLACEMENT TOTAL KNEE Left 2006  . Rhinologic surgery    . RIGHT OOPHORECTOMY     removal of mesentary and part of colon and uterus with removal of appendix; 4 days after gastric bypass  . TONSILLECTOMY AND ADENOIDECTOMY      Social History:  reports that she has never smoked. She has never used  smokeless tobacco. She reports current alcohol use of about 2.0 standard drinks of alcohol per week. She reports that she does not use drugs.  Allergies:  Allergies  Allergen Reactions  . Azithromycin Other (See Comments)    Upset stomach    Medications Prior to Admission  Medication Sig Dispense Refill  . acetaminophen (TYLENOL) 500 MG tablet Take 500 mg by mouth every 6 (six) hours as needed (for pain/headache.).    Marland Kitchen albuterol (PROVENTIL HFA;VENTOLIN HFA) 108 (90 BASE) MCG/ACT inhaler Inhale 1-2 puffs into the lungs every 6 (six) hours as needed for wheezing or shortness of breath.    . cholecalciferol (VITAMIN D) 1000 units tablet Take 1,000 Units by mouth daily.    . DULoxetine (CYMBALTA) 60 MG capsule Take by mouth.    . ferrous sulfate 325 (65 FE) MG tablet Take 325 mg by mouth daily with breakfast.    . Fexofenadine-Pseudoephedrine (ALLEGRA-D 24 HOUR PO) Take by mouth.    . fluticasone (FLONASE) 50 MCG/ACT nasal spray  Place 1 spray into both nostrils daily as needed for allergies or rhinitis.     . metoprolol succinate (TOPROL-XL) 100 MG 24 hr tablet Take 1 tablet (100 mg total) by mouth daily. Take with or immediately following a meal. 90 tablet 0  . Multiple Vitamins-Minerals (PRESERVISION AREDS 2+MULTI VIT PO) Take by mouth 2 (two) times daily.    . RABEprazole (ACIPHEX) 20 MG tablet TAKE 1 TABLET BY MOUTH ONCE DAILY BEFORE BREAKFAST 180 tablet 3  . vitamin B-12 (CYANOCOBALAMIN) 50 MCG tablet Take 50 mcg by mouth daily.    Marland Kitchen zolpidem (AMBIEN) 5 MG tablet Take 5 mg by mouth at bedtime as needed for sleep.    . cefdinir (OMNICEF) 300 MG capsule Take 300 mg by mouth 2 (two) times daily. Started 07/31/20 for 10 days    . Multiple Vitamin (MULTIVITAMIN) tablet Take 1 tablet by mouth daily.    . naproxen sodium (ANAPROX) 220 MG tablet Take 220-440 mg by mouth 2 (two) times daily as needed (for pain/headache.).      Blood pressure 112/70, pulse 75, temperature 98 F (36.7 C),  temperature source Oral, resp. rate 16, height _0  (1.626 m), weight (!) 146.1 kg, SpO2 93 %. Physical Exam:  General: pleasant, WD, WN white female who is laying in bed in NAD HEENT: head is normocephalic, atraumatic.  Sclera are slightly icteric.  Pupils are equal.  Ears and nose without any masses or lesions.  Mouth is pink and moist Heart: regular, rate, and rhythm.  Normal s1,s2. No obvious murmurs, gallops, or rubs noted.  Palpable radial and pedal pulses bilaterally Lungs: CTAB, no wheezes, rhonchi, or rales noted.  Respiratory effort nonlabored Abd: soft, mild tenderness RUQ, ND, +BS, no masses, hernias, or organomegaly.  She has a midline scar from the xyphoid to below the umbilicus MS: all 4 extremities are symmetrical with no cyanosis, clubbing, or edema. Skin: warm and dry with no masses, lesions, or rashes Neuro: Cranial nerves 2-12 grossly intact, sensation is normal throughout Psych: A&Ox3 with an appropriate affect.   Results for orders placed or performed during the hospital encounter of 08/10/20 (from the past 48 hour(s))  Lipase, blood     Status: None   Collection Time: 08/10/20  4:08 PM  Result Value Ref Range   Lipase 23 11 - 51 U/L    Comment: Performed at [facility], [address] [place], [place] [address]  Comprehensive metabolic panel     Status: Abnormal   Collection Time: 08/10/20  4:08 PM  Result Value Ref Range   Sodium 137 135 - 145 mmol/L   Potassium 3.8 3.5 - 5.1 mmol/L   Chloride 98 98 - 111 mmol/L   CO2 25 22 - 32 mmol/L   Glucose, Bld 142 (H) 70 - 99 mg/dL    Comment: Glucose reference range applies only to samples taken after fasting for at least 8 hours.   BUN 10 6 - 20 mg/dL   Creatinine, Ser 0.42 (L) 0.44 - 1.00 mg/dL   Calcium 9.0 8.9 - 10.3 mg/dL   Total Protein 7.4 6.5 - 8.1 g/dL   Albumin 3.3 (L) 3.5 - 5.0 g/dL   AST 177 (H) 15 - 41 U/L   ALT 270 (H) 0 - 44 U/L   Alkaline Phosphatase 506 (H) 38 - 126 U/L   Total  Bilirubin 7.4 (H) 0.3 - 1.2 mg/dL   GFR calc non Af Amer >60 >60 mL/min   Anion gap  14 5 - 15    Comment: Performed at [facility], [address] [place], [place] [address]  CBC     Status: Abnormal   Collection Time: 08/10/20  4:08 PM  Result Value Ref Range   WBC 15.7 (H) 4.0 - 10.5 K/uL   RBC 4.34 3.87 - 5.11 MIL/uL   Hemoglobin 13.4 12.0 - 15.0 g/dL   HCT 42.4 36 - 46 %   MCV 97.7 80.0 - 100.0 fL   MCH 30.9 26.0 - 34.0 pg   MCHC 31.6 30.0 - 36.0 g/dL   RDW 14.2 11.5 - 15.5 %   Platelets 467 (H) 150 - 400 K/uL   nRBC 0.0 0.0 - 0.2 %    Comment: Performed at [facility], [address] [place], [place] [address]  Urinalysis, Routine w reflex microscopic Urine, Clean Catch     Status: Abnormal   Collection Time: 08/10/20  4:08 PM  Result Value Ref Range   Color, Urine AMBER (A) YELLOW    Comment: BIOCHEMICALS MAY BE AFFECTED BY COLOR   APPearance CLEAR CLEAR   Specific Gravity, Urine 1.015 1.005 - 1.030   pH 7.0 5.0 - 8.0   Glucose, UA 100 (A) NEGATIVE mg/dL   Hgb urine dipstick NEGATIVE NEGATIVE   Bilirubin Urine MODERATE (A) NEGATIVE   Ketones, ur NEGATIVE NEGATIVE mg/dL   Protein, ur NEGATIVE NEGATIVE mg/dL   Nitrite NEGATIVE NEGATIVE   Leukocytes,Ua NEGATIVE NEGATIVE    Comment: Microscopic not done on urines with negative protein, blood, leukocytes, nitrite, or glucose < 500 mg/dL. Performed at [facility], [address] [place], [place] [address]   Pregnancy, urine     Status: None   Collection Time: 08/10/20  4:08 PM  Result Value Ref Range   Preg Test, Ur NEGATIVE NEGATIVE    Comment:        THE SENSITIVITY OF THIS METHODOLOGY IS >20 mIU/mL. Performed at [facility], [address] [place], [place] [address]   Culture, blood (Routine X 2) w Reflex to ID Panel     Status: None (Preliminary result)   Collection Time: 08/10/20  6:25 PM   Specimen: BLOOD  Result Value Ref Range   Specimen Description       BLOOD RIGHT ANTECUBITAL Performed at [facility], [address] [place], [place] [address]    Special Requests      BOTTLES DRAWN AEROBIC AND ANAEROBIC Blood Culture adequate volume Performed at [facility], [address] [place], [place] [address]    Culture      NO GROWTH < 12 HOURS Performed at [facility], [place] [address], [place], [place] [address]    Report Status PENDING   Culture, blood (Routine X 2) w Reflex to ID Panel     Status: None (Preliminary result)   Collection Time: 08/10/20  6:45 PM   Specimen: BLOOD  Result Value Ref Range   Specimen Description      BLOOD BLOOD RIGHT WRIST Performed at [facility], [address] [place], [place] [address]    Special Requests      BOTTLES DRAWN AEROBIC AND ANAEROBIC Blood Culture results may not be optimal due to an inadequate volume of blood received in culture bottles Performed at [facility] [address] [place], [place] [address]    Culture  NO GROWTH < 12 HOURS Performed at [facility] [address], [place], [place] [address]    Report Status PENDING   Respiratory Panel by RT PCR (Flu A&B, Covid) - Nasopharyngeal Swab     Status: None   Collection Time: 08/10/20  7:52 PM   Specimen: Nasopharyngeal Swab  Result Value Ref Range   SARS Coronavirus 2 by RT PCR NEGATIVE NEGATIVE    Comment: (NOTE) SARS-CoV-2 target nucleic acids are NOT DETECTED.  The SARS-CoV-2 RNA is generally detectable in upper respiratoy specimens during the acute phase of infection. The lowest concentration of SARS-CoV-2 viral copies this assay can detect is 131 copies/mL. A negative result does not preclude SARS-Cov-2 infection and should not be used as the sole basis for treatment or other patient management decisions. A negative result may occur with  improper specimen collection/handling, submission of specimen other than nasopharyngeal swab, presence of viral  mutation(s) within the areas targeted by this assay, and inadequate number of viral copies (<131 copies/mL). A negative result must be combined with clinical observations, patient history, and epidemiological information. The expected result is Negative.  Fact Sheet for Patients:  PinkCheek.be  Fact Sheet for Healthcare Providers:  GravelBags.it  This test is no t yet approved or cleared by the [place] FDA and  has been authorized for detection and/or diagnosis of SARS-CoV-2 by FDA under an Emergency Use Authorization (EUA). This EUA will remain  in effect (meaning this test can be used) for the duration of the COVID-19 declaration under Section 564(b)(1) of the Act, 21 U.S.C. section 360bbb-3(b)(1), unless the authorization is terminated or revoked sooner.     Influenza A by PCR NEGATIVE NEGATIVE   Influenza B by PCR NEGATIVE NEGATIVE    Comment: (NOTE) The Xpert Xpress SARS-CoV-2/FLU/RSV assay is intended as an aid in  the diagnosis of influenza from Nasopharyngeal swab specimens and  should not be used as a sole basis for treatment. Nasal washings and  aspirates are unacceptable for Xpert Xpress SARS-CoV-2/FLU/RSV  testing.  Fact Sheet for Patients: PinkCheek.be  Fact Sheet for Healthcare Providers: GravelBags.it  This test is not yet approved or cleared by the [place] FDA and  has been authorized for detection and/or diagnosis of SARS-CoV-2 by  FDA under an Emergency Use Authorization (EUA). This EUA will remain  in effect (meaning this test can be used) for the duration of the  Covid-19 declaration under Section 564(b)(1) of the Act, 21  U.S.C. section 360bbb-3(b)(1), unless the authorization is  terminated or revoked. Performed at [facility], [address] [place], [place] [address]   Glucose, capillary     Status: Abnormal    Collection Time: 08/11/20 12:35 AM  Result Value Ref Range   Glucose-Capillary 135 (H) 70 - 99 mg/dL    Comment: Glucose reference range applies only to samples taken after fasting for at least 8 hours.  Basic metabolic panel     Status: Abnormal   Collection Time: 08/11/20  1:01 AM  Result Value Ref Range   Sodium 139 135 - 145 mmol/L   Potassium 3.5 3.5 - 5.1 mmol/L   Chloride 103 98 - 111 mmol/L   CO2 23 22 - 32 mmol/L   Glucose, Bld 125 (H) 70 - 99 mg/dL    Comment: Glucose reference range applies only to samples taken after fasting for at least 8 hours.   BUN 9 6 - 20 mg/dL   Creatinine, Ser 0.83 0.44 -  1.00 mg/dL   Calcium 9.1 8.9 - 10.3 mg/dL   GFR calc non Af Amer >60 >60 mL/min   Anion gap 13 5 - 15    Comment: Performed at [facility] [address], [place], [place] [address]  CBC     Status: Abnormal   Collection Time: 08/11/20  1:01 AM  Result Value Ref Range   WBC 27.7 (H) 4.0 - 10.5 K/uL   RBC 4.19 3.87 - 5.11 MIL/uL   Hemoglobin 12.5 12.0 - 15.0 g/dL   HCT 41.0 36 - 46 %   MCV 97.9 80.0 - 100.0 fL   MCH 29.8 26.0 - 34.0 pg   MCHC 30.5 30.0 - 36.0 g/dL   RDW 14.5 11.5 - 15.5 %   Platelets 400 150 - 400 K/uL   nRBC 0.0 0.0 - 0.2 %    Comment: Performed at [facility], [place] [address] [place], [place] [address]  Hepatic function panel     Status: Abnormal   Collection Time: 08/11/20  1:01 AM  Result Value Ref Range   Total Protein 7.0 6.5 - 8.1 g/dL   Albumin 2.8 (L) 3.5 - 5.0 g/dL   AST 133 (H) 15 - 41 U/L   ALT 224 (H) 0 - 44 U/L   Alkaline Phosphatase 476 (H) 38 - 126 U/L   Total Bilirubin 8.0 (H) 0.3 - 1.2 mg/dL   Bilirubin, Direct 5.2 (H) 0.0 - 0.2 mg/dL   Indirect Bilirubin 2.8 (H) 0.3 - 0.9 mg/dL    Comment: Performed at [facility] [address] [place], [place] [address]  Glucose, capillary     Status: Abnormal   Collection Time: 08/11/20  8:13 AM  Result Value Ref Range   Glucose-Capillary 132 (H) 70 - 99 mg/dL     Comment: Glucose reference range applies only to samples taken after fasting for at least 8 hours.   CT Abdomen Pelvis W Contrast  Result Date: 08/10/2020 CLINICAL DATA:  Epigastric abdominal pain and fever. EXAM: CT ABDOMEN AND PELVIS WITH CONTRAST TECHNIQUE: Multidetector CT imaging of the abdomen and pelvis was performed using the standard protocol following bolus administration of intravenous contrast. CONTRAST:  174m OMNIPAQUE IOHEXOL 300 MG/ML  SOLN COMPARISON:  None. FINDINGS: Lower chest: The lung bases are clear of an acute process. Minimal streaky basilar atelectasis or scarring changes. The heart is upper limits of normal in size. No pericardial effusion. The distal esophagus is grossly normal. Hepatobiliary: No hepatic lesions are identified. Mild intrahepatic biliary dilatation is noted. The gallbladder is distended and demonstrates gallbladder wall thickening. There is also mild pericholecystic inflammatory changes. Gas containing gallstones are noted in the gallbladder. The largest measures 2.3 cm. The common bile duct is dilated to a maximum of 15 mm. Findings suspicious for a obstructing distal common bile duct stone. ERCP or MRCP may be helpful for further evaluation and treatment. Pancreas: No mass, inflammation or ductal dilatation. Prominent fatty interstices. Spleen: Normal size.  No focal lesions. Adrenals/Urinary Tract: The adrenal glands and kidneys are unremarkable. No worrisome renal lesions or obstructing ureteral calculi. The bladder is unremarkable. Stomach/Bowel: Surgical changes are noted involving the stomach from gastric bypass surgery. The duodenum, small bowel and colon are grossly normal without oral contrast. No acute inflammatory process, mass lesions or obstructive findings. Colonic diverticulosis without findings for acute diverticulitis. Vascular/Lymphatic: The aorta is normal in caliber. No dissection. The branch vessels are patent. The major venous structures are  patent.  No mesenteric or retroperitoneal mass or adenopathy. Small scattered lymph nodes are noted. Reproductive: The uterus and left ovary are unremarkable. The right ovary is surgically absent. Other: Periumbilical abdominal wall hernia containing fat and a small bowel loop. No findings for incarceration or obstruction. Musculoskeletal: No significant bony findings. IMPRESSION: 1. CT findings consistent with acute calculus cholecystitis. There is also common bile duct dilatation and probable distal common bile duct stone. 2. ERCP or MRCP may be helpful for further evaluation and treatment. 3. Periumbilical abdominal wall hernia containing fat and a small bowel loop. No findings for incarceration or obstruction. 4. Surgical changes from gastric bypass surgery. Electronically Signed   By: Marijo Sanes M.D.   On: 08/10/2020 17:37   MR ABDOMEN MRCP WO CONTRAST  Result Date: 08/11/2020 CLINICAL DATA:  Cholelithiasis with biliary dilatation. Evaluate for CBD stone. EXAM: MRI ABDOMEN WITHOUT CONTRAST  (INCLUDING MRCP) TECHNIQUE: Multiplanar multisequence MR imaging of the abdomen was performed. Heavily T2-weighted images of the biliary and pancreatic ducts were obtained, and three-dimensional MRCP images were rendered by post processing. COMPARISON:  CT scan 08/10/2020. FINDINGS: Lower chest: Dependent atelectasis bilaterally Hepatobiliary: No suspicious focal abnormality within the liver parenchyma. Mild intrahepatic biliary duct dilatation evident. Gallbladder is distended with gallbladder wall irregularity and mild pericholecystic edema. Multiple stones identified in the lumen of the gallbladder measuring in the 2.0-2.5 cm size range. Common bile duct dilated up to 13 mm diameter. 7 mm stone is identified in the distal common bile duct, at the ampulla (well demonstrated on coronal image 23 of series 5). Pancreas: No focal mass lesion. No dilatation of the main duct. No intraparenchymal cyst. No peripancreatic  edema. Spleen:  No splenomegaly. No focal mass lesion. Adrenals/Urinary Tract: No adrenal nodule or mass. Kidneys unremarkable. Stomach/Bowel: Stomach is unremarkable. No gastric wall thickening. No evidence of outlet obstruction. Duodenum is normally positioned as is the ligament of Treitz. No small bowel or colonic dilatation within the visualized abdomen. Vascular/Lymphatic: No abdominal aortic aneurysm. There is no gastrohepatic or hepatoduodenal ligament lymphadenopathy. No retroperitoneal or mesenteric lymphadenopathy. Other:  No intraperitoneal free fluid. Musculoskeletal: No suspicious marrow signal abnormality within the visualized bony anatomy. IMPRESSION: 1. 7 mm stone identified in the distal common bile duct. Associated intra and extrahepatic biliary duct dilatation with common bile duct diameter measuring up to 13 mm. 2. Cholelithiasis with gallbladder wall irregularity and mild pericholecystic edema. Acute cholecystitis not excluded. Electronically Signed   By: Misty Stanley M.D.   On: 08/11/2020 07:12   MR 3D Recon At Scanner  Result Date: 08/11/2020 CLINICAL DATA:  Cholelithiasis with biliary dilatation. Evaluate for CBD stone. EXAM: MRI ABDOMEN WITHOUT CONTRAST  (INCLUDING MRCP) TECHNIQUE: Multiplanar multisequence MR imaging of the abdomen was performed. Heavily T2-weighted images of the biliary and pancreatic ducts were obtained, and three-dimensional MRCP images were rendered by post processing. COMPARISON:  CT scan 08/10/2020. FINDINGS: Lower chest: Dependent atelectasis bilaterally Hepatobiliary: No suspicious focal abnormality within the liver parenchyma. Mild intrahepatic biliary duct dilatation evident. Gallbladder is distended with gallbladder wall irregularity and mild pericholecystic edema. Multiple stones identified in the lumen of the gallbladder measuring in the 2.0-2.5 cm size range. Common bile duct dilated up to 13 mm diameter. 7 mm stone is identified in the distal common bile  duct, at the ampulla (well demonstrated on coronal image 23 of series 5). Pancreas: No focal mass lesion. No dilatation of the main duct. No intraparenchymal cyst. No peripancreatic edema. Spleen:  No splenomegaly. No focal mass lesion.  Adrenals/Urinary Tract: No adrenal nodule or mass. Kidneys unremarkable. Stomach/Bowel: Stomach is unremarkable. No gastric wall thickening. No evidence of outlet obstruction. Duodenum is normally positioned as is the ligament of Treitz. No small bowel or colonic dilatation within the visualized abdomen. Vascular/Lymphatic: No abdominal aortic aneurysm. There is no gastrohepatic or hepatoduodenal ligament lymphadenopathy. No retroperitoneal or mesenteric lymphadenopathy. Other:  No intraperitoneal free fluid. Musculoskeletal: No suspicious marrow signal abnormality within the visualized bony anatomy. IMPRESSION: 1. 7 mm stone identified in the distal common bile duct. Associated intra and extrahepatic biliary duct dilatation with common bile duct diameter measuring up to 13 mm. 2. Cholelithiasis with gallbladder wall irregularity and mild pericholecystic edema. Acute cholecystitis not excluded. Electronically Signed   By: Misty Stanley M.D.   On: 08/11/2020 07:12   . dextrose 5 % and 0.9% NaCl Stopped (08/11/20 0557)  . 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 Grandville Silos has reviewed with DR. Mansouraty and Dr. Havery Moros, from [place].  We have not done a combined procedure which includes surgical  access to the gastric remnant, ERCP, and cholecystectomy here in [place].  Dr. Biagio Borg recommendation the patient be transferred to a tertiary center where they do this procedure.  I have discussed and made this recommendation to Dr. Zigmund Daniel, Hospitalist attending her currently.  Agree with ongoing antibiotics.        Earnstine Regal [facility] Surgery 08/11/2020, 9:06 AM Please see Amion for pager number during day hours 7:00am-4:30pm

## 2020-08-12 ENCOUNTER — Inpatient Hospital Stay (HOSPITAL_COMMUNITY): Payer: Managed Care, Other (non HMO) | Admitting: Certified Registered Nurse Anesthetist

## 2020-08-12 ENCOUNTER — Inpatient Hospital Stay (HOSPITAL_COMMUNITY): Payer: Managed Care, Other (non HMO)

## 2020-08-12 ENCOUNTER — Encounter (HOSPITAL_COMMUNITY)
Admission: EM | Disposition: A | Discharge status: Other Acute Inpatient Hospital | Payer: Self-pay | Source: Home / Self Care | Attending: Internal Medicine

## 2020-08-12 DIAGNOSIS — K8030 Calculus of bile duct with cholangitis, unspecified, without obstruction: Secondary | ICD-10-CM

## 2020-08-12 DIAGNOSIS — K8309 Other cholangitis: Secondary | ICD-10-CM

## 2020-08-12 HISTORY — PX: BIOPSY: SHX5522

## 2020-08-12 HISTORY — PX: ERCP: SHX60

## 2020-08-12 HISTORY — PX: SUBMUCOSAL TATTOO INJECTION: SHX6856

## 2020-08-12 HISTORY — PX: ENTEROSCOPY: SHX5533

## 2020-08-12 HISTORY — PX: ERCP: SHX5425

## 2020-08-12 LAB — CBC
HCT: 38.7 % (ref 36.0–46.0)
Hemoglobin: 11.7 g/dL — ABNORMAL LOW (ref 12.0–15.0)
MCH: 29.7 pg (ref 26.0–34.0)
MCHC: 30.2 g/dL (ref 30.0–36.0)
MCV: 98.2 fL (ref 80.0–100.0)
Platelets: 327 10*3/uL (ref 150–400)
RBC: 3.94 MIL/uL (ref 3.87–5.11)
RDW: 14.6 % (ref 11.5–15.5)
WBC: 14.3 10*3/uL — ABNORMAL HIGH (ref 4.0–10.5)
nRBC: 0 % (ref 0.0–0.2)

## 2020-08-12 LAB — COMPREHENSIVE METABOLIC PANEL
ALT: 152 U/L — ABNORMAL HIGH (ref 0–44)
AST: 68 U/L — ABNORMAL HIGH (ref 15–41)
Albumin: 2.4 g/dL — ABNORMAL LOW (ref 3.5–5.0)
Alkaline Phosphatase: 401 U/L — ABNORMAL HIGH (ref 38–126)
Anion gap: 11 (ref 5–15)
BUN: 8 mg/dL (ref 6–20)
CO2: 24 mmol/L (ref 22–32)
Calcium: 8.7 mg/dL — ABNORMAL LOW (ref 8.9–10.3)
Chloride: 103 mmol/L (ref 98–111)
Creatinine, Ser: 0.68 mg/dL (ref 0.44–1.00)
GFR calc non Af Amer: >60 mL/min (ref >60)
Glucose, Bld: 131 mg/dL — ABNORMAL HIGH (ref 70–99)
Potassium: 3.3 mmol/L — ABNORMAL LOW (ref 3.5–5.1)
Sodium: 138 mmol/L (ref 135–145)
Total Bilirubin: 7.6 mg/dL — ABNORMAL HIGH (ref 0.3–1.2)
Total Protein: 6 g/dL — ABNORMAL LOW (ref 6.5–8.1)

## 2020-08-12 LAB — GLUCOSE, CAPILLARY
Glucose-Capillary: 109 mg/dL — ABNORMAL HIGH (ref 70–99)
Glucose-Capillary: 137 mg/dL — ABNORMAL HIGH (ref 70–99)
Glucose-Capillary: 152 mg/dL — ABNORMAL HIGH (ref 70–99)

## 2020-08-12 SURGERY — ERCP, WITH INTERVENTION IF INDICATED
Anesthesia: General

## 2020-08-12 MED ORDER — DEXAMETHASONE SODIUM PHOSPHATE 10 MG/ML IJ SOLN
INTRAMUSCULAR | Status: DC | PRN
  Administered 2020-08-12: 4 mg via INTRAVENOUS

## 2020-08-12 MED ORDER — METOPROLOL SUCCINATE ER 100 MG PO TB24
100.0000 mg | ORAL_TABLET | Freq: Every day | ORAL | Status: DC
  Administered 2020-08-12 – 2020-08-13 (×2): 100 mg via ORAL
  Filled 2020-08-12 (×2): qty 1

## 2020-08-12 MED ORDER — INDOMETHACIN 50 MG RE SUPP
RECTAL | Status: AC
  Filled 2020-08-12: qty 2

## 2020-08-12 MED ORDER — PHENYLEPHRINE HCL-NACL 20-0.9 MG/250ML-% IV SOLN
INTRAVENOUS | Status: DC | PRN
  Administered 2020-08-12: 25 ug/min via INTRAVENOUS

## 2020-08-12 MED ORDER — ONDANSETRON HCL 4 MG/2ML IJ SOLN
INTRAMUSCULAR | Status: DC | PRN
  Administered 2020-08-12: 4 mg via INTRAVENOUS

## 2020-08-12 MED ORDER — GLUCAGON HCL RDNA (DIAGNOSTIC) 1 MG IJ SOLR
INTRAMUSCULAR | Status: AC
  Filled 2020-08-12: qty 2

## 2020-08-12 MED ORDER — SODIUM CHLORIDE 0.9% FLUSH
5.0000 mL | Freq: Three times a day (TID) | INTRAVENOUS | Status: DC
  Administered 2020-08-12 – 2020-08-13 (×2): 5 mL

## 2020-08-12 MED ORDER — SPOT INK MARKER SYRINGE KIT
PACK | SUBMUCOSAL | Status: DC | PRN
  Administered 2020-08-12: 1 mL via SUBMUCOSAL

## 2020-08-12 MED ORDER — LACTATED RINGERS IV SOLN: Freq: Once | INTRAVENOUS | Status: AC

## 2020-08-12 MED ORDER — FENTANYL CITRATE (PF) 100 MCG/2ML IJ SOLN
INTRAMUSCULAR | Status: AC
  Filled 2020-08-12: qty 2

## 2020-08-12 MED ORDER — EPHEDRINE SULFATE-NACL 50-0.9 MG/10ML-% IV SOSY
PREFILLED_SYRINGE | INTRAVENOUS | Status: DC | PRN
  Administered 2020-08-12 (×3): 10 mg via INTRAVENOUS

## 2020-08-12 MED ORDER — MIDAZOLAM HCL 5 MG/5ML IJ SOLN
INTRAMUSCULAR | Status: DC | PRN
  Administered 2020-08-12: 2 mg via INTRAVENOUS

## 2020-08-12 MED ORDER — PROPOFOL 10 MG/ML IV BOLUS
INTRAVENOUS | Status: DC | PRN
  Administered 2020-08-12: 150 mg via INTRAVENOUS

## 2020-08-12 MED ORDER — MIDAZOLAM HCL 2 MG/2ML IJ SOLN
INTRAMUSCULAR | Status: AC
  Filled 2020-08-12: qty 2

## 2020-08-12 MED ORDER — PHENYLEPHRINE 40 MCG/ML (10ML) SYRINGE FOR IV PUSH (FOR BLOOD PRESSURE SUPPORT)
PREFILLED_SYRINGE | INTRAVENOUS | Status: DC | PRN
  Administered 2020-08-12: 80 ug via INTRAVENOUS
  Administered 2020-08-12: 200 ug via INTRAVENOUS
  Administered 2020-08-12: 120 ug via INTRAVENOUS

## 2020-08-12 MED ORDER — FENTANYL CITRATE (PF) 100 MCG/2ML IJ SOLN
INTRAMUSCULAR | Status: DC | PRN
  Administered 2020-08-12: 100 ug via INTRAVENOUS

## 2020-08-12 MED ORDER — SUCCINYLCHOLINE CHLORIDE 200 MG/10ML IV SOSY
PREFILLED_SYRINGE | INTRAVENOUS | Status: DC | PRN
  Administered 2020-08-12: 120 mg via INTRAVENOUS

## 2020-08-12 MED ORDER — INDOMETHACIN 50 MG RE SUPP
RECTAL | Status: DC | PRN
  Administered 2020-08-12: 100 mg via RECTAL

## 2020-08-12 MED ORDER — LACTATED RINGERS IV SOLN: INTRAVENOUS | Status: DC | PRN

## 2020-08-12 MED ORDER — GLUCAGON HCL RDNA (DIAGNOSTIC) 1 MG IJ SOLR
INTRAMUSCULAR | Status: DC | PRN
  Administered 2020-08-12 (×3): .25 mg via INTRAVENOUS

## 2020-08-12 MED ORDER — ALBUMIN HUMAN 5 % IV SOLN: INTRAVENOUS | Status: DC | PRN

## 2020-08-12 MED ORDER — LIDOCAINE 2% (20 MG/ML) 5 ML SYRINGE
INTRAMUSCULAR | Status: DC | PRN
  Administered 2020-08-12: 80 mg via INTRAVENOUS

## 2020-08-12 NOTE — Anesthesia Procedure Notes (Signed)
Procedure Name: Intubation Date/Time: 08/12/2020 9:41 AM Performed by: Lowella Dell, CRNA Pre-anesthesia Checklist: Patient identified, Emergency Drugs available, Suction available and Patient being monitored Patient Re-evaluated:Patient Re-evaluated prior to induction Oxygen Delivery Method: Circle System Utilized Preoxygenation: Pre-oxygenation with 100% oxygen Induction Type: IV induction Ventilation: Mask ventilation without difficulty Laryngoscope Size: Mac and 4 Grade View: Grade I Tube type: Oral Tube size: 7.0 mm Number of attempts: 1 Airway Equipment and Method: Stylet and Oral airway Placement Confirmation: ETT inserted through vocal cords under direct vision,  positive ETCO2 and breath sounds checked- equal and bilateral Secured at: 22 cm Tube secured with: Tape Dental Injury: Teeth and Oropharynx as per pre-operative assessment

## 2020-08-12 NOTE — Transfer of Care (Signed)
Immediate Anesthesia Transfer of Care Note  Patient: Meghan Macdonald  Procedure(s) Performed: ENDOSCOPIC RETROGRADE CHOLANGIOPANCREATOGRAPHY (ERCP) (N/A ) ENTEROSCOPY (N/A ) BIOPSY  Patient Location: PACU and Endoscopy Unit  Anesthesia Type:General  Level of Consciousness: awake, alert , oriented and patient cooperative  Airway & Oxygen Therapy: Patient Spontanous Breathing and Patient connected to nasal cannula oxygen  Post-op Assessment: Report given to RN and Post -op Vital signs reviewed and stable  Post vital signs: Reviewed and stable  Last Vitals:  Vitals Value Taken Time  BP    Temp    Pulse 76 08/12/20 1248  Resp 15 08/12/20 1248  SpO2 96 % 08/12/20 1248  Vitals shown include unvalidated device data.  Last Pain:  Vitals:   08/12/20 1243  TempSrc: (P) Axillary  PainSc:       Patients Stated Pain Goal: 0 (83/35/82 5189)  Complications: No complications documented.

## 2020-08-12 NOTE — Progress Notes (Signed)
Day of Surgery    LH:[id number] pain  Subjective: OK after ERCP, comfortable, she is aware he was unable to do the ERCP.  Objective: Vital signs in last 24 hours: Temp:  [97.7 F (36.5 C)-98.1 F (36.7 C)] 97.7 F (36.5 C) (10/07 0826) Pulse Rate:  [75-84] 75 (10/07 0910) Resp:  [15-20] 20 (10/07 0910) BP: (114-165)/(61-93) 135/61 (10/07 0910) SpO2:  [92 %-97 %] 93 % (10/07 0910) Last BM Date: 08/10/20 NPO IV 256 recorded Urine x 2 Drain 40 recorded Afebrile, VSS   Intake/Output from previous day: 10/06 0701 - 10/07 0700 In: 56.1 [I.V.:56.1] Out: 40 [Drains:40] Intake/Output this shift: Total I/O In: 250 [IV Piggyback:250] Out: -   General appearance: alert, cooperative and no distress GI: soft sore, OK after ERCP  Lab Results:  Recent Labs    08/11/20 0101 08/12/20 0036  WBC 27.7* 14.3*  HGB 12.5 11.7*  HCT 41.0 38.7  PLT 400 327    BMET Recent Labs    08/11/20 0101 08/12/20 0036  NA 139 138  K 3.5 3.3*  CL 103 103  CO2 23 24  GLUCOSE 125* 131*  BUN 9 8  CREATININE 0.83 0.68  CALCIUM 9.1 8.7*   PT/INR Recent Labs    08/11/20 1222  LABPROT 15.3*  INR 1.3*    Recent Labs  Lab 08/10/20 1608 08/11/20 0101 08/12/20 0036  AST 177* 133* 68*  ALT 270* 224* 152*  ALKPHOS 506* 476* 401*  BILITOT 7.4* 8.0* 7.6*  PROT 7.4 7.0 6.0*  ALBUMIN 3.3* 2.8* 2.4*     Lipase     Component Value Date/Time   LIPASE 23 08/10/2020 1608     Medications: . [MAR Hold] metoprolol tartrate  5 mg Intravenous Q6H  . 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 Rush Landmark  - WBC 15.5>>27.7>>14.3  FEN: IV fluids/NPO ID: Zosyn 10/5>> day 3 DVT:  SCD's Follow up:  TBD  Plan:  Gi is recommending transfer to [facility] center.        LOS: 2 days    Jocsan Mcginley 08/12/2020 Please see Amion

## 2020-08-12 NOTE — Progress Notes (Signed)
PROGRESS NOTE    Meghan Macdonald  AYT:[id number] DOB: Aug 17, 1961 DOA: 08/10/2020 PCP: Robyne Peers, MD  Brief Narrative:  59 y.o. female with history of gastric bypass and paroxysmal supraventricular tachycardia on metoprolol presents to the ER at [facility] with complaints of abdominal pain.  Patient states her symptoms started about 2 weeks ago when patient had abdominal discomfort and urine was concentrated.  Patient had gone to the urgent care and was given antibiotics for urine tract infection.  Symptoms initially improved but over the last 2 days patient started getting abdominal pain mostly in the right upper quadrant particularly when eating.  Some nausea.  Denies any diarrhea.  Patient also start developing fever chills and at this time was concerned about the symptoms presents to the ER.  ED Course: In the ER patient had CT abdomen pelvis which shows features concerning for cholecystitis and also possible CBD stone.  LFTs were elevated with total bilirubin of 7.4 AST of 177 ALT of 270 alkaline phosphatase of 506.  WBC count was 15.7.  On-call general surgery was consulted patient started on empiric antibiotics fluids and admitted for further management.  Covid test was negative.  Assessment & Plan:   Active Problems:   Essential hypertension, benign   Acute cholecystitis   Choledocholithiasis   History of gastric bypass   #1 acute cholecystitis with CBD stone-patient is status post percutaneous cystostomy tube placement by interventional radiology 08/11/2020.  It is draining blood-tinged drainage.  ERCP attempted today without success. Called [facility] at [phone] talked with the hospitalist Dr. Illene Bolus who will get back with me after speaking with the GI team at [facility].  They would like to send the patient back to [place] after surgery. Patient has a very altered anatomy with previous multiple abdominal surgeries including ileocecectomy exploratory laparotomy right ovarian  resection mesenteric resection for appendiceal carcinoid in 2001.  She has also had Rouxin Y gastric bypass.   Continue Zosyn. LFTs are trending down.  White count trended down to 14 from 26. #2 paroxysmal SVT restart home meds continue Lopressor as needed.  Restart metoprolol.  Estimated body mass index is 55.27 kg/m as calculated from the following:   Height as of this encounter: 5\' 4"  (1.626 m).   Weight as of this encounter: 146.1 kg.  DVT prophylaxis: She is not on anything for DVT prophylaxis as she is still having procedures done.   Code Status: Full code  family Communication: None at bedside  disposition Plan:  Status is: Inpatient.  We are hoping this patient can be transferred to [facility] for further care.  Waiting to hear back from [facility].  Dispo: The patient is from: Home              Anticipated d/c is to: Home              Anticipated d/c date is: > 3 days              Patient currently is not medically stable to d/c.    Consultants:   GI and general surgery and interventional radiology  Procedures: Percutaneous cholecystostomy tube placed 08/11/2020 by interventional radiology. ERCP attempted today 08/12/2020 Antimicrobials: Zosyn  Subjective: Patient resting in bed she is awake and alert complaining of abdominal pain  Objective: Vitals:   08/12/20 1255 08/12/20 1300 08/12/20 1310 08/12/20 1316  BP: (!) 134/56  129/66 136/63  Pulse: 72 76 76 74  Resp: (!) 21 17 16 15   Temp:  TempSrc:      SpO2: 96% 91% 91% 92%  Weight:      Height:        Intake/Output Summary (Last 24 hours) at 08/12/2020 1439 Last data filed at 08/12/2020 1221 Gross per 24 hour  Intake 1550 ml  Output 40 ml  Net 1510 ml   Filed Weights   08/10/20 1600  Weight: (!) 146.1 kg    Examination:  General exam: Appears calm and comfortable  Respiratory system: Clear to auscultation. Respiratory effort normal. Cardiovascular system: S1 & S2 heard, RRR. No JVD, murmurs, rubs, gallops or  clicks. No pedal edema. Gastrointestinal system: Abdomen is nondistended, soft and tender. No organomegaly or masses felt. Normal bowel sounds heard. Central nervous system: Alert and oriented. No focal neurological deficits. Extremities: Symmetric 5 x 5 power. Skin: No rashes, lesions or ulcers Psychiatry: Judgement and insight appear normal. Mood & affect appropriate.     Data Reviewed: I have personally reviewed following labs and imaging studies  CBC: Recent Labs  Lab 08/10/20 1608 08/11/20 0101 08/12/20 0036  WBC 15.7* 27.7* 14.3*  HGB 13.4 12.5 11.7*  HCT 42.4 41.0 38.7  MCV 97.7 97.9 98.2  PLT 467* 400 595   Basic Metabolic Panel: Recent Labs  Lab 08/10/20 1608 08/11/20 0101 08/12/20 0036  NA 137 139 138  K 3.8 3.5 3.3*  CL 98 103 103  CO2 25 23 24   GLUCOSE 142* 125* 131*  BUN 10 9 8   CREATININE 0.42* 0.83 0.68  CALCIUM 9.0 9.1 8.7*   GFR: Estimated Creatinine Clearance: 110.5 mL/min (by C-G formula based on SCr of 0.68 mg/dL). Liver Function Tests: Recent Labs  Lab 08/10/20 1608 08/11/20 0101 08/12/20 0036  AST 177* 133* 68*  ALT 270* 224* 152*  ALKPHOS 506* 476* 401*  BILITOT 7.4* 8.0* 7.6*  PROT 7.4 7.0 6.0*  ALBUMIN 3.3* 2.8* 2.4*   Recent Labs  Lab 08/10/20 1608  LIPASE 23   No results for input(s): AMMONIA in the last 168 hours. Coagulation Profile: Recent Labs  Lab 08/11/20 1222  INR 1.3*   Cardiac Enzymes: No results for input(s): CKTOTAL, CKMB, CKMBINDEX, TROPONINI in the last 168 hours. BNP (last 3 results) No results for input(s): PROBNP in the last 8760 hours. HbA1C: No results for input(s): HGBA1C in the last 72 hours. CBG: Recent Labs  Lab 08/11/20 0035 08/11/20 0813 08/11/20 1707 08/12/20 0025 08/12/20 0807  GLUCAP 135* 132* 105* 137* 109*   Lipid Profile: No results for input(s): CHOL, HDL, LDLCALC, TRIG, CHOLHDL, LDLDIRECT in the last 72 hours. Thyroid Function Tests: No results for input(s): TSH, T4TOTAL,  FREET4, T3FREE, THYROIDAB in the last 72 hours. Anemia Panel: No results for input(s): VITAMINB12, FOLATE, FERRITIN, TIBC, IRON, RETICCTPCT in the last 72 hours. Sepsis Labs: No results for input(s): PROCALCITON, LATICACIDVEN in the last 168 hours.  Recent Results (from the past 240 hour(s))  Culture, blood (Routine X 2) w Reflex to ID Panel     Status: Abnormal (Preliminary result)   Collection Time: 08/10/20  6:25 PM   Specimen: BLOOD  Result Value Ref Range Status   Specimen Description   Final    BLOOD RIGHT ANTECUBITAL Performed at [facility], [address] [place], [place] [address]    Special Requests   Final    BOTTLES DRAWN AEROBIC AND ANAEROBIC Blood Culture adequate volume Performed at [facility], [address], [place], [place] [address]    Culture  Setup  Time   Final    GRAM POSITIVE COCCI IN BOTH AEROBIC AND ANAEROBIC BOTTLES CRITICAL RESULT CALLED TO, READ BACK BY AND VERIFIED WITH: Tillman Sers [facility] 08/11/20 2120 JDW    Culture (A)  Final    STAPHYLOCOCCUS EPIDERMIDIS THE SIGNIFICANCE OF ISOLATING THIS ORGANISM FROM A SINGLE SET OF BLOOD CULTURES WHEN MULTIPLE SETS ARE DRAWN IS UNCERTAIN. PLEASE NOTIFY THE MICROBIOLOGY DEPARTMENT WITHIN ONE WEEK IF SPECIATION AND SENSITIVITIES ARE REQUIRED. Performed at [facility], [place] [address], [place], [place] [address]    Report Status PENDING  Incomplete  Blood Culture ID Panel (Reflexed)     Status: Abnormal   Collection Time: 08/10/20  6:25 PM  Result Value Ref Range Status   Enterococcus faecalis NOT DETECTED NOT DETECTED Final   Enterococcus Faecium NOT DETECTED NOT DETECTED Final   Listeria monocytogenes NOT DETECTED NOT DETECTED Final   Staphylococcus species DETECTED (A) NOT DETECTED Final    Comment: CRITICAL RESULT CALLED TO, READ BACK BY AND VERIFIED WITH: Tillman Sers [facility] 08/11/20 2120 JDW    Staphylococcus aureus (BCID) NOT DETECTED NOT DETECTED Final   Staphylococcus  epidermidis DETECTED (A) NOT DETECTED Final    Comment: Methicillin (oxacillin) resistant coagulase negative staphylococcus. Possible blood culture contaminant (unless isolated from more than one blood culture draw or clinical case suggests pathogenicity). No antibiotic treatment is indicated for blood  culture contaminants. CRITICAL RESULT CALLED TO, READ BACK BY AND VERIFIED WITH: Tillman Sers [facility] 08/11/20 2120 JDW    Staphylococcus lugdunensis NOT DETECTED NOT DETECTED Final   Streptococcus species NOT DETECTED NOT DETECTED Final   Streptococcus agalactiae NOT DETECTED NOT DETECTED Final   Streptococcus pneumoniae NOT DETECTED NOT DETECTED Final   Streptococcus pyogenes NOT DETECTED NOT DETECTED Final   A.calcoaceticus-baumannii NOT DETECTED NOT DETECTED Final   Bacteroides fragilis NOT DETECTED NOT DETECTED Final   Enterobacterales NOT DETECTED NOT DETECTED Final   Enterobacter cloacae complex NOT DETECTED NOT DETECTED Final   Escherichia coli NOT DETECTED NOT DETECTED Final   Klebsiella aerogenes NOT DETECTED NOT DETECTED Final   Klebsiella oxytoca NOT DETECTED NOT DETECTED Final   Klebsiella pneumoniae NOT DETECTED NOT DETECTED Final   Proteus species NOT DETECTED NOT DETECTED Final   Salmonella species NOT DETECTED NOT DETECTED Final   Serratia marcescens NOT DETECTED NOT DETECTED Final   Haemophilus influenzae NOT DETECTED NOT DETECTED Final   Neisseria meningitidis NOT DETECTED NOT DETECTED Final   Pseudomonas aeruginosa NOT DETECTED NOT DETECTED Final   Stenotrophomonas maltophilia NOT DETECTED NOT DETECTED Final   Candida albicans NOT DETECTED NOT DETECTED Final   Candida auris NOT DETECTED NOT DETECTED Final   Candida glabrata NOT DETECTED NOT DETECTED Final   Candida krusei NOT DETECTED NOT DETECTED Final   Candida parapsilosis NOT DETECTED NOT DETECTED Final   Candida tropicalis NOT DETECTED NOT DETECTED Final   Cryptococcus neoformans/gattii NOT DETECTED NOT DETECTED  Final   Methicillin resistance mecA/C DETECTED (A) NOT DETECTED Final    Comment: CRITICAL RESULT CALLED TO, READ BACK BY AND VERIFIED WITHTillman Sers [facility] 08/11/20 2120 JDW Performed at [facility], [address] [place], [place] [address]   Culture, blood (Routine X 2) w Reflex to ID Panel     Status: None (Preliminary result)   Collection Time: 08/10/20  6:45 PM   Specimen: BLOOD  Result Value Ref Range Status   Specimen Description   Final    BLOOD BLOOD RIGHT WRIST Performed at [facility]  [address] [place] [place], [place] [address]    Special Requests   Final    BOTTLES DRAWN AEROBIC AND ANAEROBIC Blood Culture results may not be optimal due to an inadequate volume of blood received in culture bottles Performed at [facility], [address] [place], [place] [address]    Culture   Final    NO GROWTH < 12 HOURS Performed at [facility], [place] [address], [place], [place] [address]    Report Status PENDING  Incomplete  Respiratory Panel by RT PCR (Flu A&B, Covid) - Nasopharyngeal Swab     Status: None   Collection Time: 08/10/20  7:52 PM   Specimen: Nasopharyngeal Swab  Result Value Ref Range Status   SARS Coronavirus 2 by RT PCR NEGATIVE NEGATIVE Final    Comment: (NOTE) SARS-CoV-2 target nucleic acids are NOT DETECTED.  The SARS-CoV-2 RNA is generally detectable in upper respiratoy specimens during the acute phase of infection. The lowest concentration of SARS-CoV-2 viral copies this assay can detect is 131 copies/mL. A negative result does not preclude SARS-Cov-2 infection and should not be used as the sole basis for treatment or other patient management decisions. A negative result may occur with  improper specimen collection/handling, submission of specimen other than nasopharyngeal swab, presence of viral mutation(s) within the areas targeted by this assay, and inadequate number of viral copies (<131 copies/mL). A negative  result must be combined with clinical observations, patient history, and epidemiological information. The expected result is Negative.  Fact Sheet for Patients:  PinkCheek.be  Fact Sheet for Healthcare Providers:  GravelBags.it  This test is no t yet approved or cleared by the [place] FDA and  has been authorized for detection and/or diagnosis of SARS-CoV-2 by FDA under an Emergency Use Authorization (EUA). This EUA will remain  in effect (meaning this test can be used) for the duration of the COVID-19 declaration under Section 564(b)(1) of the Act, 21 U.S.C. section 360bbb-3(b)(1), unless the authorization is terminated or revoked sooner.     Influenza A by PCR NEGATIVE NEGATIVE Final   Influenza B by PCR NEGATIVE NEGATIVE Final    Comment: (NOTE) The Xpert Xpress SARS-CoV-2/FLU/RSV assay is intended as an aid in  the diagnosis of influenza from Nasopharyngeal swab specimens and  should not be used as a sole basis for treatment. Nasal washings and  aspirates are unacceptable for Xpert Xpress SARS-CoV-2/FLU/RSV  testing.  Fact Sheet for Patients: PinkCheek.be  Fact Sheet for Healthcare Providers: GravelBags.it  This test is not yet approved or cleared by the [place] FDA and  has been authorized for detection and/or diagnosis of SARS-CoV-2 by  FDA under an Emergency Use Authorization (EUA). This EUA will remain  in effect (meaning this test can be used) for the duration of the  Covid-19 declaration under Section 564(b)(1) of the Act, 21  U.S.C. section 360bbb-3(b)(1), unless the authorization is  terminated or revoked. Performed at [facility], [address], [place], [place] [address]          Radiology Studies: CT Abdomen Pelvis W Contrast  Result Date: 08/10/2020 CLINICAL DATA:  Epigastric abdominal pain and fever. EXAM: CT  ABDOMEN AND PELVIS WITH CONTRAST TECHNIQUE: Multidetector CT imaging of the abdomen and pelvis was performed using the standard protocol following bolus administration of intravenous contrast. CONTRAST:  184mL OMNIPAQUE IOHEXOL 300 MG/ML  SOLN COMPARISON:  None. FINDINGS: Lower chest: The lung bases are clear of  an acute process. Minimal streaky basilar atelectasis or scarring changes. The heart is upper limits of normal in size. No pericardial effusion. The distal esophagus is grossly normal. Hepatobiliary: No hepatic lesions are identified. Mild intrahepatic biliary dilatation is noted. The gallbladder is distended and demonstrates gallbladder wall thickening. There is also mild pericholecystic inflammatory changes. Gas containing gallstones are noted in the gallbladder. The largest measures 2.3 cm. The common bile duct is dilated to a maximum of 15 mm. Findings suspicious for a obstructing distal common bile duct stone. ERCP or MRCP may be helpful for further evaluation and treatment. Pancreas: No mass, inflammation or ductal dilatation. Prominent fatty interstices. Spleen: Normal size.  No focal lesions. Adrenals/Urinary Tract: The adrenal glands and kidneys are unremarkable. No worrisome renal lesions or obstructing ureteral calculi. The bladder is unremarkable. Stomach/Bowel: Surgical changes are noted involving the stomach from gastric bypass surgery. The duodenum, small bowel and colon are grossly normal without oral contrast. No acute inflammatory process, mass lesions or obstructive findings. Colonic diverticulosis without findings for acute diverticulitis. Vascular/Lymphatic: The aorta is normal in caliber. No dissection. The branch vessels are patent. The major venous structures are patent. No mesenteric or retroperitoneal mass or adenopathy. Small scattered lymph nodes are noted. Reproductive: The uterus and left ovary are unremarkable. The right ovary is surgically absent. Other: Periumbilical  abdominal wall hernia containing fat and a small bowel loop. No findings for incarceration or obstruction. Musculoskeletal: No significant bony findings. IMPRESSION: 1. CT findings consistent with acute calculus cholecystitis. There is also common bile duct dilatation and probable distal common bile duct stone. 2. ERCP or MRCP may be helpful for further evaluation and treatment. 3. Periumbilical abdominal wall hernia containing fat and a small bowel loop. No findings for incarceration or obstruction. 4. Surgical changes from gastric bypass surgery. Electronically Signed   By: Marijo Sanes M.D.   On: 08/10/2020 17:37   MR ABDOMEN MRCP WO CONTRAST  Result Date: 08/11/2020 CLINICAL DATA:  Cholelithiasis with biliary dilatation. Evaluate for CBD stone. EXAM: MRI ABDOMEN WITHOUT CONTRAST  (INCLUDING MRCP) TECHNIQUE: Multiplanar multisequence MR imaging of the abdomen was performed. Heavily T2-weighted images of the biliary and pancreatic ducts were obtained, and three-dimensional MRCP images were rendered by post processing. COMPARISON:  CT scan 08/10/2020. FINDINGS: Lower chest: Dependent atelectasis bilaterally Hepatobiliary: No suspicious focal abnormality within the liver parenchyma. Mild intrahepatic biliary duct dilatation evident. Gallbladder is distended with gallbladder wall irregularity and mild pericholecystic edema. Multiple stones identified in the lumen of the gallbladder measuring in the 2.0-2.5 cm size range. Common bile duct dilated up to 13 mm diameter. 7 mm stone is identified in the distal common bile duct, at the ampulla (well demonstrated on coronal image 23 of series 5). Pancreas: No focal mass lesion. No dilatation of the main duct. No intraparenchymal cyst. No peripancreatic edema. Spleen:  No splenomegaly. No focal mass lesion. Adrenals/Urinary Tract: No adrenal nodule or mass. Kidneys unremarkable. Stomach/Bowel: Stomach is unremarkable. No gastric wall thickening. No evidence of outlet  obstruction. Duodenum is normally positioned as is the ligament of Treitz. No small bowel or colonic dilatation within the visualized abdomen. Vascular/Lymphatic: No abdominal aortic aneurysm. There is no gastrohepatic or hepatoduodenal ligament lymphadenopathy. No retroperitoneal or mesenteric lymphadenopathy. Other:  No intraperitoneal free fluid. Musculoskeletal: No suspicious marrow signal abnormality within the visualized bony anatomy. IMPRESSION: 1. 7 mm stone identified in the distal common bile duct. Associated intra and extrahepatic biliary duct dilatation with common bile duct diameter measuring up to  13 mm. 2. Cholelithiasis with gallbladder wall irregularity and mild pericholecystic edema. Acute cholecystitis not excluded. Electronically Signed   By: Misty Stanley M.D.   On: 08/11/2020 07:12   MR 3D Recon At Scanner  Result Date: 08/11/2020 CLINICAL DATA:  Cholelithiasis with biliary dilatation. Evaluate for CBD stone. EXAM: MRI ABDOMEN WITHOUT CONTRAST  (INCLUDING MRCP) TECHNIQUE: Multiplanar multisequence MR imaging of the abdomen was performed. Heavily T2-weighted images of the biliary and pancreatic ducts were obtained, and three-dimensional MRCP images were rendered by post processing. COMPARISON:  CT scan 08/10/2020. FINDINGS: Lower chest: Dependent atelectasis bilaterally Hepatobiliary: No suspicious focal abnormality within the liver parenchyma. Mild intrahepatic biliary duct dilatation evident. Gallbladder is distended with gallbladder wall irregularity and mild pericholecystic edema. Multiple stones identified in the lumen of the gallbladder measuring in the 2.0-2.5 cm size range. Common bile duct dilated up to 13 mm diameter. 7 mm stone is identified in the distal common bile duct, at the ampulla (well demonstrated on coronal image 23 of series 5). Pancreas: No focal mass lesion. No dilatation of the main duct. No intraparenchymal cyst. No peripancreatic edema. Spleen:  No splenomegaly. No  focal mass lesion. Adrenals/Urinary Tract: No adrenal nodule or mass. Kidneys unremarkable. Stomach/Bowel: Stomach is unremarkable. No gastric wall thickening. No evidence of outlet obstruction. Duodenum is normally positioned as is the ligament of Treitz. No small bowel or colonic dilatation within the visualized abdomen. Vascular/Lymphatic: No abdominal aortic aneurysm. There is no gastrohepatic or hepatoduodenal ligament lymphadenopathy. No retroperitoneal or mesenteric lymphadenopathy. Other:  No intraperitoneal free fluid. Musculoskeletal: No suspicious marrow signal abnormality within the visualized bony anatomy. IMPRESSION: 1. 7 mm stone identified in the distal common bile duct. Associated intra and extrahepatic biliary duct dilatation with common bile duct diameter measuring up to 13 mm. 2. Cholelithiasis with gallbladder wall irregularity and mild pericholecystic edema. Acute cholecystitis not excluded. Electronically Signed   By: Misty Stanley M.D.   On: 08/11/2020 07:12   DG C-Arm 1-60 Min  Result Date: 08/12/2020 CLINICAL DATA:  Cholelithiasis.  Unsuccessful ERCP. EXAM: DG C-ARM 1-60 MIN CONTRAST:  None. FLUOROSCOPY TIME:  Radiation Exposure Index (if provided by the fluoroscopic device): 135.23 mGy. Number of Acquired Spot Images: 13. COMPARISON:  None. FINDINGS: Fluoroscopic guidance was provided during attempted ERCP. IMPRESSION: Fluoroscopic guidance was provided during attempted ERCP. Electronically Signed   By: Marijo Conception M.D.   On: 08/12/2020 12:41   IR PERCUTANEOUS TRANSHEPATIC CHOLANGIOGRAM  Result Date: 08/12/2020 INDICATION: 59 year old female with history of cholelithiasis presenting obstructive choledocholithiasis and acute calculus cholecystitis. The patient has a history of Roux-en-Y gastric bypass, therefore percutaneous biliary drainage is requested due to challenges to access the biliary tree in a retrograde fashion endoscopically. EXAM: Percutaneous cholangiogram with  external biliary drain placement MEDICATIONS: Patient is an inpatient and currently on intravenous Zosyn which was being administered during the procedure. ANESTHESIA/SEDATION: Moderate (conscious) sedation was employed during this procedure. A total of Versed 2 mg and Fentanyl 75 mcg was administered intravenously. Moderate Sedation Time: 23 minutes. The patient's level of consciousness and vital signs were monitored continuously by radiology nursing throughout the procedure under my direct supervision. FLUOROSCOPY TIME:  Fluoroscopy Time: 2 minutes 24 seconds (49 mGy). COMPLICATIONS: None immediate. PROCEDURE: Informed written consent was obtained from the patient after a thorough discussion of the procedural risks, benefits and alternatives. All questions were addressed. Maximal Sterile Barrier Technique was utilized including caps, mask, sterile gowns, sterile gloves, sterile drape, hand hygiene and skin antiseptic. A timeout  was performed prior to the initiation of the procedure. Preprocedure ultrasound of the right upper quadrant was performed which demonstrated a prominent left-sided bile duct with safe window for percutaneous puncture. The subxiphoid region was prepped and draped in standard fashion. Under sonographic guidance, a 22 gauge Chiba needle was directed to a tertiary biliary radicle in the left lobe. Gentle hand injection of contrast demonstrated position within the biliary tree. Limited cholangiogram demonstrated minimal dilation of the intra and extrahepatic bile ducts with obstruction of the distal common bile duct, compatible with recent MRCP. An 0.018" guidewire was inserted through the indwelling needle and directed to the common bile duct. A 6 French Accustick set was then placed over the guidewire, and the inner transition portions and guidewire removed. Additional contrast injection was performed to confirm location within the biliary tree. An Amplatz wire was then inserted and directed  to the distal common bile duct. At this point, the patient experienced significant pain as the guidewire contacted the indwelling distal common bile duct obstructive stone. Therefore attempt to internalize the biliary drain were aborted. Over the Amplatz guidewire, a 10 [place] multipurpose drainage catheter with the string cut was placed with the distal tip just beyond the confluence of the left and right biliary tree and the radiopaque marker within the peripheral left intrahepatic duct. Gentle hand injection of contrast demonstrated appropriate position within the biliary tree. The drain was sutured in place with 0 silk suture and a sterile dressing was applied. The drain was connected to bag drainage. The patient tolerated procedure well was transferred to the floor in stable condition. IMPRESSION: 1. Obstructive choledocholithiasis in the distal common bile duct. There is mild intra and extra hepatic biliary ductal dilation. 2. Successful placement of an external, 10 French left-sided biliary drain. Ruthann Cancer, MD [facility] Electronically Signed   By: Ruthann Cancer MD   On: 08/12/2020 08:05        Scheduled Meds: . metoprolol tartrate  5 mg Intravenous Q6H  . sodium chloride flush  5 mL Intracatheter Q8H   Continuous Infusions: . piperacillin-tazobactam (ZOSYN)  IV 3.375 g (08/12/20 0906)     LOS: 2 days     Georgette Shell, MD  08/12/2020, 2:39 PM

## 2020-08-12 NOTE — Anesthesia Preprocedure Evaluation (Addendum)
Anesthesia Evaluation  Patient identified by MRN, date of birth, ID band Patient awake    Reviewed: Allergy & Precautions, H&P , NPO status , Patient's Chart, lab work & pertinent test results  Airway Mallampati: II  TM Distance: >3 FB Neck ROM: Full    Dental no notable dental hx. (+) Teeth Intact, Dental Advisory Given   Pulmonary asthma ,    Pulmonary exam normal breath sounds clear to auscultation       Cardiovascular hypertension, Pt. on medications and Pt. on home beta blockers  Rhythm:Regular Rate:Normal     Neuro/Psych  Headaches, Anxiety Depression    GI/Hepatic Neg liver ROS, GERD  Medicated,  Endo/Other  Morbid obesity  Renal/GU negative Renal ROS  negative genitourinary   Musculoskeletal  (+) Arthritis , Osteoarthritis,    Abdominal   Peds  Hematology  (+) Blood dyscrasia, anemia ,   Anesthesia Other Findings   Reproductive/Obstetrics negative OB ROS                            Anesthesia Physical Anesthesia Plan  ASA: III  Anesthesia Plan: General   Post-op Pain Management:    Induction: Intravenous  PONV Risk Score and Plan: 4 or greater and Ondansetron, Dexamethasone and Midazolam  Airway Management Planned: Oral ETT  Additional Equipment:   Intra-op Plan:   Post-operative Plan: Extubation in OR  Informed Consent: I have reviewed the patients History and Physical, chart, labs and discussed the procedure including the risks, benefits and alternatives for the proposed anesthesia with the patient or authorized representative who has indicated his/her understanding and acceptance.     Dental advisory given  Plan Discussed with: CRNA  Anesthesia Plan Comments:         Anesthesia Quick Evaluation

## 2020-08-12 NOTE — Interval H&P Note (Signed)
History and Physical Interval Note:  08/12/2020 9:15 AM  Meghan Macdonald  has presented today for surgery, with the diagnosis of choledocholithiasis.  The various methods of treatment have been discussed with the patient and family. After consideration of risks, benefits and other options for treatment, the patient has consented to  Procedure(s): ENDOSCOPIC RETROGRADE CHOLANGIOPANCREATOGRAPHY (ERCP) (N/A) as a surgical intervention.  The patient's history has been reviewed, patient examined, no change in status, stable for surgery.  I have reviewed the patient's chart and labs.  Questions were answered to the patient's satisfaction.     The risks and benefits of endoscopic evaluation were discussed with the patient; these include but are not limited to the risk of perforation, infection, bleeding, missed lesions, lack of diagnosis, severe illness requiring hospitalization, as well as anesthesia and sedation related illnesses.  The patient is agreeable to proceed.   Higher than normal risk of enteroscopy portion due to previous surgical interventions and complications in regards to perforation but she understands this as well.    The risks of an ERCP were discussed at length, including but not limited to the risk of perforation, bleeding, abdominal pain, post-ERCP pancreatitis (while usually mild can be severe and even life threatening).    [employer]

## 2020-08-12 NOTE — Op Note (Addendum)
[facility] Patient Name: Meghan Macdonald Procedure Date : 08/12/2020 MRN: [id number] Attending MD: Justice Britain , MD Date of Birth: [birth year] CSN: [id number] Age: 59 Admit Type: Outpatient Procedure:                ERCP Indications:              Bile duct stone(s), Abnormal abdominal CT, Abnormal                            MRCP, Suspected ascending cholangitis, For therapy                            of ascending cholangitis, Jaundice, Abnormal liver                            function test Providers:                Justice Britain, MD, Burtis Junes, RN, Elspeth Cho Tech., Technician, Cira Servant, CRNA Referring MD:             Carlota Raspberry. Havery Moros, MD, [facility],                            Norvel Richards, MD Medicines:                General Anesthesia, Zosyn 3.375 g, Glucagon 0.75 mg                            IV, Indomethacin 100 mg PR (given after we were                            able to find the ampulla) Complications:            No immediate complications. Estimated Blood Loss:     Estimated blood loss was minimal. Procedure:                Pre-Anesthesia Assessment:                           - Prior to the procedure, a History and Physical                            was performed, and patient medications and                            allergies were reviewed. The patient's tolerance of                            previous anesthesia was also reviewed. The risks                            and benefits of the procedure and the sedation  options and risks were discussed with the patient.                            All questions were answered, and informed consent                            was obtained. Prior Anticoagulants: The patient has                            taken no previous anticoagulant or antiplatelet                            agents. ASA Grade Assessment: III - A patient with                             severe systemic disease. After reviewing the risks                            and benefits, the patient was deemed in                            satisfactory condition to undergo the procedure.                           After obtaining informed consent, the scope was                            passed under direct vision. Throughout the                            procedure, the patient's blood pressure, pulse, and                            oxygen saturations were monitored continuously. The                            PCF-H190DL ([id number]) Olympus pediatric colonoscope                            was introduced through the mouth, and used to                            inject contrast into and used to inject contrast                            into the bile duct. The ERCP was extremely                            difficult due to post-surgical anatomy, challenging                            cannulation because of abnormal anatomy and  challenging cannulation. Successful completion of                            the procedure was aided by performing the maneuvers                            documented (below) in this report. The patient                            tolerated the procedure. Scope In: Scope Out: Findings:      A scout film of the abdomen was obtained. Surgical clips were seen in       the area of the mid upper abdomen. One percutaneous drain ending in the       Right upper quadrant was seen.      The esophagus was successfully intubated under direct vision without       detailed examination of the pharynx, larynx, and associated structures.       The pediatric colonoscope was used for the examination of the upper       gastrointestinal tract. The scope was passed under direct vision through       the upper GI tract. No gross lesions were noted in the entire esophagus.       The Z-line was regular and was found 39 cm from the  incisors. A 2 cm       hiatal hernia was present. Patchy mildly erythematous mucosa without       bleeding was found in the entire examined stomach - biopsies obtained to       rule out H. pylori. Evidence of a gastric bypass was found. A gastric       pouch with a medium size was found. The staple line appeared intact. The       gastrojejunal anastomosis was characterized by healthy appearing mucosa.       The blind end was evaluated. The jejunal limb was traversed. The       pouch-to-jejunum limb measured approximately 40 cm from the anastomosis       and was characterized by healthy appearing mucosa. The jejunojejunal       anastomosis was characterized by healthy appearing mucosa. There are 3       limbs in this region, a blind limb, the efferent limb (placed tattoo in       this region initially as I had not found the afferent limb) and the       afferent limb (found 45 minutes after initiation of procedure).       Significant angulations were noted and fluroscopy was used in effort of       trying to reduct potential complications. The duodenum-to-jejunum limb       was examined 60 cm from the anastomosis and was characterized by healthy       appearing mucosa. The retroverted pylorus was noted. The major papilla       was edematous and congested with mild ulceration, but did not have       appearance of underlying malignancy but rather inflammation from the       likely impacted CBD stone noted on MRI and on the PBD/PTC yesterday.      We tried to go at the major papilla en-face but were not successful at  cannulation using the the tapered-tip cannula and Billroth II       sphincterotome including multiple wires (angled and straight 0.035 and       0.25 Jagwires). After more than an hour of attempting to cannulate the       papilla, decision made to stop the procedure.      A pancreatogram was not performed nor was the wire able to cannulate       this region.      The  duodenoscope was withdrawn from the patient. Impression:               - No gross lesions in esophagus. Z-line regular, 39                            cm from the incisors.                           - 2 cm hiatal hernia.                           - Erythematous mucosa in the stomach. Biopsied.                           - Gastric bypass with a medium-sized pouch and                            intact staple line. Gastrojejunal anastomosis                            characterized by healthy appearing mucosa.                           - Jejunal-jejunal anastomosis noted and both the                            afferent and efferent limbs were evaluated. Tattoo                            placed in efferent limb (initially I thought I had                            not reached the jejunal-jejunal anastomosis and so                            it had been placed for marking purposes). Afferent                            limb at singificant angulation.                           - The major papilla appeared edematous, congested,                            ulcerated.                           -  After >75 minutes at attempt at cannulation as                            noted above procedure was stopped. Recommendation:           - The patient will be observed post-procedure,                            until all discharge criteria are met.                           - Return patient to hospital ward for ongoing care.                           - Advance diet as tolerated.                           - Observe patient's clinical course.                           - Trend LFT pattern and WBC.                           - Watch for pancreatitis, bleeding, perforation,                            and cholangitis.                           - Patient will need transfer out of System, for                            what would ideally be a combined Cholecystectomy +                            ERCP Laparascopically.  Certainly if not feasible                            then Spiral v DBE ERCP could be considered prior to                            cholecystectomy. Proceeding with an EDGE procedure                            knowing that she needs her gallbladder out, seems                            out of proportion to what should be entertained but                            certainly would allow the [facility] centers to                            decide how to approach. Please move forward with  transfer request. GI happy to discuss after                            accepted to a facility if they have further                            questions.                           - The findings and recommendations were discussed                            with the patient.                           - The findings and recommendations were discussed                            with the patient's family. Procedure Code(s):        --- Professional ---                           [phone], Endoscopic retrograde                            cholangiopancreatography (ERCP); diagnostic,                            including collection of specimen(s) by brushing or                            washing, when performed (separate procedure) Diagnosis Code(s):        --- Professional ---                           K80.30, Calculus of bile duct with cholangitis,                            unspecified, without obstruction                           K44.9, Diaphragmatic hernia without obstruction or                            gangrene                           K31.89, Other diseases of stomach and duodenum                           Z98.84, Bariatric surgery status                           K83.09, Other cholangitis                           R17, Unspecified jaundice  R94.5, Abnormal results of liver function studies                           K83.8, Other specified diseases of biliary  tract                           R93.5, Abnormal findings on diagnostic imaging of                            other abdominal regions, including retroperitoneum                           R93.2, Abnormal findings on diagnostic imaging of                            liver and biliary tract CPT copyright 2019 [employer]. All rights reserved. The codes documented in this report are preliminary and upon coder review may  be revised to meet current compliance requirements. Justice Britain, MD 08/12/2020 12:54:36 PM Number of Addenda: 0

## 2020-08-13 ENCOUNTER — Other Ambulatory Visit: Payer: Self-pay | Admitting: Physician Assistant

## 2020-08-13 DIAGNOSIS — Z9884 Bariatric surgery status: Secondary | ICD-10-CM | POA: Diagnosis not present

## 2020-08-13 DIAGNOSIS — K8050 Calculus of bile duct without cholangitis or cholecystitis without obstruction: Secondary | ICD-10-CM | POA: Diagnosis not present

## 2020-08-13 LAB — CBC
HCT: 37.9 % (ref 36.0–46.0)
Hemoglobin: 11.8 g/dL — ABNORMAL LOW (ref 12.0–15.0)
MCH: 30 pg (ref 26.0–34.0)
MCHC: 31.1 g/dL (ref 30.0–36.0)
MCV: 96.4 fL (ref 80.0–100.0)
Platelets: 390 10*3/uL (ref 150–400)
RBC: 3.93 MIL/uL (ref 3.87–5.11)
RDW: 14 % (ref 11.5–15.5)
WBC: 7.7 10*3/uL (ref 4.0–10.5)
nRBC: 0 % (ref 0.0–0.2)

## 2020-08-13 LAB — COMPREHENSIVE METABOLIC PANEL
ALT: 106 U/L — ABNORMAL HIGH (ref 0–44)
AST: 44 U/L — ABNORMAL HIGH (ref 15–41)
Albumin: 2.4 g/dL — ABNORMAL LOW (ref 3.5–5.0)
Alkaline Phosphatase: 360 U/L — ABNORMAL HIGH (ref 38–126)
Anion gap: 13 (ref 5–15)
BUN: 7 mg/dL (ref 6–20)
CO2: 25 mmol/L (ref 22–32)
Calcium: 9 mg/dL (ref 8.9–10.3)
Chloride: 101 mmol/L (ref 98–111)
Creatinine, Ser: 0.67 mg/dL (ref 0.44–1.00)
GFR calc non Af Amer: >60 mL/min (ref >60)
Glucose, Bld: 146 mg/dL — ABNORMAL HIGH (ref 70–99)
Potassium: 3.6 mmol/L (ref 3.5–5.1)
Sodium: 139 mmol/L (ref 135–145)
Total Bilirubin: 7.7 mg/dL — ABNORMAL HIGH (ref 0.3–1.2)
Total Protein: 6.4 g/dL — ABNORMAL LOW (ref 6.5–8.1)

## 2020-08-13 LAB — GLUCOSE, CAPILLARY
Glucose-Capillary: 136 mg/dL — ABNORMAL HIGH (ref 70–99)
Glucose-Capillary: 142 mg/dL — ABNORMAL HIGH (ref 70–99)
Glucose-Capillary: 126 mg/dL — ABNORMAL HIGH (ref 70–99)

## 2020-08-13 LAB — CULTURE, BLOOD (ROUTINE X 2): Special Requests: ADEQUATE

## 2020-08-13 LAB — SURGICAL PATHOLOGY

## 2020-08-13 MED ORDER — NYSTATIN-TRIAMCINOLONE 100000-0.1 UNIT/GM-% EX CREA
TOPICAL_CREAM | Freq: Two times a day (BID) | CUTANEOUS | Status: DC
  Filled 2020-08-13: qty 15
  Filled 2020-08-13: qty 30

## 2020-08-13 NOTE — Progress Notes (Signed)
Contacted [facility] to see if we can transfer patient today. Notified that patient does not qualify for [facility] transport service ground or air as she is not on any continuous fluids or cardiac monitoring. MD contacted via text page

## 2020-08-13 NOTE — Progress Notes (Signed)
PROGRESS NOTE    Meghan Macdonald  QJJ:[id number] DOB: 1961-01-10 DOA: 08/10/2020 PCP: Robyne Peers, MD  Brief Narrative:  59 y.o. female with history of gastric bypass and paroxysmal supraventricular tachycardia on metoprolol presents to the ER at [facility] with complaints of abdominal pain.  Patient states her symptoms started about 2 weeks ago when patient had abdominal discomfort and urine was concentrated.  Patient had gone to the urgent care and was given antibiotics for urine tract infection.  Symptoms initially improved but over the last 2 days patient started getting abdominal pain mostly in the right upper quadrant particularly when eating.  Some nausea.  Denies any diarrhea.  Patient also start developing fever chills and at this time was concerned about the symptoms presents to the ER.  ED Course: In the ER patient had CT abdomen pelvis which shows features concerning for cholecystitis and also possible CBD stone.  LFTs were elevated with total bilirubin of 7.4 AST of 177 ALT of 270 alkaline phosphatase of 506.  WBC count was 15.7.  On-call general surgery was consulted patient started on empiric antibiotics fluids and admitted for further management.  Covid test was negative.  Assessment & Plan:   Active Problems:   Essential hypertension, benign   Acute cholecystitis   Choledocholithiasis   History of gastric bypass   #1 acute cholecystitis with CBD stone-patient is status post percutaneous cystostomy tube placement by interventional radiology 08/11/2020 and a trial of ERCP 08/12/2020 without success.Hulen Skains [facility] at [phone] talked with the hospitalist Dr. Illene Bolus who will be accepting the patient sometime today.  Patient will come back to [place] after the surgery once stabilized.   Patient has a very altered anatomy with previous multiple abdominal surgeries including ileocecectomy exploratory laparotomy right ovarian resection mesenteric resection for appendiceal  carcinoid in 2001.  She has also had Rouxin Y gastric bypass.   Continue Zosyn. LFTs are trending down.  White count normalized.   #2 paroxysmal SVT restart home meds continue Lopressor as needed.  Restart metoprolol.  Estimated body mass index is 55.27 kg/m as calculated from the following:   Height as of this encounter: 5\' 4"  (1.626 m).   Weight as of this encounter: 146.1 kg.  DVT prophylaxis: She is not on anything for DVT prophylaxis as she is still having procedures done.   Code Status: Full code  family Communication: None at bedside  disposition Plan:  Status is: Inpatient.  Patient to go to [facility] today for further management. Dispo: The patient is from: Home              Anticipated d/c is to: Home              Anticipated d/c date is: > 3 days              Patient currently is not medically stable to d/c.    Consultants:   GI and general surgery and interventional radiology  Procedures: Percutaneous cholecystostomy tube placed 08/11/2020 by interventional radiology. ERCP attempted today 08/12/2020 Antimicrobials: Zosyn  Subjective: She is ambulating in the room she slept well she complains of some nausea but no vomiting still has some abdominal pain drain in place with blood-tinged urine noted  Objective: Vitals:   08/12/20 1316 08/12/20 2207 08/13/20 0010 08/13/20 0442  BP: 136/63 127/77 140/80 (!) 156/95  Pulse: 74 84 73 79  Resp: 15 16 18 16   Temp:  98.6 F (37 C) 97.9 F (36.6 C)  97.7 F (36.5 C)  TempSrc:  Oral Oral Oral  SpO2: 92% 95% 98% 98%  Weight:      Height:        Intake/Output Summary (Last 24 hours) at 08/13/2020 1044 Last data filed at 08/13/2020 0445 Gross per 24 hour  Intake 1100 ml  Output 190 ml  Net 910 ml   Filed Weights   08/10/20 1600  Weight: (!) 146.1 kg    Examination:  General exam: Appears calm and comfortable  Respiratory system: Clear to auscultation. Respiratory effort normal. Cardiovascular system: S1 & S2 heard,  RRR. No JVD, murmurs, rubs, gallops or clicks. No pedal edema. Gastrointestinal system: Abdomen is nondistended, soft and tender. No organomegaly or masses felt. Normal bowel sounds heard. Central nervous system: Alert and oriented. No focal neurological deficits. Extremities: Symmetric 5 x 5 power. Skin: No rashes, lesions or ulcers Psychiatry: Judgement and insight appear normal. Mood & affect appropriate.     Data Reviewed: I have personally reviewed following labs and imaging studies  CBC: Recent Labs  Lab 08/10/20 1608 08/11/20 0101 08/12/20 0036 08/13/20 0222  WBC 15.7* 27.7* 14.3* 7.7  HGB 13.4 12.5 11.7* 11.8*  HCT 42.4 41.0 38.7 37.9  MCV 97.7 97.9 98.2 96.4  PLT 467* 400 327 865   Basic Metabolic Panel: Recent Labs  Lab 08/10/20 1608 08/11/20 0101 08/12/20 0036 08/13/20 0222  NA 137 139 138 139  K 3.8 3.5 3.3* 3.6  CL 98 103 103 101  CO2 25 23 24 25   GLUCOSE 142* 125* 131* 146*  BUN 10 9 8 7   CREATININE 0.42* 0.83 0.68 0.67  CALCIUM 9.0 9.1 8.7* 9.0   GFR: Estimated Creatinine Clearance: 110.5 mL/min (by C-G formula based on SCr of 0.67 mg/dL). Liver Function Tests: Recent Labs  Lab 08/10/20 1608 08/11/20 0101 08/12/20 0036 08/13/20 0222  AST 177* 133* 68* 44*  ALT 270* 224* 152* 106*  ALKPHOS 506* 476* 401* 360*  BILITOT 7.4* 8.0* 7.6* 7.7*  PROT 7.4 7.0 6.0* 6.4*  ALBUMIN 3.3* 2.8* 2.4* 2.4*   Recent Labs  Lab 08/10/20 1608  LIPASE 23   No results for input(s): AMMONIA in the last 168 hours. Coagulation Profile: Recent Labs  Lab 08/11/20 1222  INR 1.3*   Cardiac Enzymes: No results for input(s): CKTOTAL, CKMB, CKMBINDEX, TROPONINI in the last 168 hours. BNP (last 3 results) No results for input(s): PROBNP in the last 8760 hours. HbA1C: No results for input(s): HGBA1C in the last 72 hours. CBG: Recent Labs  Lab 08/12/20 0025 08/12/20 0807 08/12/20 1618 08/13/20 0429 08/13/20 0739  GLUCAP 137* 109* 152* 136* 142*   Lipid  Profile: No results for input(s): CHOL, HDL, LDLCALC, TRIG, CHOLHDL, LDLDIRECT in the last 72 hours. Thyroid Function Tests: No results for input(s): TSH, T4TOTAL, FREET4, T3FREE, THYROIDAB in the last 72 hours. Anemia Panel: No results for input(s): VITAMINB12, FOLATE, FERRITIN, TIBC, IRON, RETICCTPCT in the last 72 hours. Sepsis Labs: No results for input(s): PROCALCITON, LATICACIDVEN in the last 168 hours.  Recent Results (from the past 240 hour(s))  Culture, blood (Routine X 2) w Reflex to ID Panel     Status: Abnormal   Collection Time: 08/10/20  6:25 PM   Specimen: BLOOD  Result Value Ref Range Status   Specimen Description   Final    BLOOD RIGHT ANTECUBITAL Performed at [facility], [address], [place], [place] [address]    Special Requests   Final    BOTTLES  DRAWN AEROBIC AND ANAEROBIC Blood Culture adequate volume Performed at [facility], [address] [place], [place] [address]    Culture  Setup Time   Final    GRAM POSITIVE COCCI IN BOTH AEROBIC AND ANAEROBIC BOTTLES CRITICAL RESULT CALLED TO, READ BACK BY AND VERIFIED WITH: Tillman Sers [facility] 08/11/20 2120 JDW    Culture (A)  Final    STAPHYLOCOCCUS EPIDERMIDIS THE SIGNIFICANCE OF ISOLATING THIS ORGANISM FROM A SINGLE SET OF BLOOD CULTURES WHEN MULTIPLE SETS ARE DRAWN IS UNCERTAIN. PLEASE NOTIFY THE MICROBIOLOGY DEPARTMENT WITHIN ONE WEEK IF SPECIATION AND SENSITIVITIES ARE REQUIRED. Performed at [facility], [place] [address] [place], [place] [address]    Report Status 08/13/2020 FINAL  Final  Blood Culture ID Panel (Reflexed)     Status: Abnormal   Collection Time: 08/10/20  6:25 PM  Result Value Ref Range Status   Enterococcus faecalis NOT DETECTED NOT DETECTED Final   Enterococcus Faecium NOT DETECTED NOT DETECTED Final   Listeria monocytogenes NOT DETECTED NOT DETECTED Final   Staphylococcus species DETECTED (A) NOT DETECTED Final    Comment: CRITICAL RESULT CALLED TO, READ BACK  BY AND VERIFIED WITH: Tillman Sers [facility] 08/11/20 2120 JDW    Staphylococcus aureus (BCID) NOT DETECTED NOT DETECTED Final   Staphylococcus epidermidis DETECTED (A) NOT DETECTED Final    Comment: Methicillin (oxacillin) resistant coagulase negative staphylococcus. Possible blood culture contaminant (unless isolated from more than one blood culture draw or clinical case suggests pathogenicity). No antibiotic treatment is indicated for blood  culture contaminants. CRITICAL RESULT CALLED TO, READ BACK BY AND VERIFIED WITH: Tillman Sers [facility] 08/11/20 2120 JDW    Staphylococcus lugdunensis NOT DETECTED NOT DETECTED Final   Streptococcus species NOT DETECTED NOT DETECTED Final   Streptococcus agalactiae NOT DETECTED NOT DETECTED Final   Streptococcus pneumoniae NOT DETECTED NOT DETECTED Final   Streptococcus pyogenes NOT DETECTED NOT DETECTED Final   A.calcoaceticus-baumannii NOT DETECTED NOT DETECTED Final   Bacteroides fragilis NOT DETECTED NOT DETECTED Final   Enterobacterales NOT DETECTED NOT DETECTED Final   Enterobacter cloacae complex NOT DETECTED NOT DETECTED Final   Escherichia coli NOT DETECTED NOT DETECTED Final   Klebsiella aerogenes NOT DETECTED NOT DETECTED Final   Klebsiella oxytoca NOT DETECTED NOT DETECTED Final   Klebsiella pneumoniae NOT DETECTED NOT DETECTED Final   Proteus species NOT DETECTED NOT DETECTED Final   Salmonella species NOT DETECTED NOT DETECTED Final   Serratia marcescens NOT DETECTED NOT DETECTED Final   Haemophilus influenzae NOT DETECTED NOT DETECTED Final   Neisseria meningitidis NOT DETECTED NOT DETECTED Final   Pseudomonas aeruginosa NOT DETECTED NOT DETECTED Final   Stenotrophomonas maltophilia NOT DETECTED NOT DETECTED Final   Candida albicans NOT DETECTED NOT DETECTED Final   Candida auris NOT DETECTED NOT DETECTED Final   Candida glabrata NOT DETECTED NOT DETECTED Final   Candida krusei NOT DETECTED NOT DETECTED Final   Candida parapsilosis NOT  DETECTED NOT DETECTED Final   Candida tropicalis NOT DETECTED NOT DETECTED Final   Cryptococcus neoformans/gattii NOT DETECTED NOT DETECTED Final   Methicillin resistance mecA/C DETECTED (A) NOT DETECTED Final    Comment: CRITICAL RESULT CALLED TO, READ BACK BY AND VERIFIED WITHTillman Sers [facility] 08/11/20 2120 JDW Performed at [facility], [address] [place], [place] [address]   Culture, blood (Routine X 2) w Reflex to ID Panel     Status: None (Preliminary result)   Collection Time: 08/10/20  6:45 PM  Specimen: BLOOD  Result Value Ref Range Status   Specimen Description   Final    BLOOD BLOOD RIGHT WRIST Performed at [facility], [address] [place], [place] [address]    Special Requests   Final    BOTTLES DRAWN AEROBIC AND ANAEROBIC Blood Culture results may not be optimal due to an inadequate volume of blood received in culture bottles Performed at [facility], [address] [place], [place] [address]    Culture   Final    NO GROWTH 3 DAYS Performed at [facility], [place] [address] [place], [place] [address]    Report Status PENDING  Incomplete  Respiratory Panel by RT PCR (Flu A&B, Covid) - Nasopharyngeal Swab     Status: None   Collection Time: 08/10/20  7:52 PM   Specimen: Nasopharyngeal Swab  Result Value Ref Range Status   SARS Coronavirus 2 by RT PCR NEGATIVE NEGATIVE Final    Comment: (NOTE) SARS-CoV-2 target nucleic acids are NOT DETECTED.  The SARS-CoV-2 RNA is generally detectable in upper respiratoy specimens during the acute phase of infection. The lowest concentration of SARS-CoV-2 viral copies this assay can detect is 131 copies/mL. A negative result does not preclude SARS-Cov-2 infection and should not be used as the sole basis for treatment or other patient management decisions. A negative result may occur with  improper specimen collection/handling, submission of specimen other than nasopharyngeal swab,  presence of viral mutation(s) within the areas targeted by this assay, and inadequate number of viral copies (<131 copies/mL). A negative result must be combined with clinical observations, patient history, and epidemiological information. The expected result is Negative.  Fact Sheet for Patients:  PinkCheek.be  Fact Sheet for Healthcare Providers:  GravelBags.it  This test is no t yet approved or cleared by the [place] FDA and  has been authorized for detection and/or diagnosis of SARS-CoV-2 by FDA under an Emergency Use Authorization (EUA). This EUA will remain  in effect (meaning this test can be used) for the duration of the COVID-19 declaration under Section 564(b)(1) of the Act, 21 U.S.C. section 360bbb-3(b)(1), unless the authorization is terminated or revoked sooner.     Influenza A by PCR NEGATIVE NEGATIVE Final   Influenza B by PCR NEGATIVE NEGATIVE Final    Comment: (NOTE) The Xpert Xpress SARS-CoV-2/FLU/RSV assay is intended as an aid in  the diagnosis of influenza from Nasopharyngeal swab specimens and  should not be used as a sole basis for treatment. Nasal washings and  aspirates are unacceptable for Xpert Xpress SARS-CoV-2/FLU/RSV  testing.  Fact Sheet for Patients: PinkCheek.be  Fact Sheet for Healthcare Providers: GravelBags.it  This test is not yet approved or cleared by the [place] FDA and  has been authorized for detection and/or diagnosis of SARS-CoV-2 by  FDA under an Emergency Use Authorization (EUA). This EUA will remain  in effect (meaning this test can be used) for the duration of the  Covid-19 declaration under Section 564(b)(1) of the Act, 21  U.S.C. section 360bbb-3(b)(1), unless the authorization is  terminated or revoked. Performed at [facility], [address], [place], [place] [address]           Radiology Studies: DG Chest 1 View  Result Date: 08/12/2020 CLINICAL DATA:  Hypoxia. EXAM: CHEST  1 VIEW COMPARISON:  June 05, 2014. FINDINGS: Stable cardiomegaly. No pneumothorax or pleural effusion is noted. Minimal subsegmental atelectasis is noted in the lingular  region of the left upper lobe. Minimal scarring or subsegmental atelectasis is noted in the right upper lobe. Mild right basilar subsegmental atelectasis is noted. Bony thorax is unremarkable. IMPRESSION: Minimal to mild bilateral lung opacities as described above. Electronically Signed   By: Marijo Conception M.D.   On: 08/12/2020 16:47   DG C-Arm 1-60 Min  Result Date: 08/12/2020 CLINICAL DATA:  Cholelithiasis.  Unsuccessful ERCP. EXAM: DG C-ARM 1-60 MIN CONTRAST:  None. FLUOROSCOPY TIME:  Radiation Exposure Index (if provided by the fluoroscopic device): 135.23 mGy. Number of Acquired Spot Images: 13. COMPARISON:  None. FINDINGS: Fluoroscopic guidance was provided during attempted ERCP. IMPRESSION: Fluoroscopic guidance was provided during attempted ERCP. Electronically Signed   By: Marijo Conception M.D.   On: 08/12/2020 12:41   IR BILIARY DRAIN PLACEMENT WITH CHOLANGIOGRAM  Result Date: 08/12/2020 Narrative & Impression INDICATION: 59 year old female with history of cholelithiasis presenting obstructive choledocholithiasis and acute calculus cholecystitis. The patient has a history of Roux-en-Y gastric bypass, therefore percutaneous biliary drainage is requested due to challenges to access the biliary tree in a retrograde fashion endoscopically.  EXAM: Percutaneous cholangiogram with external biliary drain placement  MEDICATIONS: Patient is an inpatient and currently on intravenous Zosyn which was being administered during the procedure.  ANESTHESIA/SEDATION: Moderate (conscious) sedation was employed during this procedure. A total of Versed 2 mg and Fentanyl 75 mcg was administered intravenously.  Moderate Sedation Time: 23  minutes. The patient's level of consciousness and vital signs were monitored continuously by radiology nursing throughout the procedure under my direct supervision.  FLUOROSCOPY TIME:  Fluoroscopy Time: 2 minutes 24 seconds (49 mGy).  COMPLICATIONS: None immediate.  PROCEDURE: Informed written consent was obtained from the patient after a thorough discussion of the procedural risks, benefits and alternatives. All questions were addressed. Maximal Sterile Barrier Technique was utilized including caps, mask, sterile gowns, sterile gloves, sterile drape, hand hygiene and skin antiseptic. A timeout was performed prior to the initiation of the procedure.  Preprocedure ultrasound of the right upper quadrant was performed which demonstrated a prominent left-sided bile duct with safe window for percutaneous puncture. The subxiphoid region was prepped and draped in standard fashion. Under sonographic guidance, a 22 gauge Chiba needle was directed to a tertiary biliary radicle in the left lobe. Gentle hand injection of contrast demonstrated position within the biliary tree. Limited cholangiogram demonstrated minimal dilation of the intra and extrahepatic bile ducts with obstruction of the distal common bile duct, compatible with recent MRCP. An 0.018" guidewire was inserted through the indwelling needle and directed to the common bile duct. A 6 French Accustick set was then placed over the guidewire, and the inner transition portions and guidewire removed. Additional contrast injection was performed to confirm location within the biliary tree. An Amplatz wire was then inserted and directed to the distal common bile duct. At this point, the patient experienced significant pain as the guidewire contacted the indwelling distal common bile duct obstructive stone. Therefore attempt to internalize the biliary drain were aborted. Over the Amplatz guidewire, a 10 [place] multipurpose drainage catheter with the string cut was  placed with the distal tip just beyond the confluence of the left and right biliary tree and the radiopaque marker within the peripheral left intrahepatic duct. Gentle hand injection of contrast demonstrated appropriate position within the biliary tree. The drain was sutured in place with 0 silk suture and a sterile dressing was applied. The drain was connected to bag drainage.  The patient tolerated procedure well  was transferred to the floor in stable condition.  IMPRESSION: 1. Obstructive choledocholithiasis in the distal common bile duct. There is mild intra and extra hepatic biliary ductal dilation. 2. Successful placement of an external, 10 French left-sided biliary drain.  Ruthann Cancer, MD  [facility]  [facility]   Electronically Signed   By: Ruthann Cancer MD   On: 08/12/2020 08:05        Scheduled Meds: . metoprolol succinate  100 mg Oral Daily  . sodium chloride flush  5 mL Intracatheter Q8H   Continuous Infusions: . piperacillin-tazobactam (ZOSYN)  IV 3.375 g (08/13/20 0815)     LOS: 3 days     Georgette Shell, MD  08/13/2020, 10:44 AM

## 2020-08-13 NOTE — Anesthesia Postprocedure Evaluation (Signed)
Anesthesia Post Note  Patient: Meghan Macdonald  Procedure(s) Performed: ENDOSCOPIC RETROGRADE CHOLANGIOPANCREATOGRAPHY (ERCP) (N/A ) ENTEROSCOPY (N/A ) BIOPSY SUBMUCOSAL TATTOO INJECTION     Patient location during evaluation: PACU Anesthesia Type: General Level of consciousness: awake and alert Pain management: pain level controlled Vital Signs Assessment: post-procedure vital signs reviewed and stable Respiratory status: spontaneous breathing, nonlabored ventilation and respiratory function stable Cardiovascular status: blood pressure returned to baseline and stable Postop Assessment: no apparent nausea or vomiting Anesthetic complications: no   No complications documented.  Last Vitals:  Vitals:   08/13/20 0010 08/13/20 0442  BP: 140/80 (!) 156/95  Pulse: 73 79  Resp: 18 16  Temp: 36.6 C 36.5 C  SpO2: 98% 98%    Last Pain:  Vitals:   08/13/20 0442  TempSrc: Oral  PainSc:                  Rhoderick Farrel,W. EDMOND

## 2020-08-13 NOTE — Progress Notes (Signed)
Pt scheduled for transfer to [place] today. Report called to receiving facility nurse and [facility] ambulance transport requested. No timeframe given for transport arrival

## 2020-08-13 NOTE — Progress Notes (Signed)
Pt transferred to [facility] this evening

## 2020-08-13 NOTE — Progress Notes (Signed)
Progress Note   Subjective  Patient doing okay today. Pain controlled. WBC downtrending nicely. LAEs stable. ERCP attempted per Dr. Rush Landmark yesterday, unfortunately was not successful. She is pending a transfer to [facility] for further management.   Objective   Vital signs in last 24 hours: Temp:  [96.7 F (35.9 C)-98.6 F (37 C)] 97.7 F (36.5 C) (10/08 0442) Pulse Rate:  [72-84] 79 (10/08 0442) Resp:  [15-21] 16 (10/08 0442) BP: (127-156)/(56-95) 156/95 (10/08 0442) SpO2:  [91 %-98 %] 98 % (10/08 0442) Last BM Date: 08/10/20 General:    white female in NAD, jaundiced Abdomen:  Soft, mild epigastric TTP, obese abdomen, nondistended.  Neurologic:  Alert and oriented,  grossly normal neurologically. Psych:  Cooperative. Normal mood and affect.  Intake/Output from previous day: 10/07 0701 - 10/08 0700 In: 1850 [P.O.:300; I.V.:1300; IV Piggyback:250] Out: 190 [Drains:190] Intake/Output this shift: No intake/output data recorded.  Lab Results: Recent Labs    08/11/20 0101 08/12/20 0036 08/13/20 0222  WBC 27.7* 14.3* 7.7  HGB 12.5 11.7* 11.8*  HCT 41.0 38.7 37.9  PLT 400 327 390   BMET Recent Labs    08/11/20 0101 08/12/20 0036 08/13/20 0222  NA 139 138 139  K 3.5 3.3* 3.6  CL 103 103 101  CO2 23 24 25   GLUCOSE 125* 131* 146*  BUN 9 8 7   CREATININE 0.83 0.68 0.67  CALCIUM 9.1 8.7* 9.0   LFT Recent Labs    08/11/20 0101 08/12/20 0036 08/13/20 0222  PROT 7.0   < > 6.4*  ALBUMIN 2.8*   < > 2.4*  AST 133*   < > 44*  ALT 224*   < > 106*  ALKPHOS 476*   < > 360*  BILITOT 8.0*   < > 7.7*  BILIDIR 5.2*  --   --   IBILI 2.8*  --   --    < > = values in this interval not displayed.   PT/INR Recent Labs    08/11/20 1222  LABPROT 15.3*  INR 1.3*    Studies/Results: DG Chest 1 View  Result Date: 08/12/2020 CLINICAL DATA:  Hypoxia. EXAM: CHEST  1 VIEW COMPARISON:  June 05, 2014. FINDINGS: Stable cardiomegaly. No pneumothorax or pleural effusion  is noted. Minimal subsegmental atelectasis is noted in the lingular region of the left upper lobe. Minimal scarring or subsegmental atelectasis is noted in the right upper lobe. Mild right basilar subsegmental atelectasis is noted. Bony thorax is unremarkable. IMPRESSION: Minimal to mild bilateral lung opacities as described above. Electronically Signed   By: Marijo Conception M.D.   On: 08/12/2020 16:47   DG C-Arm 1-60 Min  Result Date: 08/12/2020 CLINICAL DATA:  Cholelithiasis.  Unsuccessful ERCP. EXAM: DG C-ARM 1-60 MIN CONTRAST:  None. FLUOROSCOPY TIME:  Radiation Exposure Index (if provided by the fluoroscopic device): 135.23 mGy. Number of Acquired Spot Images: 13. COMPARISON:  None. FINDINGS: Fluoroscopic guidance was provided during attempted ERCP. IMPRESSION: Fluoroscopic guidance was provided during attempted ERCP. Electronically Signed   By: Marijo Conception M.D.   On: 08/12/2020 12:41   IR BILIARY DRAIN PLACEMENT WITH CHOLANGIOGRAM  Result Date: 08/12/2020 Narrative & Impression INDICATION: 59 year old female with history of cholelithiasis presenting obstructive choledocholithiasis and acute calculus cholecystitis. The patient has a history of Roux-en-Y gastric bypass, therefore percutaneous biliary drainage is requested due to challenges to access the biliary tree in a retrograde fashion endoscopically.  EXAM: Percutaneous cholangiogram with external biliary drain placement  MEDICATIONS:  Patient is an inpatient and currently on intravenous Zosyn which was being administered during the procedure.  ANESTHESIA/SEDATION: Moderate (conscious) sedation was employed during this procedure. A total of Versed 2 mg and Fentanyl 75 mcg was administered intravenously.  Moderate Sedation Time: 23 minutes. The patient's level of consciousness and vital signs were monitored continuously by radiology nursing throughout the procedure under my direct supervision.  FLUOROSCOPY TIME:  Fluoroscopy Time: 2 minutes  24 seconds (49 mGy).  COMPLICATIONS: None immediate.  PROCEDURE: Informed written consent was obtained from the patient after a thorough discussion of the procedural risks, benefits and alternatives. All questions were addressed. Maximal Sterile Barrier Technique was utilized including caps, mask, sterile gowns, sterile gloves, sterile drape, hand hygiene and skin antiseptic. A timeout was performed prior to the initiation of the procedure.  Preprocedure ultrasound of the right upper quadrant was performed which demonstrated a prominent left-sided bile duct with safe window for percutaneous puncture. The subxiphoid region was prepped and draped in standard fashion. Under sonographic guidance, a 22 gauge Chiba needle was directed to a tertiary biliary radicle in the left lobe. Gentle hand injection of contrast demonstrated position within the biliary tree. Limited cholangiogram demonstrated minimal dilation of the intra and extrahepatic bile ducts with obstruction of the distal common bile duct, compatible with recent MRCP. An 0.018" guidewire was inserted through the indwelling needle and directed to the common bile duct. A 6 French Accustick set was then placed over the guidewire, and the inner transition portions and guidewire removed. Additional contrast injection was performed to confirm location within the biliary tree. An Amplatz wire was then inserted and directed to the distal common bile duct. At this point, the patient experienced significant pain as the guidewire contacted the indwelling distal common bile duct obstructive stone. Therefore attempt to internalize the biliary drain were aborted. Over the Amplatz guidewire, a 10 [place] multipurpose drainage catheter with the string cut was placed with the distal tip just beyond the confluence of the left and right biliary tree and the radiopaque marker within the peripheral left intrahepatic duct. Gentle hand injection of contrast demonstrated appropriate  position within the biliary tree. The drain was sutured in place with 0 silk suture and a sterile dressing was applied. The drain was connected to bag drainage.  The patient tolerated procedure well was transferred to the floor in stable condition.  IMPRESSION: 1. Obstructive choledocholithiasis in the distal common bile duct. There is mild intra and extra hepatic biliary ductal dilation. 2. Successful placement of an external, 10 French left-sided biliary drain.  Ruthann Cancer, MD  [facility]  [facility]   Electronically Signed   By: Ruthann Cancer MD   On: 08/12/2020 08:05       Assessment / Plan:    59 y/o female with morbid obesity s/p roux-en-Y gastric bypass in 2001, admitted with choledocholithiasis and concern for associated cholecystitis. She is s/p percutaneous biliary drain per IR, initially had WBC of 27 which has since improved. She had an attempted ERCP with Dr. Rush Landmark yesterday, unfortunately unsuccessful cannulation of the bile duct. She has no symptoms concerning for post ERCP pancreatitis and otherwise is stable at this time. We have discussed with our surgical colleagues the possibility for combined cholecystectomy with surgical access for ERCP here, however they were not comfortable with that. We have since made transfer request to [facility] for definitive management - cholecystectomy with ERCP. It appears they have a bed for her, she is not  sure when transfer will happen. She will have her procedures done there and once stable will be transferred back to [facility] for the rest of her hospitalization while she recovers. WBC has normalized which is good. ALT/AST slowly downtrending, bili relatively stable. Please call with questions in the interim, will follow peripherally for now.  [place] Cellar, MD [facility]

## 2020-08-13 NOTE — Progress Notes (Signed)
TRANSPORT INFORMATION: [facility] [place], [place] [address] [address] - [phone] Call [phone] for nursing report Floydene Flock - [phone] Patient will need to be transported with chart all imaging completed at [facility] (radiology can power share images).

## 2020-08-14 NOTE — Progress Notes (Signed)
Pharmacy Antibiotic Note  Meghan Macdonald is a 59 y.o. female admitted on 08/10/2020 with acute cholecystitis.  Pharmacy has been consulted for Zosyn dosing.  WBC improved to 7.7, afebrile, creatinine clearance stable at baseline.   Plan: - Continue Zosyn 3.375g IV q8h (4 hour infusion).  - Follow-up transfer out plans  Height: 5\' 4"  (162.6 cm) Weight: (!) 146.1 kg (322 lb) IBW/kg (Calculated) : 54.7  Temp (24hrs), Avg:97.9 F (36.6 C), Min:97.9 F (36.6 C), Max:97.9 F (36.6 C)  Recent Labs  Lab 08/10/20 1608 08/11/20 0101 08/12/20 0036 08/13/20 0222  WBC 15.7* 27.7* 14.3* 7.7  CREATININE 0.42* 0.83 0.68 0.67    Estimated Creatinine Clearance: 110.5 mL/min (by C-G formula based on SCr of 0.67 mg/dL).    Allergies  Allergen Reactions  . Azithromycin Other (See Comments)    Upset stomach       Malaya Cagley L. Devin Going, [place] PGY2 Pharmacy Resident Weekends 7:00 am - 3:00 pm, please call [phone] 08/14/20      9:54 AM  Please check AMION for all [place] phone numbers After 10:00 PM, call the [place] [phone]

## 2020-08-15 ENCOUNTER — Encounter (HOSPITAL_COMMUNITY): Payer: Self-pay | Admitting: Gastroenterology

## 2020-08-15 DIAGNOSIS — E876 Hypokalemia: Secondary | ICD-10-CM | POA: Insufficient documentation

## 2020-08-15 LAB — CULTURE, BLOOD (ROUTINE X 2): Culture: NO GROWTH

## 2020-08-16 ENCOUNTER — Ambulatory Visit: Payer: Managed Care, Other (non HMO) | Admitting: Physician Assistant

## 2020-08-16 NOTE — Discharge Summary (Signed)
Physician Discharge Summary  Meghan Macdonald XQJ:[id number] DOB: [birth year] DOA: 08/10/2020  PCP: Robyne Peers, MD  Admit date: 08/10/2020 Discharge date: 08/16/2020  Admitted From: Home Disposition: [facility] Recommendations for Outpatient Follow-up:  1. Follow up with PCP in 1-2 weeks 2. Please obtain BMP/CBC in one week  Home Health: None  equipment/Devices: None Discharge Condition stable CODE STATUS full code Diet recommendation cardiac  brief/Interim Summary:59 y.o.femalewithhistory of gastric bypass and paroxysmal supraventricular tachycardia on metoprolol presents to the ER at [facility] with complaints of abdominal pain. Patient states her symptoms started about 2 weeks ago when patient had abdominal discomfort and urine was concentrated. Patient had gone to the urgent care and was given antibiotics for urine tract infection. Symptoms initially improved but over the last 2 days patient started getting abdominal pain mostly in the right upper quadrant particularly when eating. Some nausea. Denies any diarrhea. Patient also start developing fever chills and at this time was concerned about the symptoms presents to the ER.  ED Course:In the ER patient had CT abdomen pelvis which shows features concerning for cholecystitis and also possible CBD stone. LFTs were elevated with total bilirubin of 7.4 AST of 177 ALT of 270 alkaline phosphatase of 506. WBC count was 15.7. On-call general surgery was consulted patient started on empiric antibiotics fluids and admitted for further management. Covid test was negative.   Discharge Diagnoses:  Active Problems:   Essential hypertension, benign   Acute cholecystitis   Choledocholithiasis   History of gastric bypass   #1 acute cholecystitis with CBD stone-patient is status post percutaneous cystostomy tube placement by interventional radiology 08/11/2020 and a trial of ERCP 08/12/2020 without success.Hulen Skains [facility] at  [phone] talked with the hospitalist Dr. Illene Bolus who will be accepting the patient sometime today.  Patient will come back to [place] after the surgery once stabilized.   Patient has a very altered anatomy with previous multiple abdominal surgeries including ileocecectomy exploratory laparotomy right ovarian resection mesenteric resection for appendiceal carcinoid in 2001.  She has also had Rouxin Y gastric bypass.   Continue Zosyn. LFTs are trending down.  White count normalized.   #2 paroxysmal SVT restart home meds continue Lopressor as needed.  Restart metoprolol  Estimated body mass index is 55.27 kg/m as calculated from the following:   Height as of this encounter: 5\' 4"  (1.626 m).   Weight as of this encounter: 146.1 kg.  Discharge Instructions  Discharge Instructions    Diet - low sodium heart healthy   Complete by: As directed    Increase activity slowly   Complete by: As directed    No wound care   Complete by: As directed      Allergies as of 08/16/2020      Reactions   Azithromycin Other (See Comments)   Upset stomach      Medication List    STOP taking these medications   cefdinir 300 MG capsule Commonly known as: OMNICEF   zolpidem 5 MG tablet Commonly known as: AMBIEN     TAKE these medications   acetaminophen 500 MG tablet Commonly known as: TYLENOL Take 500 mg by mouth every 6 (six) hours as needed (for pain/headache.).   albuterol 108 (90 Base) MCG/ACT inhaler Commonly known as: VENTOLIN HFA Inhale 1-2 puffs into the lungs every 6 (six) hours as needed for wheezing or shortness of breath.   Bariatric Multivitamins/Iron Caps Take 1 capsule by mouth in the morning, at noon, and at bedtime.  cholecalciferol 1000 units tablet Commonly known as: VITAMIN D Take 1,000 Units by mouth daily.   DULoxetine 60 MG capsule Commonly known as: CYMBALTA Take 60 mg by mouth 2 (two) times daily.   fexofenadine-pseudoephedrine 180-240 MG 24 hr tablet Commonly  known as: ALLEGRA-D 24 Take 1 tablet by mouth daily as needed (for allergies).   fluticasone 50 MCG/ACT nasal spray Commonly known as: FLONASE Place 1 spray into both nostrils daily as needed for allergies or rhinitis.   metoprolol succinate 100 MG 24 hr tablet Commonly known as: TOPROL-XL Take 1 tablet (100 mg total) by mouth daily. Take with or immediately following a meal.   RABEprazole 20 MG tablet Commonly known as: ACIPHEX TAKE 1 TABLET BY MOUTH ONCE DAILY BEFORE BREAKFAST What changed:   how much to take  how to take this  when to take this  additional instructions   vitamin B-12 50 MCG tablet Commonly known as: CYANOCOBALAMIN Take 50 mcg by mouth daily.       Allergies  Allergen Reactions  . Azithromycin Other (See Comments)    Upset stomach    Consultations:  GI and general surgery and interventional radiology   Procedures/Studies: DG Chest 1 View  Result Date: 08/12/2020 CLINICAL DATA:  Hypoxia. EXAM: CHEST  1 VIEW COMPARISON:  June 05, 2014. FINDINGS: Stable cardiomegaly. No pneumothorax or pleural effusion is noted. Minimal subsegmental atelectasis is noted in the lingular region of the left upper lobe. Minimal scarring or subsegmental atelectasis is noted in the right upper lobe. Mild right basilar subsegmental atelectasis is noted. Bony thorax is unremarkable. IMPRESSION: Minimal to mild bilateral lung opacities as described above. Electronically Signed   By: Marijo Conception M.D.   On: 08/12/2020 16:47   CT Abdomen Pelvis W Contrast  Result Date: 08/10/2020 CLINICAL DATA:  Epigastric abdominal pain and fever. EXAM: CT ABDOMEN AND PELVIS WITH CONTRAST TECHNIQUE: Multidetector CT imaging of the abdomen and pelvis was performed using the standard protocol following bolus administration of intravenous contrast. CONTRAST:  132mL OMNIPAQUE IOHEXOL 300 MG/ML  SOLN COMPARISON:  None. FINDINGS: Lower chest: The lung bases are clear of an acute process. Minimal  streaky basilar atelectasis or scarring changes. The heart is upper limits of normal in size. No pericardial effusion. The distal esophagus is grossly normal. Hepatobiliary: No hepatic lesions are identified. Mild intrahepatic biliary dilatation is noted. The gallbladder is distended and demonstrates gallbladder wall thickening. There is also mild pericholecystic inflammatory changes. Gas containing gallstones are noted in the gallbladder. The largest measures 2.3 cm. The common bile duct is dilated to a maximum of 15 mm. Findings suspicious for a obstructing distal common bile duct stone. ERCP or MRCP may be helpful for further evaluation and treatment. Pancreas: No mass, inflammation or ductal dilatation. Prominent fatty interstices. Spleen: Normal size.  No focal lesions. Adrenals/Urinary Tract: The adrenal glands and kidneys are unremarkable. No worrisome renal lesions or obstructing ureteral calculi. The bladder is unremarkable. Stomach/Bowel: Surgical changes are noted involving the stomach from gastric bypass surgery. The duodenum, small bowel and colon are grossly normal without oral contrast. No acute inflammatory process, mass lesions or obstructive findings. Colonic diverticulosis without findings for acute diverticulitis. Vascular/Lymphatic: The aorta is normal in caliber. No dissection. The branch vessels are patent. The major venous structures are patent. No mesenteric or retroperitoneal mass or adenopathy. Small scattered lymph nodes are noted. Reproductive: The uterus and left ovary are unremarkable. The right ovary is surgically absent. Other: Periumbilical abdominal wall hernia containing  fat and a small bowel loop. No findings for incarceration or obstruction. Musculoskeletal: No significant bony findings. IMPRESSION: 1. CT findings consistent with acute calculus cholecystitis. There is also common bile duct dilatation and probable distal common bile duct stone. 2. ERCP or MRCP may be helpful for  further evaluation and treatment. 3. Periumbilical abdominal wall hernia containing fat and a small bowel loop. No findings for incarceration or obstruction. 4. Surgical changes from gastric bypass surgery. Electronically Signed   By: Marijo Sanes M.D.   On: 08/10/2020 17:37   MR ABDOMEN MRCP WO CONTRAST  Result Date: 08/11/2020 CLINICAL DATA:  Cholelithiasis with biliary dilatation. Evaluate for CBD stone. EXAM: MRI ABDOMEN WITHOUT CONTRAST  (INCLUDING MRCP) TECHNIQUE: Multiplanar multisequence MR imaging of the abdomen was performed. Heavily T2-weighted images of the biliary and pancreatic ducts were obtained, and three-dimensional MRCP images were rendered by post processing. COMPARISON:  CT scan 08/10/2020. FINDINGS: Lower chest: Dependent atelectasis bilaterally Hepatobiliary: No suspicious focal abnormality within the liver parenchyma. Mild intrahepatic biliary duct dilatation evident. Gallbladder is distended with gallbladder wall irregularity and mild pericholecystic edema. Multiple stones identified in the lumen of the gallbladder measuring in the 2.0-2.5 cm size range. Common bile duct dilated up to 13 mm diameter. 7 mm stone is identified in the distal common bile duct, at the ampulla (well demonstrated on coronal image 23 of series 5). Pancreas: No focal mass lesion. No dilatation of the main duct. No intraparenchymal cyst. No peripancreatic edema. Spleen:  No splenomegaly. No focal mass lesion. Adrenals/Urinary Tract: No adrenal nodule or mass. Kidneys unremarkable. Stomach/Bowel: Stomach is unremarkable. No gastric wall thickening. No evidence of outlet obstruction. Duodenum is normally positioned as is the ligament of Treitz. No small bowel or colonic dilatation within the visualized abdomen. Vascular/Lymphatic: No abdominal aortic aneurysm. There is no gastrohepatic or hepatoduodenal ligament lymphadenopathy. No retroperitoneal or mesenteric lymphadenopathy. Other:  No intraperitoneal free  fluid. Musculoskeletal: No suspicious marrow signal abnormality within the visualized bony anatomy. IMPRESSION: 1. 7 mm stone identified in the distal common bile duct. Associated intra and extrahepatic biliary duct dilatation with common bile duct diameter measuring up to 13 mm. 2. Cholelithiasis with gallbladder wall irregularity and mild pericholecystic edema. Acute cholecystitis not excluded. Electronically Signed   By: Misty Stanley M.D.   On: 08/11/2020 07:12   MR 3D Recon At Scanner  Result Date: 08/11/2020 CLINICAL DATA:  Cholelithiasis with biliary dilatation. Evaluate for CBD stone. EXAM: MRI ABDOMEN WITHOUT CONTRAST  (INCLUDING MRCP) TECHNIQUE: Multiplanar multisequence MR imaging of the abdomen was performed. Heavily T2-weighted images of the biliary and pancreatic ducts were obtained, and three-dimensional MRCP images were rendered by post processing. COMPARISON:  CT scan 08/10/2020. FINDINGS: Lower chest: Dependent atelectasis bilaterally Hepatobiliary: No suspicious focal abnormality within the liver parenchyma. Mild intrahepatic biliary duct dilatation evident. Gallbladder is distended with gallbladder wall irregularity and mild pericholecystic edema. Multiple stones identified in the lumen of the gallbladder measuring in the 2.0-2.5 cm size range. Common bile duct dilated up to 13 mm diameter. 7 mm stone is identified in the distal common bile duct, at the ampulla (well demonstrated on coronal image 23 of series 5). Pancreas: No focal mass lesion. No dilatation of the main duct. No intraparenchymal cyst. No peripancreatic edema. Spleen:  No splenomegaly. No focal mass lesion. Adrenals/Urinary Tract: No adrenal nodule or mass. Kidneys unremarkable. Stomach/Bowel: Stomach is unremarkable. No gastric wall thickening. No evidence of outlet obstruction. Duodenum is normally positioned as is the ligament of Treitz. No  small bowel or colonic dilatation within the visualized abdomen. Vascular/Lymphatic:  No abdominal aortic aneurysm. There is no gastrohepatic or hepatoduodenal ligament lymphadenopathy. No retroperitoneal or mesenteric lymphadenopathy. Other:  No intraperitoneal free fluid. Musculoskeletal: No suspicious marrow signal abnormality within the visualized bony anatomy. IMPRESSION: 1. 7 mm stone identified in the distal common bile duct. Associated intra and extrahepatic biliary duct dilatation with common bile duct diameter measuring up to 13 mm. 2. Cholelithiasis with gallbladder wall irregularity and mild pericholecystic edema. Acute cholecystitis not excluded. Electronically Signed   By: Misty Stanley M.D.   On: 08/11/2020 07:12   DG C-Arm 1-60 Min  Result Date: 08/12/2020 CLINICAL DATA:  Cholelithiasis.  Unsuccessful ERCP. EXAM: DG C-ARM 1-60 MIN CONTRAST:  None. FLUOROSCOPY TIME:  Radiation Exposure Index (if provided by the fluoroscopic device): 135.23 mGy. Number of Acquired Spot Images: 13. COMPARISON:  None. FINDINGS: Fluoroscopic guidance was provided during attempted ERCP. IMPRESSION: Fluoroscopic guidance was provided during attempted ERCP. Electronically Signed   By: Marijo Conception M.D.   On: 08/12/2020 12:41   IR BILIARY DRAIN PLACEMENT WITH CHOLANGIOGRAM  Result Date: 08/12/2020 Narrative & Impression INDICATION: 59 year old female with history of cholelithiasis presenting obstructive choledocholithiasis and acute calculus cholecystitis. The patient has a history of Roux-en-Y gastric bypass, therefore percutaneous biliary drainage is requested due to challenges to access the biliary tree in a retrograde fashion endoscopically.  EXAM: Percutaneous cholangiogram with external biliary drain placement  MEDICATIONS: Patient is an inpatient and currently on intravenous Zosyn which was being administered during the procedure.  ANESTHESIA/SEDATION: Moderate (conscious) sedation was employed during this procedure. A total of Versed 2 mg and Fentanyl 75 mcg was administered  intravenously.  Moderate Sedation Time: 23 minutes. The patient's level of consciousness and vital signs were monitored continuously by radiology nursing throughout the procedure under my direct supervision.  FLUOROSCOPY TIME:  Fluoroscopy Time: 2 minutes 24 seconds (49 mGy).  COMPLICATIONS: None immediate.  PROCEDURE: Informed written consent was obtained from the patient after a thorough discussion of the procedural risks, benefits and alternatives. All questions were addressed. Maximal Sterile Barrier Technique was utilized including caps, mask, sterile gowns, sterile gloves, sterile drape, hand hygiene and skin antiseptic. A timeout was performed prior to the initiation of the procedure.  Preprocedure ultrasound of the right upper quadrant was performed which demonstrated a prominent left-sided bile duct with safe window for percutaneous puncture. The subxiphoid region was prepped and draped in standard fashion. Under sonographic guidance, a 22 gauge Chiba needle was directed to a tertiary biliary radicle in the left lobe. Gentle hand injection of contrast demonstrated position within the biliary tree. Limited cholangiogram demonstrated minimal dilation of the intra and extrahepatic bile ducts with obstruction of the distal common bile duct, compatible with recent MRCP. An 0.018" guidewire was inserted through the indwelling needle and directed to the common bile duct. A 6 French Accustick set was then placed over the guidewire, and the inner transition portions and guidewire removed. Additional contrast injection was performed to confirm location within the biliary tree. An Amplatz wire was then inserted and directed to the distal common bile duct. At this point, the patient experienced significant pain as the guidewire contacted the indwelling distal common bile duct obstructive stone. Therefore attempt to internalize the biliary drain were aborted. Over the Amplatz guidewire, a 10 [place] multipurpose  drainage catheter with the string cut was placed with the distal tip just beyond the confluence of the left and right biliary tree and the radiopaque  marker within the peripheral left intrahepatic duct. Gentle hand injection of contrast demonstrated appropriate position within the biliary tree. The drain was sutured in place with 0 silk suture and a sterile dressing was applied. The drain was connected to bag drainage.  The patient tolerated procedure well was transferred to the floor in stable condition.  IMPRESSION: 1. Obstructive choledocholithiasis in the distal common bile duct. There is mild intra and extra hepatic biliary ductal dilation. 2. Successful placement of an external, 10 French left-sided biliary drain.  Ruthann Cancer, MD  [facility]  [facility]   Electronically Signed   By: Ruthann Cancer MD   On: 08/12/2020 08:05    (Echo, Carotid, EGD, Colonoscopy, ERCP)    Subjective: Patient resting in bed anxious to go to [facility] continues to complain of abdominal pain drain in place  Discharge Exam: Vitals:   08/13/20 0442 08/13/20 1600  BP: (!) 156/95 134/73  Pulse: 79 70  Resp: 16 18  Temp: 97.7 F (36.5 C) 97.9 F (36.6 C)  SpO2: 98% 99%   Vitals:   08/12/20 2207 08/13/20 0010 08/13/20 0442 08/13/20 1600  BP: 127/77 140/80 (!) 156/95 134/73  Pulse: 84 73 79 70  Resp: 16 18 16 18   Temp: 98.6 F (37 C) 97.9 F (36.6 C) 97.7 F (36.5 C) 97.9 F (36.6 C)  TempSrc: Oral Oral Oral Oral  SpO2: 95% 98% 98% 99%  Weight:      Height:        General: Pt is alert, awake, not in acute distress Cardiovascular: RRR, S1/S2 +, no rubs, no gallops Respiratory: CTA bilaterally, no wheezing, no rhonchi Abdominal: Soft, NT, ND, bowel sounds + Extremities: no edema, no cyanosis    The results of significant diagnostics from this hospitalization (including imaging, microbiology, ancillary and laboratory) are listed below for reference.      Microbiology: Recent Results (from the past 240 hour(s))  Culture, blood (Routine X 2) w Reflex to ID Panel     Status: Abnormal   Collection Time: 08/10/20  6:25 PM   Specimen: BLOOD  Result Value Ref Range Status   Specimen Description   Final    BLOOD RIGHT ANTECUBITAL Performed at [facility], [address] [place], [place] [address]    Special Requests   Final    BOTTLES DRAWN AEROBIC AND ANAEROBIC Blood Culture adequate volume Performed at [facility], [address] [place], [place] [address]    Culture  Setup Time   Final    GRAM POSITIVE COCCI IN BOTH AEROBIC AND ANAEROBIC BOTTLES CRITICAL RESULT CALLED TO, READ BACK BY AND VERIFIED WITH: Tillman Sers [facility] 08/11/20 2120 JDW    Culture (A)  Final    STAPHYLOCOCCUS EPIDERMIDIS THE SIGNIFICANCE OF ISOLATING THIS ORGANISM FROM A SINGLE SET OF BLOOD CULTURES WHEN MULTIPLE SETS ARE DRAWN IS UNCERTAIN. PLEASE NOTIFY THE MICROBIOLOGY DEPARTMENT WITHIN ONE WEEK IF SPECIATION AND SENSITIVITIES ARE REQUIRED. Performed at [facility], [place] [address], [place], [place] [address]    Report Status 08/13/2020 FINAL  Final  Blood Culture ID Panel (Reflexed)     Status: Abnormal   Collection Time: 08/10/20  6:25 PM  Result Value Ref Range Status   Enterococcus faecalis NOT DETECTED NOT DETECTED Final   Enterococcus Faecium NOT DETECTED NOT DETECTED Final   Listeria monocytogenes NOT DETECTED NOT DETECTED Final   Staphylococcus species DETECTED (A) NOT DETECTED Final    Comment:  CRITICAL RESULT CALLED TO, READ BACK BY AND VERIFIED WITH: Tillman Sers [facility] 08/11/20 2120 JDW    Staphylococcus aureus (BCID) NOT DETECTED NOT DETECTED Final   Staphylococcus epidermidis DETECTED (A) NOT DETECTED Final    Comment: Methicillin (oxacillin) resistant coagulase negative staphylococcus. Possible blood culture contaminant (unless isolated from more than one blood culture draw or clinical case suggests pathogenicity). No  antibiotic treatment is indicated for blood  culture contaminants. CRITICAL RESULT CALLED TO, READ BACK BY AND VERIFIED WITH: Tillman Sers [facility] 08/11/20 2120 JDW    Staphylococcus lugdunensis NOT DETECTED NOT DETECTED Final   Streptococcus species NOT DETECTED NOT DETECTED Final   Streptococcus agalactiae NOT DETECTED NOT DETECTED Final   Streptococcus pneumoniae NOT DETECTED NOT DETECTED Final   Streptococcus pyogenes NOT DETECTED NOT DETECTED Final   A.calcoaceticus-baumannii NOT DETECTED NOT DETECTED Final   Bacteroides fragilis NOT DETECTED NOT DETECTED Final   Enterobacterales NOT DETECTED NOT DETECTED Final   Enterobacter cloacae complex NOT DETECTED NOT DETECTED Final   Escherichia coli NOT DETECTED NOT DETECTED Final   Klebsiella aerogenes NOT DETECTED NOT DETECTED Final   Klebsiella oxytoca NOT DETECTED NOT DETECTED Final   Klebsiella pneumoniae NOT DETECTED NOT DETECTED Final   Proteus species NOT DETECTED NOT DETECTED Final   Salmonella species NOT DETECTED NOT DETECTED Final   Serratia marcescens NOT DETECTED NOT DETECTED Final   Haemophilus influenzae NOT DETECTED NOT DETECTED Final   Neisseria meningitidis NOT DETECTED NOT DETECTED Final   Pseudomonas aeruginosa NOT DETECTED NOT DETECTED Final   Stenotrophomonas maltophilia NOT DETECTED NOT DETECTED Final   Candida albicans NOT DETECTED NOT DETECTED Final   Candida auris NOT DETECTED NOT DETECTED Final   Candida glabrata NOT DETECTED NOT DETECTED Final   Candida krusei NOT DETECTED NOT DETECTED Final   Candida parapsilosis NOT DETECTED NOT DETECTED Final   Candida tropicalis NOT DETECTED NOT DETECTED Final   Cryptococcus neoformans/gattii NOT DETECTED NOT DETECTED Final   Methicillin resistance mecA/C DETECTED (A) NOT DETECTED Final    Comment: CRITICAL RESULT CALLED TO, READ BACK BY AND VERIFIED WITHTillman Sers [facility] 08/11/20 2120 JDW Performed at [facility], [address], [place], [place] [address]    Culture, blood (Routine X 2) w Reflex to ID Panel     Status: None   Collection Time: 08/10/20  6:45 PM   Specimen: BLOOD  Result Value Ref Range Status   Specimen Description   Final    BLOOD BLOOD RIGHT WRIST Performed at [facility], [address] [place], [place] [address]    Special Requests   Final    BOTTLES DRAWN AEROBIC AND ANAEROBIC Blood Culture results may not be optimal due to an inadequate volume of blood received in culture bottles Performed at [facility], [address] [place], [place] [address]    Culture   Final    NO GROWTH 5 DAYS Performed at [facility], [place] [address], [place], [place] [address]    Report Status 08/15/2020 FINAL  Final  Respiratory Panel by RT PCR (Flu A&B, Covid) - Nasopharyngeal Swab     Status: None   Collection Time: 08/10/20  7:52 PM   Specimen: Nasopharyngeal Swab  Result Value Ref Range Status   SARS Coronavirus 2 by RT PCR NEGATIVE NEGATIVE Final    Comment: (NOTE) SARS-CoV-2 target nucleic acids are NOT DETECTED.  The SARS-CoV-2 RNA is generally detectable in upper respiratoy specimens during the acute  phase of infection. The lowest concentration of SARS-CoV-2 viral copies this assay can detect is 131 copies/mL. A negative result does not preclude SARS-Cov-2 infection and should not be used as the sole basis for treatment or other patient management decisions. A negative result may occur with  improper specimen collection/handling, submission of specimen other than nasopharyngeal swab, presence of viral mutation(s) within the areas targeted by this assay, and inadequate number of viral copies (<131 copies/mL). A negative result must be combined with clinical observations, patient history, and epidemiological information. The expected result is Negative.  Fact Sheet for Patients:  PinkCheek.be  Fact Sheet for Healthcare Providers:   GravelBags.it  This test is no t yet approved or cleared by the [place] FDA and  has been authorized for detection and/or diagnosis of SARS-CoV-2 by FDA under an Emergency Use Authorization (EUA). This EUA will remain  in effect (meaning this test can be used) for the duration of the COVID-19 declaration under Section 564(b)(1) of the Act, 21 U.S.C. section 360bbb-3(b)(1), unless the authorization is terminated or revoked sooner.     Influenza A by PCR NEGATIVE NEGATIVE Final   Influenza B by PCR NEGATIVE NEGATIVE Final    Comment: (NOTE) The Xpert Xpress SARS-CoV-2/FLU/RSV assay is intended as an aid in  the diagnosis of influenza from Nasopharyngeal swab specimens and  should not be used as a sole basis for treatment. Nasal washings and  aspirates are unacceptable for Xpert Xpress SARS-CoV-2/FLU/RSV  testing.  Fact Sheet for Patients: PinkCheek.be  Fact Sheet for Healthcare Providers: GravelBags.it  This test is not yet approved or cleared by the [place] FDA and  has been authorized for detection and/or diagnosis of SARS-CoV-2 by  FDA under an Emergency Use Authorization (EUA). This EUA will remain  in effect (meaning this test can be used) for the duration of the  Covid-19 declaration under Section 564(b)(1) of the Act, 21  U.S.C. section 360bbb-3(b)(1), unless the authorization is  terminated or revoked. Performed at [facility], [address] [place], [place] [address]      Labs: BNP (last 3 results) No results for input(s): BNP in the last 8760 hours. Basic Metabolic Panel: Recent Labs  Lab 08/10/20 1608 08/11/20 0101 08/12/20 0036 08/13/20 0222  NA 137 139 138 139  K 3.8 3.5 3.3* 3.6  CL 98 103 103 101  CO2 25 23 24 25   GLUCOSE 142* 125* 131* 146*  BUN 10 9 8 7   CREATININE 0.42* 0.83 0.68 0.67  CALCIUM 9.0 9.1 8.7* 9.0   Liver Function  Tests: Recent Labs  Lab 08/10/20 1608 08/11/20 0101 08/12/20 0036 08/13/20 0222  AST 177* 133* 68* 44*  ALT 270* 224* 152* 106*  ALKPHOS 506* 476* 401* 360*  BILITOT 7.4* 8.0* 7.6* 7.7*  PROT 7.4 7.0 6.0* 6.4*  ALBUMIN 3.3* 2.8* 2.4* 2.4*   Recent Labs  Lab 08/10/20 1608  LIPASE 23   No results for input(s): AMMONIA in the last 168 hours. CBC: Recent Labs  Lab 08/10/20 1608 08/11/20 0101 08/12/20 0036 08/13/20 0222  WBC 15.7* 27.7* 14.3* 7.7  HGB 13.4 12.5 11.7* 11.8*  HCT 42.4 41.0 38.7 37.9  MCV 97.7 97.9 98.2 96.4  PLT 467* 400 327 390   Cardiac Enzymes: No results for input(s): CKTOTAL, CKMB, CKMBINDEX, TROPONINI in the last 168 hours. BNP: Invalid input(s): POCBNP CBG: Recent Labs  Lab 08/12/20 0807 08/12/20 1618 08/13/20 0429 08/13/20 0739 08/13/20 1615  GLUCAP 109* 152* 136* 142*  126*   D-Dimer No results for input(s): DDIMER in the last 72 hours. Hgb A1c No results for input(s): HGBA1C in the last 72 hours. Lipid Profile No results for input(s): CHOL, HDL, LDLCALC, TRIG, CHOLHDL, LDLDIRECT in the last 72 hours. Thyroid function studies No results for input(s): TSH, T4TOTAL, T3FREE, THYROIDAB in the last 72 hours.  Invalid input(s): FREET3 Anemia work up No results for input(s): VITAMINB12, FOLATE, FERRITIN, TIBC, IRON, RETICCTPCT in the last 72 hours. Urinalysis    Component Value Date/Time   COLORURINE AMBER (A) 08/10/2020 1608   APPEARANCEUR CLEAR 08/10/2020 1608   LABSPEC 1.015 08/10/2020 1608   PHURINE 7.0 08/10/2020 1608   GLUCOSEU 100 (A) 08/10/2020 1608   HGBUR NEGATIVE 08/10/2020 1608   BILIRUBINUR MODERATE (A) 08/10/2020 1608   KETONESUR NEGATIVE 08/10/2020 1608   PROTEINUR NEGATIVE 08/10/2020 1608   UROBILINOGEN 0.2 02/10/2010 2014   NITRITE NEGATIVE 08/10/2020 1608   LEUKOCYTESUR NEGATIVE 08/10/2020 1608   Sepsis Labs Invalid input(s): PROCALCITONIN,  WBC,  LACTICIDVEN Microbiology Recent Results (from the past 240  hour(s))  Culture, blood (Routine X 2) w Reflex to ID Panel     Status: Abnormal   Collection Time: 08/10/20  6:25 PM   Specimen: BLOOD  Result Value Ref Range Status   Specimen Description   Final    BLOOD RIGHT ANTECUBITAL Performed at [facility], [address] [place], [place] [address]    Special Requests   Final    BOTTLES DRAWN AEROBIC AND ANAEROBIC Blood Culture adequate volume Performed at [facility], [address] [place], [place] [address]    Culture  Setup Time   Final    GRAM POSITIVE COCCI IN BOTH AEROBIC AND ANAEROBIC BOTTLES CRITICAL RESULT CALLED TO, READ BACK BY AND VERIFIED WITH: Tillman Sers [facility] 08/11/20 2120 JDW    Culture (A)  Final    STAPHYLOCOCCUS EPIDERMIDIS THE SIGNIFICANCE OF ISOLATING THIS ORGANISM FROM A SINGLE SET OF BLOOD CULTURES WHEN MULTIPLE SETS ARE DRAWN IS UNCERTAIN. PLEASE NOTIFY THE MICROBIOLOGY DEPARTMENT WITHIN ONE WEEK IF SPECIATION AND SENSITIVITIES ARE REQUIRED. Performed at [facility], [place] [address], [place], [place] [address]    Report Status 08/13/2020 FINAL  Final  Blood Culture ID Panel (Reflexed)     Status: Abnormal   Collection Time: 08/10/20  6:25 PM  Result Value Ref Range Status   Enterococcus faecalis NOT DETECTED NOT DETECTED Final   Enterococcus Faecium NOT DETECTED NOT DETECTED Final   Listeria monocytogenes NOT DETECTED NOT DETECTED Final   Staphylococcus species DETECTED (A) NOT DETECTED Final    Comment: CRITICAL RESULT CALLED TO, READ BACK BY AND VERIFIED WITH: Tillman Sers [facility] 08/11/20 2120 JDW    Staphylococcus aureus (BCID) NOT DETECTED NOT DETECTED Final   Staphylococcus epidermidis DETECTED (A) NOT DETECTED Final    Comment: Methicillin (oxacillin) resistant coagulase negative staphylococcus. Possible blood culture contaminant (unless isolated from more than one blood culture draw or clinical case suggests pathogenicity). No antibiotic treatment is indicated for blood  culture  contaminants. CRITICAL RESULT CALLED TO, READ BACK BY AND VERIFIED WITH: Tillman Sers [facility] 08/11/20 2120 JDW    Staphylococcus lugdunensis NOT DETECTED NOT DETECTED Final   Streptococcus species NOT DETECTED NOT DETECTED Final   Streptococcus agalactiae NOT DETECTED NOT DETECTED Final   Streptococcus pneumoniae NOT DETECTED NOT DETECTED Final   Streptococcus pyogenes NOT DETECTED NOT DETECTED Final   A.calcoaceticus-baumannii NOT DETECTED NOT DETECTED Final   Bacteroides fragilis  NOT DETECTED NOT DETECTED Final   Enterobacterales NOT DETECTED NOT DETECTED Final   Enterobacter cloacae complex NOT DETECTED NOT DETECTED Final   Escherichia coli NOT DETECTED NOT DETECTED Final   Klebsiella aerogenes NOT DETECTED NOT DETECTED Final   Klebsiella oxytoca NOT DETECTED NOT DETECTED Final   Klebsiella pneumoniae NOT DETECTED NOT DETECTED Final   Proteus species NOT DETECTED NOT DETECTED Final   Salmonella species NOT DETECTED NOT DETECTED Final   Serratia marcescens NOT DETECTED NOT DETECTED Final   Haemophilus influenzae NOT DETECTED NOT DETECTED Final   Neisseria meningitidis NOT DETECTED NOT DETECTED Final   Pseudomonas aeruginosa NOT DETECTED NOT DETECTED Final   Stenotrophomonas maltophilia NOT DETECTED NOT DETECTED Final   Candida albicans NOT DETECTED NOT DETECTED Final   Candida auris NOT DETECTED NOT DETECTED Final   Candida glabrata NOT DETECTED NOT DETECTED Final   Candida krusei NOT DETECTED NOT DETECTED Final   Candida parapsilosis NOT DETECTED NOT DETECTED Final   Candida tropicalis NOT DETECTED NOT DETECTED Final   Cryptococcus neoformans/gattii NOT DETECTED NOT DETECTED Final   Methicillin resistance mecA/C DETECTED (A) NOT DETECTED Final    Comment: CRITICAL RESULT CALLED TO, READ BACK BY AND VERIFIED WITHTillman Sers [facility] 08/11/20 2120 JDW Performed at [facility], [address] [place], [place] [address]   Culture, blood (Routine X 2) w Reflex to ID Panel     Status:  None   Collection Time: 08/10/20  6:45 PM   Specimen: BLOOD  Result Value Ref Range Status   Specimen Description   Final    BLOOD BLOOD RIGHT WRIST Performed at [facility], [address] [place], [place] [address]    Special Requests   Final    BOTTLES DRAWN AEROBIC AND ANAEROBIC Blood Culture results may not be optimal due to an inadequate volume of blood received in culture bottles Performed at [facility], [address] [place], [place] [address]    Culture   Final    NO GROWTH 5 DAYS Performed at [facility], [place] [address], [place], [place] [address]    Report Status 08/15/2020 FINAL  Final  Respiratory Panel by RT PCR (Flu A&B, Covid) - Nasopharyngeal Swab     Status: None   Collection Time: 08/10/20  7:52 PM   Specimen: Nasopharyngeal Swab  Result Value Ref Range Status   SARS Coronavirus 2 by RT PCR NEGATIVE NEGATIVE Final    Comment: (NOTE) SARS-CoV-2 target nucleic acids are NOT DETECTED.  The SARS-CoV-2 RNA is generally detectable in upper respiratoy specimens during the acute phase of infection. The lowest concentration of SARS-CoV-2 viral copies this assay can detect is 131 copies/mL. A negative result does not preclude SARS-Cov-2 infection and should not be used as the sole basis for treatment or other patient management decisions. A negative result may occur with  improper specimen collection/handling, submission of specimen other than nasopharyngeal swab, presence of viral mutation(s) within the areas targeted by this assay, and inadequate number of viral copies (<131 copies/mL). A negative result must be combined with clinical observations, patient history, and epidemiological information. The expected result is Negative.  Fact Sheet for Patients:  PinkCheek.be  Fact Sheet for Healthcare Providers:  GravelBags.it  This test is no t yet approved or cleared by the  [place] FDA and  has been authorized for detection and/or diagnosis of SARS-CoV-2 by FDA under an Emergency Use Authorization (EUA). This EUA will  remain  in effect (meaning this test can be used) for the duration of the COVID-19 declaration under Section 564(b)(1) of the Act, 21 U.S.C. section 360bbb-3(b)(1), unless the authorization is terminated or revoked sooner.     Influenza A by PCR NEGATIVE NEGATIVE Final   Influenza B by PCR NEGATIVE NEGATIVE Final    Comment: (NOTE) The Xpert Xpress SARS-CoV-2/FLU/RSV assay is intended as an aid in  the diagnosis of influenza from Nasopharyngeal swab specimens and  should not be used as a sole basis for treatment. Nasal washings and  aspirates are unacceptable for Xpert Xpress SARS-CoV-2/FLU/RSV  testing.  Fact Sheet for Patients: PinkCheek.be  Fact Sheet for Healthcare Providers: GravelBags.it  This test is not yet approved or cleared by the [place] FDA and  has been authorized for detection and/or diagnosis of SARS-CoV-2 by  FDA under an Emergency Use Authorization (EUA). This EUA will remain  in effect (meaning this test can be used) for the duration of the  Covid-19 declaration under Section 564(b)(1) of the Act, 21  U.S.C. section 360bbb-3(b)(1), unless the authorization is  terminated or revoked. Performed at [facility], [address], [place], [place] [address]      Time coordinating discharge:  39 minutes  SIGNED:   Georgette Shell, MD  [facility] 08/16/2020, 4:52 PM

## 2020-08-16 NOTE — Progress Notes (Signed)
Pt still out of facility at this time. May return on 08/16/20. 08/15/2020 @ 1915 Cyndi Bender, RN

## 2020-08-16 NOTE — Progress Notes (Signed)
dw patients daughter.  As of today no procedures have been done.  Daughter thinks they might do some procedure tomorrow which is 08/17/2020.  She is not aware of when she will be back to [facility].

## 2020-08-16 NOTE — Progress Notes (Signed)
Patient is being discharged

## 2020-08-19 ENCOUNTER — Encounter: Payer: Self-pay | Admitting: Gastroenterology

## 2020-09-29 ENCOUNTER — Other Ambulatory Visit: Payer: Self-pay | Admitting: Gastroenterology

## 2020-10-06 ENCOUNTER — Other Ambulatory Visit: Payer: Self-pay

## 2020-11-04 DIAGNOSIS — K831 Obstruction of bile duct: Secondary | ICD-10-CM | POA: Insufficient documentation

## 2020-12-22 ENCOUNTER — Telehealth: Payer: Self-pay | Admitting: Cardiology

## 2020-12-22 MED ORDER — METOPROLOL SUCCINATE ER 100 MG PO TB24: 100.0000 mg | ORAL_TABLET | Freq: Every day | ORAL | 0 refills | Status: DC

## 2020-12-22 NOTE — Telephone Encounter (Signed)
*  STAT* If patient is at the pharmacy, call can be transferred to refill team.   1. Which medications need to be refilled? (please list name of each medication and dose if known)  metoprolol succinate (TOPROL-XL) 100 MG 24 hr tablet [[id number]] ENDED  2. Which pharmacy/location (including street and city if local pharmacy) is medication to be sent to? [facility] [place]   3. Do they need a 30 day or 90 day supply?  90 day  Pt was a recent pt of [facility] and is wanting to be seen closer to home so will be f/u with Dr. Stanford Breed in the [place] office- nxt avl apt isn't till May and she's not having any problems

## 2020-12-22 NOTE — Telephone Encounter (Signed)
Refilled Toprol XL 100 mg qd #90 RF:0 to [facility]

## 2021-01-03 ENCOUNTER — Other Ambulatory Visit: Payer: Self-pay | Admitting: Family Medicine

## 2021-01-03 DIAGNOSIS — Z1231 Encounter for screening mammogram for malignant neoplasm of breast: Secondary | ICD-10-CM

## 2021-01-27 ENCOUNTER — Other Ambulatory Visit (HOSPITAL_COMMUNITY)
Admission: RE | Admit: 2021-01-27 | Discharge: 2021-01-27 | Disposition: A | Discharge status: Home / Self Care | Payer: Managed Care, Other (non HMO) | Source: Ambulatory Visit | Attending: Obstetrics and Gynecology | Admitting: Obstetrics and Gynecology

## 2021-01-27 ENCOUNTER — Encounter: Payer: Self-pay | Admitting: Obstetrics and Gynecology

## 2021-01-27 ENCOUNTER — Ambulatory Visit (INDEPENDENT_AMBULATORY_CARE_PROVIDER_SITE_OTHER): Payer: Managed Care, Other (non HMO) | Admitting: Obstetrics and Gynecology

## 2021-01-27 ENCOUNTER — Other Ambulatory Visit: Payer: Self-pay | Admitting: Nurse Practitioner

## 2021-01-27 ENCOUNTER — Other Ambulatory Visit: Payer: Self-pay

## 2021-01-27 VITALS — BP 134/74 | HR 62 | Resp 16 | Ht 64.0 in | Wt 314.0 lb

## 2021-01-27 DIAGNOSIS — N9089 Other specified noninflammatory disorders of vulva and perineum: Secondary | ICD-10-CM

## 2021-01-27 DIAGNOSIS — Z124 Encounter for screening for malignant neoplasm of cervix: Secondary | ICD-10-CM | POA: Diagnosis not present

## 2021-01-27 DIAGNOSIS — Z01419 Encounter for gynecological examination (general) (routine) without abnormal findings: Secondary | ICD-10-CM | POA: Insufficient documentation

## 2021-01-27 DIAGNOSIS — Z1231 Encounter for screening mammogram for malignant neoplasm of breast: Secondary | ICD-10-CM

## 2021-01-27 DIAGNOSIS — B373 Candidiasis of vulva and vagina: Secondary | ICD-10-CM | POA: Diagnosis not present

## 2021-01-27 DIAGNOSIS — R32 Unspecified urinary incontinence: Secondary | ICD-10-CM

## 2021-01-27 DIAGNOSIS — R9389 Abnormal findings on diagnostic imaging of other specified body structures: Secondary | ICD-10-CM | POA: Diagnosis not present

## 2021-01-27 DIAGNOSIS — B372 Candidiasis of skin and nail: Secondary | ICD-10-CM

## 2021-01-27 NOTE — Progress Notes (Signed)
Mammogram schedule next month Colonoscopy UTD

## 2021-01-27 NOTE — Progress Notes (Signed)
u   GYNECOLOGY ANNUAL PREVENTATIVE CARE ENCOUNTER NOTE  Subjective:   Meghan Macdonald is a 60 y.o. G65P1001 female here for a annual gynecologic exam. Current complaints: needs pap. Reports h/o thickened endometrium noted on CT done while in hospital last fall, needs to f/u on that.   LMP 6 years ago, no bleeding since.   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 No issues with uterus itself.  Also has occasional irritation from urinary incontinence, well controlled with using correct pads, staying dry and using nystatin/triamcinolone.  Has not seen gynecologist for this issue, managed by PCP.  Denies abnormal vaginal bleeding, discharge, pelvic pain, problems with intercourse or other gynecologic concerns. Declines STI screen.   Gynecologic History Patient's last menstrual period was 11/08/2014. Contraception: post menopausal status Last Pap: 2013. Results: normal per patient Last mammogram: 2020. Results: Birads 1 DEXA: has had  Obstetric History OB History  Gravida Para Term Preterm AB Living  1 1 1     1   SAB IAB Ectopic Multiple Live Births          1    # Outcome Date GA Lbr Len/2nd Weight Sex Delivery Anes PTL Lv  1 Term      Vag-Spont       Past Medical History:  Diagnosis Date   Anxiety    Arthritis    knees   Asthma    exertion or exteme cold.   Back pain    Carcinoid tumor of appendix    Complication of anesthesia    SOB after second surgery 4 days after gastric bypass   Depression    Dysrhythmia    idiopathic arthy; had plapitations since a child. On metoprolol for 9 years;   Essential hypertension, benign    GERD (gastroesophageal reflux disease)    Headache    History of kidney stones    History of migraine headaches    IBS (irritable bowel  syndrome)    Insomnia    Iron deficiency anemia    Vitamin B deficiency     Past Surgical History:  Procedure Laterality Date   APPENDECTOMY  2001   carcinoid tumor, found during gastric bypass.   BIOPSY  08/12/2020   Procedure: BIOPSY;  Surgeon: Rush Landmark Telford Nab., MD;  Location: [facility] ENDOSCOPY;  Service: Gastroenterology;;   BREAST BIOPSY Right    COLONOSCOPY WITH ESOPHAGOGASTRODUODENOSCOPY (EGD)  2008   Dr. Collene Mares: s/p right hemicolectomy, sigmoid colon diverticulosis, otherwise unremarkable. EGD unremarkable, s/p gastric bypass, No Barrett's   COLONOSCOPY WITH PROPOFOL N/A 11/10/2016   Procedure: COLONOSCOPY WITH PROPOFOL;  Surgeon: Daneil Dolin, MD;  [facility];  Service: Endoscopy;  Laterality: N/A;  115   CYSTOSCOPY     ENTEROSCOPY N/A 08/12/2020   Procedure: ENTEROSCOPY;  Surgeon: Rush Landmark Telford Nab., MD;  Location: [facility];  Service: Gastroenterology;  Laterality: N/A;   ERCP  08/12/2020   ERCP N/A 08/12/2020   Procedure: ENDOSCOPIC RETROGRADE CHOLANGIOPANCREATOGRAPHY (ERCP);  Surgeon: Irving Copas., MD;  Location: [facility];  Service: Gastroenterology;  Laterality: N/A;   ESOPHAGOGASTRODUODENOSCOPY (EGD) WITH PROPOFOL N/A 11/10/2016   Dr. 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 Esophagus  dilated per history of dysphagia. Colonoscopy showed status post right hemicolectomy, scattered diverticula, 2 polyps removed from the rectum, one tubular adenoma. Next colonoscopy 5 years, January 2023   GASTRIC BYPASS  2001   incidental finding of carcinoid tumor of appendix at time of surgery with appendectomy   IR BILIARY DRAIN PLACEMENT WITH CHOLANGIOGRAM  08/11/2020   MALONEY DILATION N/A 11/10/2016   Procedure: Venia Minks DILATION;  Surgeon: Daneil Dolin, MD;  [facility];  Service: Endoscopy;   Laterality: N/A;   REPLACEMENT TOTAL KNEE Left 2006   Rhinologic surgery     RIGHT OOPHORECTOMY     removal of mesentary and part of colon and uterus with removal of appendix; 4 days after gastric bypass   SUBMUCOSAL TATTOO INJECTION  08/12/2020   Procedure: SUBMUCOSAL TATTOO INJECTION;  Surgeon: Irving Copas., MD;  Location: [facility];  Service: Gastroenterology;;   TONSILLECTOMY AND ADENOIDECTOMY      Current Outpatient Medications on File Prior to Visit  Medication Sig Dispense Refill   albuterol (PROVENTIL HFA;VENTOLIN HFA) 108 (90 BASE) MCG/ACT inhaler Inhale 1-2 puffs into the lungs every 6 (six) hours as needed for wheezing or shortness of breath.     cholecalciferol (VITAMIN D) 1000 units tablet Take 1,000 Units by mouth daily.     DULoxetine (CYMBALTA) 60 MG capsule Take 60 mg by mouth 2 (two) times daily.      fexofenadine-pseudoephedrine (ALLEGRA-D 24) 180-240 MG 24 hr tablet Take 1 tablet by mouth daily as needed (for allergies).     fluticasone (FLONASE) 50 MCG/ACT nasal spray Place 1 spray into both nostrils daily as needed for allergies or rhinitis.      metoprolol succinate (TOPROL-XL) 100 MG 24 hr tablet Take 1 tablet (100 mg total) by mouth daily. Take with or immediately following a meal. 90 tablet 0   Multiple Vitamins-Minerals (BARIATRIC MULTIVITAMINS/IRON) CAPS Take 1 capsule by mouth in the morning, at noon, and at bedtime.     RABEprazole (ACIPHEX) 20 MG tablet TAKE 1 TABLET BY MOUTH ONCE DAILY BEFORE BREAKFAST 90 tablet 0   vitamin B-12 (CYANOCOBALAMIN) 50 MCG tablet Take 50 mcg by mouth daily.     zolpidem (AMBIEN) 5 MG tablet Take 5 mg by mouth at bedtime as needed.     No current facility-administered medications on file prior to visit.    Allergies  Allergen Reactions   Azithromycin Other (See Comments)    Upset stomach    Social History   Socioeconomic History   Marital status: Divorced    Spouse name: Not on file    Number of children: Not on file   Years of education: 12+   Highest education level: Not on file  Occupational History    Employer: [employer]  Tobacco Use   Smoking status: Never Smoker   Smokeless tobacco: Never Used  Vaping Use   Vaping Use: Never used  Substance and Sexual Activity   Alcohol use: Yes    Alcohol/week: 2.0 standard drinks    Types: 2 Glasses of wine per week    Comment: Regular alcohol intake, 5 days a week. Quit December 2017   Drug use: No   Sexual activity: Not Currently    Birth control/protection: Post-menopausal  Other Topics Concern   Not on file  Social History Narrative   Not on file   Social Determinants of Health   Financial Resource Strain: Not on file  Food Insecurity: Not on file  Transportation Needs: Not  on file  Physical Activity: Not on file  Stress: Not on file  Social Connections: Not on file  Intimate Partner Violence: Not on file    Family History  Problem Relation Age of Onset   Asthma Mother    Hyperlipidemia Mother    Thyroid disease Mother    Hyperlipidemia Father    Lung cancer Father    Colon cancer Maternal Grandmother 80   Breast cancer Neg Hx    The following portions of the patient's history were reviewed and updated as appropriate: allergies, current medications, past family history, past medical history, past social history, past surgical history and problem list.  Review of Systems Pertinent items are noted in HPI.   Objective:  BP 134/74    Pulse 62    Resp 16    Ht 5\' 4"  (1.626 m)    Wt (!) 314 lb (142.4 kg)    LMP 11/08/2014 Comment: no periods   BMI 53.90 kg/m  CONSTITUTIONAL: Well-developed, well-nourished female in no acute distress.  HENT:  Normocephalic, atraumatic, External right and left ear normal. Oropharynx is clear and moist EYES: Conjunctivae and EOM are normal. Pupils are equal, round, and reactive to light. No scleral icterus.  NECK: Normal range of motion, supple,  no masses.  Normal thyroid.  SKIN: Skin is warm and dry. No rash noted. Not diaphoretic. No erythema. No pallor. NEUROLOGIC: Alert and oriented to person, place, and time. Normal reflexes, muscle tone coordination. No cranial nerve deficit noted. PSYCHIATRIC: Normal mood and affect. Normal behavior. Normal judgment and thought content. CARDIOVASCULAR: Normal heart rate noted RESPIRATORY: Effort normal, no problems with respiration noted. BREASTS: Symmetric in size. No masses, skin changes, nipple drainage, or lymphadenopathy. ABDOMEN: Soft, no distention noted.  No tenderness, rebound or guarding.  PELVIC: somewhat obliterated external female anatomy, no labia minora/majora, erythematous area surrounding vulva and extending to the groin area with definite margin, appearance of yeast or chronic irritation, normal appearing vaginal mucosa and cervix.  No abnormal discharge noted.  Pap smear obtained. Pelvic cultures obtained. Normal uterine size, no other palpable masses, no uterine or adnexal tenderness. MUSCULOSKELETAL: Normal range of motion. No tenderness.  No cyanosis, clubbing, or edema.  2+ distal pulses.  Exam done with chaperone present.   Assessment and Plan:   1. Well woman exam - Cytology - PAP( [place]) - Cervicovaginal ancillary only( [place])  2. Cervical cancer screening  3. Encounter for screening mammogram for malignant neoplasm of breast  4. Thickened endometrium - Recommend [place] and return for endometrial biopsy - she is agreeable - reviewed may not need if lining < 4 mm - US PELVIC COMPLETE WITH TRANSVAGINAL; Future  5. Urinary incontinence, unspecified type - has tried meds before and did not like side effects - wants to try again to see if there are other treatments that would help - Ambulatory referral to Urogynecology  6. Vulvar irritation - Concern that chronic irritation has caused obliteration of anatomy - recommend biopsy to confirm diagnosis of  only yeast - she is agreeable  7. Yeast infection of the skin Cont trimacinolone/nystatin for now   Will follow up results of pap smear and manage accordingly. Encouraged improvement in diet and exercise.  COVID vaccine UTD Declines STI screen. Mammogram scheduled Referral for colonoscopy n/a DEXA not due based on -UTD  Routine preventative health maintenance measures emphasized. Please refer to After Visit Summary for other counseling recommendations.     Feliz Beam, MD, [place]  for [employer] farm manager)

## 2021-01-28 LAB — CYTOLOGY - PAP
Comment: NEGATIVE
Diagnosis: NEGATIVE
High risk HPV: NEGATIVE

## 2021-01-28 LAB — CERVICOVAGINAL ANCILLARY ONLY
Bacterial Vaginitis (gardnerella): NEGATIVE
Candida Glabrata: NEGATIVE
Candida Vaginitis: POSITIVE — AB
Comment: NEGATIVE
Comment: NEGATIVE
Comment: NEGATIVE

## 2021-01-31 ENCOUNTER — Other Ambulatory Visit: Payer: Self-pay

## 2021-01-31 ENCOUNTER — Ambulatory Visit (INDEPENDENT_AMBULATORY_CARE_PROVIDER_SITE_OTHER): Payer: Managed Care, Other (non HMO)

## 2021-01-31 DIAGNOSIS — Z780 Asymptomatic menopausal state: Secondary | ICD-10-CM

## 2021-01-31 DIAGNOSIS — R9389 Abnormal findings on diagnostic imaging of other specified body structures: Secondary | ICD-10-CM | POA: Diagnosis not present

## 2021-01-31 MED ORDER — FLUCONAZOLE 150 MG PO TABS: 150.0000 mg | ORAL_TABLET | Freq: Once | ORAL | 0 refills | Status: AC

## 2021-01-31 NOTE — Addendum Note (Signed)
Addended by: Vivien Rota on: 01/31/2021 09:38 PM   Modules accepted: Orders

## 2021-02-23 ENCOUNTER — Ambulatory Visit: Payer: Managed Care, Other (non HMO)

## 2021-02-28 ENCOUNTER — Other Ambulatory Visit: Payer: Managed Care, Other (non HMO) | Admitting: Obstetrics & Gynecology

## 2021-03-01 NOTE — Progress Notes (Signed)
HPI: FU SVT.  Previously followed by Dr. Bronson Ing.  Nuclear study December 2014 showed soft tissue attenuation but no ischemia.  Gated ejection fraction 72%.  Based on previous notes patient has a history of SVT.  She has been maintained on beta-blockade.  Also with history of hypertension.  Since last seen there is mild dyspnea on exertion but no orthopnea, PND, pedal edema, exertional chest pain or syncope.  Her palpitations/SVT are well controlled with metoprolol.  Current Outpatient Medications  Medication Sig Dispense Refill  . albuterol (PROVENTIL HFA;VENTOLIN HFA) 108 (90 BASE) MCG/ACT inhaler Inhale 1-2 puffs into the lungs every 6 (six) hours as needed for wheezing or shortness of breath.    . cholecalciferol (VITAMIN D) 1000 units tablet Take 1,000 Units by mouth daily.    . DULoxetine (CYMBALTA) 60 MG capsule Take 60 mg by mouth daily.    . fexofenadine-pseudoephedrine (ALLEGRA-D 24) 180-240 MG 24 hr tablet Take 1 tablet by mouth daily as needed (for allergies).    . fluticasone (FLONASE) 50 MCG/ACT nasal spray Place 1 spray into both nostrils daily as needed for allergies or rhinitis.     . metoprolol succinate (TOPROL-XL) 100 MG 24 hr tablet Take 1 tablet (100 mg total) by mouth daily. Take with or immediately following a meal. 90 tablet 0  . Multiple Vitamins-Minerals (BARIATRIC MULTIVITAMINS/IRON) CAPS Take 1 capsule by mouth in the morning, at noon, and at bedtime.    . RABEprazole (ACIPHEX) 20 MG tablet TAKE 1 TABLET BY MOUTH ONCE DAILY BEFORE BREAKFAST 90 tablet 0  . vitamin B-12 (CYANOCOBALAMIN) 50 MCG tablet Take 50 mcg by mouth daily.    Marland Kitchen zolpidem (AMBIEN) 5 MG tablet Take 5 mg by mouth at bedtime as needed for sleep.     No current facility-administered medications for this visit.     Past Medical History:  Diagnosis Date  . Anxiety   . Arthritis    knees  . Asthma    exertion or exteme cold.  . Back pain   . Carcinoid tumor of appendix   . Complication  of anesthesia    SOB after second surgery 4 days after gastric bypass  . Depression   . Dysrhythmia    idiopathic arthy; had plapitations since a child. On metoprolol for 9 years;  . Essential hypertension, benign   . GERD (gastroesophageal reflux disease)   . Headache   . History of kidney stones   . History of migraine headaches   . IBS (irritable bowel syndrome)   . Insomnia   . Iron deficiency anemia   . Vitamin B deficiency     Past Surgical History:  Procedure Laterality Date  . APPENDECTOMY  2001   carcinoid tumor, found during gastric bypass.  Marland Kitchen BIOPSY  08/12/2020   Procedure: BIOPSY;  Surgeon: Rush Landmark Telford Nab., MD;  Location: [facility];  Service: Gastroenterology;;  . BREAST BIOPSY Right   . COLONOSCOPY WITH ESOPHAGOGASTRODUODENOSCOPY (EGD)  2008   Dr. Collene Mares: s/p right hemicolectomy, sigmoid colon diverticulosis, otherwise unremarkable. EGD unremarkable, s/p gastric bypass, No Barrett's  . COLONOSCOPY WITH PROPOFOL N/A 11/10/2016   Procedure: COLONOSCOPY WITH PROPOFOL;  Surgeon: Daneil Dolin, MD;  [facility];  Service: Endoscopy;  Laterality: N/A;  115  . CYSTOSCOPY    . ENTEROSCOPY N/A 08/12/2020   Procedure: ENTEROSCOPY;  Surgeon: Mansouraty, Telford Nab., MD;  Location: [facility];  Service: Gastroenterology;  Laterality: N/A;  . ERCP  08/12/2020  . ERCP  N/A 08/12/2020   Procedure: ENDOSCOPIC RETROGRADE CHOLANGIOPANCREATOGRAPHY (ERCP);  Surgeon: Irving Copas., MD;  Location: [employer];  Service: Gastroenterology;  Laterality: N/A;  . ESOPHAGOGASTRODUODENOSCOPY (EGD) WITH PROPOFOL N/A 11/10/2016   Dr. 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 Esophagus dilated per history of dysphagia. Colonoscopy showed status post right hemicolectomy, scattered diverticula, 2 polyps removed from the rectum, one tubular  adenoma. Next colonoscopy 5 years, January 2023  . GASTRIC BYPASS  2001   incidental finding of carcinoid tumor of appendix at time of surgery with appendectomy  . IR BILIARY DRAIN PLACEMENT WITH CHOLANGIOGRAM  08/11/2020  . MALONEY DILATION N/A 11/10/2016   Procedure: Venia Minks DILATION;  Surgeon: Daneil Dolin, MD;  [facility];  Service: Endoscopy;  Laterality: N/A;  . REPLACEMENT TOTAL KNEE Left 2006  . Rhinologic surgery    . RIGHT OOPHORECTOMY     removal of mesentary and part of colon and uterus with removal of appendix; 4 days after gastric bypass  . SUBMUCOSAL TATTOO INJECTION  08/12/2020   Procedure: SUBMUCOSAL TATTOO INJECTION;  Surgeon: Irving Copas., MD;  Location: [place];  Service: Gastroenterology;;  . TONSILLECTOMY AND ADENOIDECTOMY      Social History   Socioeconomic History  . Marital status: Divorced    Spouse name: Not on file  . Number of children: Not on file  . Years of education: 12+  . Highest education level: Not on file  Occupational History    Employer: [employer]  Tobacco Use  . Smoking status: Never Smoker  . Smokeless tobacco: Never Used  Vaping Use  . Vaping Use: Never used  Substance and Sexual Activity  . Alcohol use: Yes    Alcohol/week: 2.0 standard drinks    Types: 2 Glasses of wine per week    Comment: Regular alcohol intake, 5 days a week. Quit December 2017  . Drug use: No  . Sexual activity: Not Currently    Birth control/protection: Post-menopausal  Other Topics Concern  . Not on file  Social History Narrative  . Not on file   Social Determinants of Health   Financial Resource Strain: Not on file  Food Insecurity: Not on file  Transportation Needs: Not on file  Physical Activity: Not on file  Stress: Not on file  Social Connections: Not on file  Intimate Partner Violence: Not on file    Family History  Problem Relation Age of Onset  . Asthma Mother   . Hyperlipidemia Mother   .  Thyroid disease Mother   . Hyperlipidemia Father   . Lung cancer Father   . Colon cancer Maternal Grandmother 54  . Breast cancer Neg Hx     ROS: no fevers or chills, productive cough, hemoptysis, dysphasia, odynophagia, melena, hematochezia, dysuria, hematuria, rash, seizure activity, orthopnea, PND, pedal edema, claudication. Remaining systems are negative.  Physical Exam: Well-developed obese in no acute distress.  Skin is warm and dry.  HEENT is normal.  Neck is supple.  Chest is clear to auscultation with normal expansion.  Cardiovascular exam is regular rate and rhythm.  Abdominal exam nontender or distended. No masses palpated. Extremities show no edema. neuro grossly intact  A/P  1 supraventricular tachycardia-no recurrences.  Continue beta-blocker.  Can consider referral for ablation in the future if she has more frequent episodes.  We will arrange echocardiogram to assess LV function.  2 hypertension-blood pressure controlled.  Continue present medications and follow-up.  3 morbid obesity-we discussed the importance of weight loss.  Kirk Ruths, MD

## 2021-03-09 ENCOUNTER — Ambulatory Visit (INDEPENDENT_AMBULATORY_CARE_PROVIDER_SITE_OTHER): Payer: Managed Care, Other (non HMO) | Admitting: Cardiology

## 2021-03-09 ENCOUNTER — Encounter: Payer: Self-pay | Admitting: Cardiology

## 2021-03-09 ENCOUNTER — Other Ambulatory Visit: Payer: Self-pay

## 2021-03-09 VITALS — BP 132/81 | HR 74 | Ht 64.0 in | Wt 317.4 lb

## 2021-03-09 DIAGNOSIS — I471 Supraventricular tachycardia: Secondary | ICD-10-CM | POA: Diagnosis not present

## 2021-03-09 DIAGNOSIS — I10 Essential (primary) hypertension: Secondary | ICD-10-CM | POA: Diagnosis not present

## 2021-03-09 MED ORDER — METOPROLOL SUCCINATE ER 100 MG PO TB24: 100.0000 mg | ORAL_TABLET | Freq: Every day | ORAL | 0 refills | Status: DC

## 2021-03-09 NOTE — Patient Instructions (Addendum)
Your physician has requested that you have an echocardiogram. Echocardiography is a painless test that uses sound waves to create images of your heart. It provides your doctor with information about the size and shape of your heart and how well your heart's chambers and valves are working. This procedure takes approximately one hour. There are no restrictions for this procedure.[place]     Follow-Up: At [facility], you and your health needs are our priority.  As part of our continuing mission to provide you with exceptional heart care, we have created designated Provider Care Teams.  These Care Teams include your primary Cardiologist (physician) and Advanced Practice Providers (APPs -  Physician Assistants and Nurse Practitioners) who all work together to provide you with the care you need, when you need it.  We recommend signing up for the patient portal called "MyChart".  Sign up information is provided on this After Visit Summary.  MyChart is used to connect with patients for Virtual Visits (Telemedicine).  Patients are able to view lab/test results, encounter notes, upcoming appointments, etc.  Non-urgent messages can be sent to your provider as well.   To learn more about what you can do with MyChart, go to [email or web address].    Your next appointment:   12 month(s)  The format for your next appointment:   In Person  Provider:   Kirk Ruths, MD

## 2021-03-16 ENCOUNTER — Ambulatory Visit: Payer: Managed Care, Other (non HMO) | Admitting: Obstetrics and Gynecology

## 2021-04-05 ENCOUNTER — Other Ambulatory Visit: Payer: Self-pay

## 2021-04-05 ENCOUNTER — Ambulatory Visit (HOSPITAL_COMMUNITY): Payer: Managed Care, Other (non HMO) | Attending: Internal Medicine

## 2021-04-05 DIAGNOSIS — I471 Supraventricular tachycardia: Secondary | ICD-10-CM | POA: Diagnosis not present

## 2021-04-05 LAB — ECHOCARDIOGRAM COMPLETE
Area-P 1/2: 2.09 cm2
S' Lateral: 2.7 cm

## 2021-04-13 ENCOUNTER — Ambulatory Visit
Admission: RE | Admit: 2021-04-13 | Discharge: 2021-04-13 | Disposition: A | Discharge status: Home / Self Care | Payer: Managed Care, Other (non HMO) | Source: Ambulatory Visit | Attending: Family Medicine | Admitting: Family Medicine

## 2021-04-13 ENCOUNTER — Other Ambulatory Visit: Payer: Self-pay

## 2021-04-13 DIAGNOSIS — Z1231 Encounter for screening mammogram for malignant neoplasm of breast: Secondary | ICD-10-CM

## 2021-05-07 ENCOUNTER — Other Ambulatory Visit: Payer: Self-pay | Admitting: Nurse Practitioner

## 2021-05-19 ENCOUNTER — Telehealth: Payer: Self-pay

## 2021-05-19 NOTE — Telephone Encounter (Signed)
Attempt made to contact Meghan Macdonald is a 60 y.o. female re: New pt Pre appt call to collect history information.  -Allergy -Medication -Confirm pharmacy -OB history   Pt was not available. Someone picked up and hung up 2x.  Not available to leave a VM.

## 2021-05-25 ENCOUNTER — Other Ambulatory Visit: Payer: Self-pay

## 2021-05-25 ENCOUNTER — Ambulatory Visit (INDEPENDENT_AMBULATORY_CARE_PROVIDER_SITE_OTHER): Payer: Managed Care, Other (non HMO) | Admitting: Obstetrics and Gynecology

## 2021-05-25 ENCOUNTER — Encounter: Payer: Self-pay | Admitting: Obstetrics and Gynecology

## 2021-05-25 VITALS — BP 128/79 | HR 65 | Ht 61.5 in

## 2021-05-25 DIAGNOSIS — N393 Stress incontinence (female) (male): Secondary | ICD-10-CM

## 2021-05-25 DIAGNOSIS — N3281 Overactive bladder: Secondary | ICD-10-CM

## 2021-05-25 DIAGNOSIS — R350 Frequency of micturition: Secondary | ICD-10-CM | POA: Diagnosis not present

## 2021-05-25 LAB — POCT URINALYSIS DIPSTICK
Appearance: ABNORMAL
Bilirubin, UA: NEGATIVE
Blood, UA: NEGATIVE
Glucose, UA: NEGATIVE
Ketones, UA: NEGATIVE
Leukocytes, UA: NEGATIVE
Nitrite, UA: NEGATIVE
Protein, UA: NEGATIVE
Spec Grav, UA: >=1.03 — AB (ref 1.010–1.025)
Urobilinogen, UA: 0.2 E.U./dL
pH, UA: 5 (ref 5.0–8.0)

## 2021-05-25 MED ORDER — MIRABEGRON ER 25 MG PO TB24: 25.0000 mg | ORAL_TABLET | Freq: Every day | ORAL | 0 refills | Status: DC

## 2021-05-25 NOTE — Patient Instructions (Addendum)
Today we talked about ways to manage bladder urgency such as altering your diet to avoid irritative beverages and foods (bladder diet) as well as attempting to decrease stress and other exacerbating factors.   The Most Bothersome Foods* The Least Bothersome Foods*  Coffee - Regular & Decaf Tea - caffeinated Carbonated beverages - cola, non-colas, diet & caffeine-free Alcohols - Beer, Red Wine, White Wine, Champagne Fruits - Grapefruit, [place], Orange, [employer] - Cranberry, Grapefruit, Orange, Pineapple Vegetables - Tomato & Tomato Products Flavor Enhancers - Hot peppers, Spicy foods, Chili, Horseradish, Vinegar, Monosodium glutamate (MSG) Artificial Sweeteners - NutraSweet, Sweet 'N Low, Equal (sweetener), Saccharin Ethnic foods - [place], [place], [place] food Express Scripts - low-fat & whole Fruits - Bananas, Blueberries, Honeydew melon, Pears, Raisins, Watermelon Vegetables - Broccoli, Brussels Sprouts, [place], Carrots, Cauliflower, Bracey, Cucumber, Mushrooms, Peas, Radishes, Squash, Zucchini, White potatoes, Sweet potatoes & yams Poultry - Chicken, Eggs, [place], [employer] - Beef, Programmer, multimedia, Lamb Seafood - Shrimp, Floral fish, Salmon Grains - Oat, Rice Snacks - Pretzels, Popcorn  *Lissa Morales et al. Diet and its role in interstitial cystitis/bladder pain syndrome (IC/BPS) and comorbid conditions. [place] 2012 Jan 11.   I have ordered Myrbetriq 25mg  daily for overactive bladder.

## 2021-05-25 NOTE — Progress Notes (Signed)
[name]  Referring Provider: Sloan Leiter, MD PCP: Robyne Peers, MD Date of Service: 05/25/2021  SUBJECTIVE Chief Complaint: New Patient (Initial Visit)  History of Present Illness: Meghan Macdonald is a 60 y.o. White or Caucasian female seen in consultation at the request of Dr. Rosana Hoes for evaluation of incontinence.    Review of records from Dr Rosana Hoes significant for: Has vulvar irritation from incontinence. Has tried medications in the past but had side effects.   Urinary Symptoms: Leaks urine with cough/ sneeze, laughing, exercise, lifting, going from sitting to standing, with a full bladder, with movement to the bathroom, and with urgency Leaks 1-3 time(s) per days.  Pad use: 1-3 pads per day.   She is bothered by her UI symptoms.  Has tried 2 other medications (toviaz, detrol) and pelvic PT (only a few sessions).   Day time voids 7-8.  Nocturia: 2-3 times per night to void. Voiding dysfunction: she empties her bladder well.  does not use a catheter to empty bladder.  When urinating, she feels a weak stream and dribbling after finishing Drinks: mostly water, some times tea or an energy drink  UTIs: 1 UTI's in the last year.   Denies history of kidney or bladder stones  Pelvic Organ Prolapse Symptoms:                  She denies to a feeling of a bulge the vaginal area. Was told she has some prolapse. .  She Denies seeing a bulge.  This bulge is not bothersome.  Bowel Symptom: Bowel movements: several time(s) per day Stool consistency: soft  Straining: no.  Splinting: no.  Incomplete evacuation: no.  She Admits to accidental bowel leakage / fecal incontinence  Occurs: rarely  Consistency with leakage: liquid Bowel regimen:  align probiotic Last colonoscopy: Date 3 years ago, Results- benign polyp removed  Sexual Function Sexually active: no.   Pelvic Pain Denies pelvic pain   Past Medical History:   Past Medical History:  Diagnosis Date   Anxiety    Arthritis    knees   Asthma    exertion or exteme cold.   Back pain    Carcinoid tumor of appendix    Complication of anesthesia    SOB after second surgery 4 days after gastric bypass   Depression    Dysrhythmia    idiopathic arthy; had plapitations since a child. On metoprolol for 9 years;   Essential hypertension, benign    GERD (gastroesophageal reflux disease)    Headache    History of kidney stones    History of migraine headaches    IBS (irritable bowel syndrome)    Insomnia    Iron deficiency anemia    Vitamin B deficiency      Past Surgical History:   Past Surgical History:  Procedure Laterality Date   APPENDECTOMY  2001   carcinoid tumor, found during gastric bypass.   BIOPSY  08/12/2020   Procedure: BIOPSY;  Surgeon: Rush Landmark Telford Nab., MD;  [facility];  Service: Gastroenterology;;   BREAST BIOPSY Right    COLONOSCOPY WITH ESOPHAGOGASTRODUODENOSCOPY (EGD)  2008   Dr. Collene Mares: s/p right hemicolectomy, sigmoid colon diverticulosis, otherwise unremarkable. EGD unremarkable, s/p gastric bypass, No Barrett's   COLONOSCOPY WITH PROPOFOL N/A 11/10/2016   Procedure: COLONOSCOPY WITH PROPOFOL;  Surgeon: Daneil Dolin, MD;  [facility];  Service: Endoscopy;  Laterality: N/A;  115   CYSTOSCOPY  ENTEROSCOPY N/A 08/12/2020   Procedure: ENTEROSCOPY;  Surgeon: Mansouraty, Telford Nab., MD;  Location: [place];  Service: Gastroenterology;  Laterality: N/A;   ERCP  08/12/2020   ERCP N/A 08/12/2020   Procedure: ENDOSCOPIC RETROGRADE CHOLANGIOPANCREATOGRAPHY (ERCP);  Surgeon: Irving Copas., MD;  Location: [place];  Service: Gastroenterology;  Laterality: N/A;   ESOPHAGOGASTRODUODENOSCOPY (EGD) WITH PROPOFOL N/A 11/10/2016   Dr. Gala Romney: Because of changes found in the esophagus, as any weighted undulating Z line but no Barrett's, small bowel biopsy negative for celiac, Billroth II  configuration found. Esophagus dilated per history of dysphagia. Colonoscopy showed status post right hemicolectomy, scattered diverticula, 2 polyps removed from the rectum, one tubular adenoma. Next colonoscopy 5 years, January 2023   GASTRIC BYPASS  2001   incidental finding of carcinoid tumor of appendix at time of surgery with appendectomy   IR BILIARY DRAIN PLACEMENT WITH CHOLANGIOGRAM  08/11/2020   MALONEY DILATION N/A 11/10/2016   Procedure: Venia Minks DILATION;  Surgeon: Daneil Dolin, MD;  [facility];  Service: Endoscopy;  Laterality: N/A;   REPLACEMENT TOTAL KNEE Left 2006   Rhinologic surgery     RIGHT OOPHORECTOMY     removal of mesentary and part of colon and uterus with removal of appendix; 4 days after gastric bypass   SUBMUCOSAL TATTOO INJECTION  08/12/2020   Procedure: SUBMUCOSAL TATTOO INJECTION;  Surgeon: Irving Copas., MD;  Location: [place];  Service: Gastroenterology;;   TONSILLECTOMY AND ADENOIDECTOMY       Past OB/GYN History: G1 P1 Vaginal deliveries: 1,  Forceps/ Vacuum deliveries: 0, Cesarean section: 0 Menopausal: Yes, Denies vaginal bleeding since menopause Last pap smear was 01/2021- negative.     Medications: She has a current medication list which includes the following prescription(s): albuterol, cholecalciferol, duloxetine, duloxetine, fluticasone, mirabegron er, montelukast, bariatric multivitamins/iron, rabeprazole, vitamin b-12, zolpidem, and metoprolol succinate.   Allergies: Patient is allergic to azithromycin.   Social History:  Social History   Tobacco Use   Smoking status: Never   Smokeless tobacco: Never  Vaping Use   Vaping Use: Never used  Substance Use Topics   Alcohol use: Not Currently    Alcohol/week: 2.0 standard drinks    Types: 2 Glasses of wine per week    Comment: Regular alcohol intake, 5 days a week. Quit December 2017   Drug use: No     Family History:   Family History  Problem Relation Age of  Onset   Asthma Mother    Hyperlipidemia Mother    Thyroid disease Mother    Hyperlipidemia Father    Lung cancer Father    Colon cancer Maternal Grandmother 16   Breast cancer Neg Hx      Review of Systems: Review of Systems  Constitutional:  Negative for fever, malaise/fatigue and weight loss.  Respiratory:  Negative for cough, shortness of breath and wheezing.   Cardiovascular:  Positive for palpitations. Negative for chest pain and leg swelling.  Gastrointestinal:  Negative for abdominal pain and blood in stool.  Genitourinary:  Negative for dysuria.  Musculoskeletal:  Negative for myalgias.  Skin:  Negative for rash.  Neurological:  Negative for dizziness and headaches.  Endo/Heme/Allergies:  Does not bruise/bleed easily.       + hot flashes  Psychiatric/Behavioral:  Positive for depression. The patient is nervous/anxious.     OBJECTIVE Physical Exam: Vitals:   05/26/21 0841  BP: 128/79  Pulse: 65  Height: 5' 1.5" (1.562 m)    Physical Exam  Constitutional:      General: She is not in acute distress. Pulmonary:     Effort: Pulmonary effort is normal.  Abdominal:     General: There is no distension.     Palpations: Abdomen is soft.     Tenderness: There is no abdominal tenderness. There is no rebound.     Comments: obese  Musculoskeletal:        General: No swelling. Normal range of motion.  Skin:    General: Skin is warm and dry.     Findings: No rash.  Neurological:     Mental Status: She is alert and oriented to person, place, and time.  Psychiatric:        Mood and Affect: Mood normal.        Behavior: Behavior normal.     GU / Detailed Urogynecologic Evaluation:  Pelvic Exam: Normal external female genitalia; Bartholin's and Skene's glands normal in appearance; urethral meatus normal in appearance, no urethral masses or discharge.   CST: positive  Speculum exam reveals normal vaginal mucosa with atrophy. Cervix normal appearance. Uterus normal  single, nontender. Adnexa  not palpable .    Pelvic floor strength II/V  Pelvic floor musculature: Right levator non-tender, Right obturator non-tender, Left levator non-tender, Left obturator non-tender  POP-Q:   POP-Q  -3                                            Aa   -3                                           Ba  -7                                              C   2                                            Gh  4                                            Pb  9                                            tvl   -2                                            Ap  -2                                            Bp  -9  D     Rectal Exam:  Normal external rectum  Post-Void Residual (PVR) by Bladder Scan: In order to evaluate bladder emptying, we discussed obtaining a postvoid residual and she agreed to this procedure.  Procedure: The ultrasound unit was placed on the patient's abdomen in the suprapubic region after the patient had voided. A PVR of 204 ml was obtained by bladder scan.  In order to confirm this, a straight catheter was placed in the urethra after prepping with betadine. 32ml dark urine was obtained.   Laboratory Results: POC urine: negative  I visualized the urine specimen, noting the specimen to be dark yellow  ASSESSMENT AND PLAN Ms. Sabina is a 60 y.o. with:  1. Overactive bladder   2. Urinary frequency   3. SUI (stress urinary incontinence, female)     OAB We discussed the symptoms of overactive bladder (OAB), which include urinary urgency, urinary frequency, nocturia, with or without urge incontinence.  While we do not know the exact etiology of OAB, several treatment options exist. We discussed management including behavioral therapy (decreasing bladder irritants, urge suppression strategies, timed voids, bladder retraining), physical therapy, medication; for refractory cases posterior tibial nerve  stimulation, sacral neuromodulation, and intravesical botulinum toxin injection.  - Will start with Myrbetriq 25mg  daily. Has previously tried 2 anticholinergic medications. She is maybe interested in SNM if symptoms do not improve with medicaiton.   2. SUI For treatment of stress urinary incontinence,  non-surgical options include expectant management, weight loss, physical therapy, as well as a pessary and surgical options.  - She will attend physical therapy, referral placed.  - She may be interested in a pessary in the future.   Return 6 weeks for follow up   Jaquita Folds, MD   Medical Decision Making:  - Reviewed/ ordered a clinical laboratory test - Review and summation of prior records

## 2021-06-08 DIAGNOSIS — M1711 Unilateral primary osteoarthritis, right knee: Secondary | ICD-10-CM | POA: Insufficient documentation

## 2021-06-25 ENCOUNTER — Other Ambulatory Visit: Payer: Self-pay | Admitting: Obstetrics and Gynecology

## 2021-06-25 ENCOUNTER — Other Ambulatory Visit: Payer: Self-pay | Admitting: Cardiology

## 2021-06-25 DIAGNOSIS — I471 Supraventricular tachycardia: Secondary | ICD-10-CM

## 2021-06-25 DIAGNOSIS — N3281 Overactive bladder: Secondary | ICD-10-CM

## 2021-06-27 ENCOUNTER — Other Ambulatory Visit: Payer: Self-pay

## 2021-06-27 DIAGNOSIS — I471 Supraventricular tachycardia: Secondary | ICD-10-CM

## 2021-06-27 MED ORDER — METOPROLOL SUCCINATE ER 100 MG PO TB24: 100.0000 mg | ORAL_TABLET | Freq: Every day | ORAL | 0 refills | Status: DC

## 2021-06-30 ENCOUNTER — Encounter: Payer: Self-pay | Admitting: Internal Medicine

## 2021-07-06 ENCOUNTER — Ambulatory Visit: Payer: Managed Care, Other (non HMO) | Admitting: Obstetrics and Gynecology

## 2021-08-07 ENCOUNTER — Other Ambulatory Visit: Payer: Self-pay | Admitting: Gastroenterology

## 2021-08-08 NOTE — Telephone Encounter (Signed)
Patient needs an ov.

## 2021-08-09 ENCOUNTER — Encounter: Payer: Self-pay | Admitting: Internal Medicine

## 2021-09-02 IMAGING — MR MR MRCP
1 series · 3 of 3 positions shown · non-contrast
Comparison: CT scan 08/10/2020.

CLINICAL DATA: Cholelithiasis with biliary dilatation. Evaluate for
CBD stone.

EXAM:
MRI ABDOMEN WITHOUT CONTRAST  (INCLUDING MRCP)
TECHNIQUE: Multiplanar multisequence MR imaging of the abdomen was performed.
Heavily T2-weighted images of the biliary and pancreatic ducts were
obtained, and three-dimensional MRCP images were rendered by post
processing.

[Series 12: MRCP · 1.00mm/px · 3 of 3 slices shown]
[im 1/3]
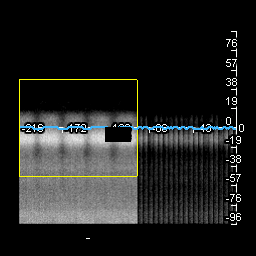
[im 2/3]
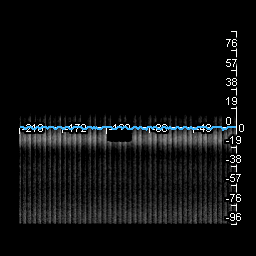
[im 3/3]
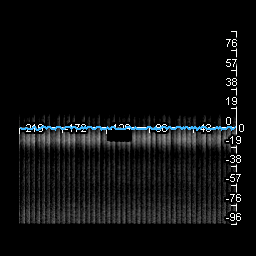

[3 of 3 positions shown; findings below may reference images not displayed]

FINDINGS: Lower chest: Dependent atelectasis bilaterally

Hepatobiliary: No suspicious focal abnormality within the liver
parenchyma. Mild intrahepatic biliary duct dilatation evident.
Gallbladder is distended with gallbladder wall irregularity and mild
pericholecystic edema. Multiple stones identified in the lumen of
the gallbladder measuring in the 2.0-2.5 cm size range. Common bile
duct dilated up to 13 mm diameter. 7 mm stone is identified in the
distal common bile duct, at the ampulla (well demonstrated on
coronal image 23 of series 5).

Pancreas: No focal mass lesion. No dilatation of the main duct. No
intraparenchymal cyst. No peripancreatic edema.

Spleen:  No splenomegaly. No focal mass lesion.

Adrenals/Urinary Tract: No adrenal nodule or mass. Kidneys
unremarkable.

Stomach/Bowel: Stomach is unremarkable. No gastric wall thickening.
No evidence of outlet obstruction. Duodenum is normally positioned
as is the ligament of Treitz. No small bowel or colonic dilatation
within the visualized abdomen.

Vascular/Lymphatic: No abdominal aortic aneurysm. There is no
gastrohepatic or hepatoduodenal ligament lymphadenopathy. No
retroperitoneal or mesenteric lymphadenopathy.

Other:  No intraperitoneal free fluid.

Musculoskeletal: No suspicious marrow signal abnormality within the
visualized bony anatomy.
IMPRESSION: 1. 7 mm stone identified in the distal common bile duct. Associated
intra and extrahepatic biliary duct dilatation with common bile duct
diameter measuring up to 13 mm.
2. Cholelithiasis with gallbladder wall irregularity and mild
pericholecystic edema. Acute cholecystitis not excluded.

## 2021-09-14 ENCOUNTER — Other Ambulatory Visit: Payer: Self-pay | Admitting: Cardiology

## 2021-09-14 DIAGNOSIS — I471 Supraventricular tachycardia: Secondary | ICD-10-CM

## 2021-11-22 ENCOUNTER — Encounter: Payer: Self-pay | Admitting: *Deleted

## 2022-03-28 DIAGNOSIS — K436 Other and unspecified ventral hernia with obstruction, without gangrene: Secondary | ICD-10-CM | POA: Insufficient documentation

## 2022-04-27 ENCOUNTER — Other Ambulatory Visit: Payer: Self-pay | Admitting: Family Medicine

## 2022-04-27 DIAGNOSIS — Z1231 Encounter for screening mammogram for malignant neoplasm of breast: Secondary | ICD-10-CM

## 2022-05-05 ENCOUNTER — Ambulatory Visit
Admission: RE | Admit: 2022-05-05 | Discharge: 2022-05-05 | Disposition: A | Discharge status: Home / Self Care | Payer: Managed Care, Other (non HMO) | Source: Ambulatory Visit

## 2022-05-05 DIAGNOSIS — Z1231 Encounter for screening mammogram for malignant neoplasm of breast: Secondary | ICD-10-CM

## 2022-05-10 ENCOUNTER — Ambulatory Visit: Payer: Managed Care, Other (non HMO) | Admitting: Adult Health

## 2022-05-15 ENCOUNTER — Other Ambulatory Visit: Payer: Self-pay | Admitting: Family Medicine

## 2022-05-15 DIAGNOSIS — N6310 Unspecified lump in the right breast, unspecified quadrant: Secondary | ICD-10-CM

## 2022-05-22 ENCOUNTER — Ambulatory Visit
Admission: RE | Admit: 2022-05-22 | Discharge: 2022-05-22 | Disposition: A | Discharge status: Home / Self Care | Payer: Managed Care, Other (non HMO) | Source: Ambulatory Visit | Attending: Family Medicine | Admitting: Family Medicine

## 2022-05-22 DIAGNOSIS — N6310 Unspecified lump in the right breast, unspecified quadrant: Secondary | ICD-10-CM

## 2023-03-28 DIAGNOSIS — S82831A Other fracture of upper and lower end of right fibula, initial encounter for closed fracture: Secondary | ICD-10-CM | POA: Insufficient documentation

## 2024-01-23 ENCOUNTER — Other Ambulatory Visit: Payer: Self-pay | Admitting: Family Medicine

## 2024-01-23 DIAGNOSIS — Z0000 Encounter for general adult medical examination without abnormal findings: Secondary | ICD-10-CM

## 2024-02-01 ENCOUNTER — Ambulatory Visit
Admission: RE | Admit: 2024-02-01 | Discharge: 2024-02-01 | Disposition: A | Discharge status: Home / Self Care | Source: Ambulatory Visit | Attending: Family Medicine | Admitting: Family Medicine

## 2024-02-01 DIAGNOSIS — Z0000 Encounter for general adult medical examination without abnormal findings: Secondary | ICD-10-CM

## 2024-02-07 ENCOUNTER — Encounter: Payer: Self-pay | Admitting: Family Medicine

## 2024-02-07 ENCOUNTER — Ambulatory Visit: Admitting: Family Medicine

## 2024-02-07 ENCOUNTER — Other Ambulatory Visit: Payer: Self-pay

## 2024-02-07 VITALS — BP 124/80 | HR 75 | Temp 98.5°F | Ht 61.5 in | Wt 273.2 lb

## 2024-02-07 DIAGNOSIS — I10 Essential (primary) hypertension: Secondary | ICD-10-CM | POA: Diagnosis not present

## 2024-02-07 DIAGNOSIS — G4709 Other insomnia: Secondary | ICD-10-CM

## 2024-02-07 DIAGNOSIS — K219 Gastro-esophageal reflux disease without esophagitis: Secondary | ICD-10-CM

## 2024-02-07 DIAGNOSIS — N8500 Endometrial hyperplasia, unspecified: Secondary | ICD-10-CM | POA: Insufficient documentation

## 2024-02-07 DIAGNOSIS — D508 Other iron deficiency anemias: Secondary | ICD-10-CM | POA: Diagnosis not present

## 2024-02-07 DIAGNOSIS — Z9884 Bariatric surgery status: Secondary | ICD-10-CM

## 2024-02-07 DIAGNOSIS — Z6841 Body Mass Index (BMI) 40.0 and over, adult: Secondary | ICD-10-CM

## 2024-02-07 DIAGNOSIS — Z23 Encounter for immunization: Secondary | ICD-10-CM

## 2024-02-07 DIAGNOSIS — I471 Supraventricular tachycardia, unspecified: Secondary | ICD-10-CM

## 2024-02-07 DIAGNOSIS — K589 Irritable bowel syndrome without diarrhea: Secondary | ICD-10-CM | POA: Insufficient documentation

## 2024-02-07 DIAGNOSIS — D3A020 Benign carcinoid tumor of the appendix: Secondary | ICD-10-CM | POA: Insufficient documentation

## 2024-02-07 DIAGNOSIS — Z800 Family history of malignant neoplasm of digestive organs: Secondary | ICD-10-CM | POA: Insufficient documentation

## 2024-02-07 DIAGNOSIS — L659 Nonscarring hair loss, unspecified: Secondary | ICD-10-CM

## 2024-02-07 MED ORDER — NYSTATIN-TRIAMCINOLONE 100000-0.1 UNIT/GM-% EX OINT: 1.0000 | TOPICAL_OINTMENT | Freq: Two times a day (BID) | CUTANEOUS | 1 refills | Status: AC

## 2024-02-07 MED ORDER — ESZOPICLONE 1 MG PO TABS: 1.0000 mg | ORAL_TABLET | Freq: Every day | ORAL | 0 refills | Status: DC

## 2024-02-07 MED ORDER — WEGOVY 2.4 MG/0.75ML ~~LOC~~ SOAJ: 2.4000 mg | SUBCUTANEOUS | 1 refills | Status: DC

## 2024-02-07 MED ORDER — WEGOVY 2.4 MG/0.75ML ~~LOC~~ SOAJ
2.4000 mg | SUBCUTANEOUS | 1 refills | Status: DC
Start: 2024-02-07 — End: 2024-02-07

## 2024-02-07 NOTE — Progress Notes (Signed)
 Patient Office Visit  Assessment & Plan:  Essential hypertension, benign  Gastroesophageal reflux disease, unspecified whether esophagitis present  History of bariatric surgery  Other iron deficiency anemia  Paroxysmal SVT (supraventricular tachycardia) (HCC)  Need for Tdap vaccination -     Tdap vaccine greater than or equal to 63yo IM  Hair loss -     Ambulatory referral to Dermatology  Morbid obesity with BMI of 50.0-59.9, adult (HCC) -     ZOXWRU; Inject 2.4 mg into the skin once a week.  Dispense: 3 mL; Refill: 1  Other insomnia -     Eszopiclone; Take 1 tablet (1 mg total) by mouth at bedtime.  Dispense: 90 tablet; Refill: 0  Other orders -     Nystatin-Triamcinolone; Apply 1 Application topically 2 (two) times daily.  Dispense: 30 g; Refill: 1   Recommend healthy diet i.e mediterranean/DASH diet, consistent exercise - 30 minutes 5 day per week, and gradual weight loss.  Is aware to get adequate protein intake given the fact that she had bariatric surgery and is currently on Wegovy.  Tdap updated today. Test results were reviewed and analyzed as part of the medical decision making of this visit.  Reviewed previous primary care notes from [facility], [facility] during the office visit.  Reviewed other labs 2 that were done last year.  Patient is to return in 3 months and we will repeat labs at that time. Return in about 3 months (around 05/08/2024), or if symptoms worsen or fail to improve.   Subjective:    Patient ID: Meghan Macdonald, female    DOB: [birth year]  Age: 63 y.o. MRN: [id number]  Chief Complaint  Patient presents with   Medical Management of Chronic Issues   Establish Care    HPI Patient is here to establish medical care with me and follow-up on chronic medical issues. HTN-using antihypertensive medication without difficulty.  Denies associated signs and symptoms including chest pain, shortness of breath, cough headache, peripheral  swelling cramps spasms and palpitations.  Voices understanding of the potential for interference with blood pressure control with substances including high sodium intake, decongestions, herbal supplements weight loss supplements nutritional supplements.  Blood pressures at home are less than 140/90.  Patient has reduced the Toprol-XL from 100 mg to 50 mg once a day.  Patient did see cardiology at [facility] and noted that the blood pressure was slightly low.  Patient was not having presyncope or syncope. Depression/anxiety/chronic pain-patient taking Cymbalta 60 mg once a day without any side effects or problems.  Patient thinks that it is helping her and not having any negative side effects.  Patient does have a previous history of having a positive ANA and daughter was being evaluated for lupus.  Has been under more stress due to caretaking of her mom. Palpitations/SVT-patient was taking metoprolol 100 mg but recently was reduced to 50 mg per cardiology because blood pressure was on the low side.  Patient not having chest pain shortness of breath or syncope or presyncope.  Has not noticed increasing in palpitations since reducing the metoprolol to 50 mg once a day. Morbid obesity-patient has been doing [place] since last year.  Patient is lost almost 100 pounds since being on the medication.  Patient has reduced the dosage.  No nausea vomiting constipation.  Patient has tolerated the medicine thus far.  Patient is trying to be consistent about her protein intake.  Has been eating healthier overall and has been  exercising. Vitamin B12 deficiency-patient has iron deficiency and B12 deficiency.  Patient has been eating healthier and also been taking vitamin B12 supplements.  Due to the previous history of bariatric surgery many years ago she is aware that she needs to take her vitamins consistently. GERD-patient takes Aciphex 20 mg once a day.  Patient does not take NSAIDs.  Patient not having any nausea  vomiting hematemesis medic easier. Chronic Insomnia-patient has been taking Lunesta milligram at nighttime.  Patient not having a.m. grogginess or cognitive deficits.  Patient is working full-time and is able to focus.  Is not able to sleep without the medication. Hair loss-patient this has been a chronic issue probably since bariatric surgery.  Patient has not seen dermatology regarding this.  Patient wondered about HRT improving the hair loss.  Patient does not like the increased risk however of breast cancer and/or uterine cancer.  Patient currently not having hot flashes. Health maintenance-patient is up-to-date on mammogram, colonoscopy, and Shingrix vaccines.  Patient needs a tetanus today. The ASCVD Risk score (Arnett DK, et al., 2019) failed to calculate for the following reasons:   Cannot find a previous HDL lab   Cannot find a previous total cholesterol lab  Past Medical History:  Diagnosis Date   Allergy 11/1968   Anxiety    Arthritis    knees   Asthma    exertion or exteme cold.   Back pain    Blood transfusion without reported diagnosis 2001   Cancer [facility]) 02/2000   Carcinoid tumor of appendix    Complication of anesthesia    SOB after second surgery 4 days after gastric bypass   COPD (chronic obstructive pulmonary disease) (HCC) 1987   Depression    Dysrhythmia    idiopathic arthy; had plapitations since a child. On metoprolol for 9 years;   Essential hypertension, benign    GERD (gastroesophageal reflux disease)    Headache    History of kidney stones    History of migraine headaches    IBS (irritable bowel syndrome)    Insomnia    Iron deficiency anemia    Ulcer 1985   Vitamin B deficiency    Past Surgical History:  Procedure Laterality Date   ABDOMINAL HYSTERECTOMY  2001   Partial   APPENDECTOMY  2001   carcinoid tumor, found during gastric bypass.   BIOPSY  08/12/2020   Procedure: BIOPSY;  Surgeon: Meridee Score Netty Starring., MD;  Location: [facility] ENDOSCOPY;   Service: Gastroenterology;;   BREAST BIOPSY Right    CHOLECYSTECTOMY  2021   COLON SURGERY  2001   COLONOSCOPY WITH ESOPHAGOGASTRODUODENOSCOPY (EGD)  2008   Dr. Loreta Ave: s/p right hemicolectomy, sigmoid colon diverticulosis, otherwise unremarkable. EGD unremarkable, s/p gastric bypass, No Barrett's   COLONOSCOPY WITH PROPOFOL N/A 11/10/2016   Procedure: COLONOSCOPY WITH PROPOFOL;  Surgeon: Corbin Ade, MD;  Location: [facility] END[facility];  Service: Endoscopy;  Laterality: N/A;  115   CYSTOSCOPY     ENTEROSCOPY N/A 08/12/2020   Procedure: ENTEROSCOPY;  Surgeon: Meridee Score Netty Starring., MD;  Location: [facility] ENDOSCOPY;  Service: Gastroenterology;  Laterality: N/A;   ERCP  08/12/2020   ERCP N/A 08/12/2020   Procedure: ENDOSCOPIC RETROGRADE CHOLANGIOPANCREATOGRAPHY (ERCP);  Surgeon: Lemar Lofty., MD;  Location: [facility] ENDOSCOPY;  Service: Gastroenterology;  Laterality: N/A;   ESOPHAGOGASTRODUODENOSCOPY (EGD) WITH PROPOFOL N/A 11/10/2016   Dr. Jena Gauss: Because of changes found in the esophagus, as any weighted undulating Z line but no Barrett's, small bowel biopsy negative for celiac, Billroth II  configuration found. Esophagus dilated per history of dysphagia. Colonoscopy showed status post right hemicolectomy, scattered diverticula, 2 polyps removed from the rectum, one tubular adenoma. Next colonoscopy 5 years, January 2023   GASTRIC BYPASS  2001   incidental finding of carcinoid tumor of appendix at time of surgery with appendectomy   HERNIA REPAIR  2023   IR BILIARY DRAIN PLACEMENT WITH CHOLANGIOGRAM  08/11/2020   JOINT REPLACEMENT  2006   MALONEY DILATION N/A 11/10/2016   Procedure: Elease Hashimoto DILATION;  Surgeon: Corbin Ade, MD;  Location: [facility];  Service: Endoscopy;  Laterality: N/A;   REPLACEMENT TOTAL KNEE Left 2006   Rhinologic surgery     RIGHT OOPHORECTOMY     removal of mesentary and part of colon and uterus with removal of appendix; 4 days after gastric bypass   SMALL INTESTINE  SURGERY  2001   SUBMUCOSAL TATTOO INJECTION  08/12/2020   Procedure: SUBMUCOSAL TATTOO INJECTION;  Surgeon: Lemar Lofty., MD;  Location: [facility] ENDOSCOPY;  Service: Gastroenterology;;   TONSILLECTOMY AND ADENOIDECTOMY     Social History   Tobacco Use   Smoking status: Never   Smokeless tobacco: Never  Vaping Use   Vaping status: Never Used  Substance Use Topics   Alcohol use: Not Currently    Alcohol/week: 30.0 standard drinks of alcohol    Types: 10 Glasses of wine, 20 Cans of beer per week    Comment: Regular alcohol intake, 5 days a week. Quit December 2017   Drug use: No   Family History  Problem Relation Age of Onset   Asthma Mother    Hyperlipidemia Mother    Thyroid disease Mother    Anxiety disorder Mother    COPD Mother    Depression Mother    Hearing loss Mother    Obesity Mother    Varicose Veins Mother    Hyperlipidemia Father    Lung cancer Father    Arthritis Father    Cancer Father    Colon cancer Maternal Grandmother 69   Cancer Maternal Grandmother    Obesity Maternal Grandmother    Varicose Veins Maternal Grandmother    Alcohol abuse Maternal Grandfather    Alcohol abuse Maternal Uncle    Anxiety disorder Daughter    Obesity Daughter    Obesity Sister    Breast cancer Neg Hx    Allergies  Allergen Reactions   Azithromycin Other (See Comments)    Upset stomach    ROS    Objective:    BP 124/80   Pulse 75   Temp 98.5 F (36.9 C)   Ht 5' 1.5" (1.562 m)   Wt 273 lb 4 oz (123.9 kg)   LMP 11/08/2014 Comment: no periods  SpO2 98%   BMI 50.79 kg/m  BP Readings from Last 3 Encounters:  02/07/24 124/80  05/26/21 128/79  03/09/21 132/81  Max weight-360 pounds-  Wt Readings from Last 3 Encounters:  02/07/24 273 lb 4 oz (123.9 kg)  03/09/21 (!) 317 lb 6.4 oz (144 kg)  01/27/21 (!) 314 lb (142.4 kg)    Physical Exam Vitals and nursing note reviewed.  Constitutional:      Appearance: Normal appearance.  HENT:     Head:  Normocephalic.     Right Ear: Tympanic membrane, ear canal and external ear normal.     Left Ear: Tympanic membrane, ear canal and external ear normal.  Eyes:     Extraocular Movements: Extraocular movements intact.     Conjunctiva/sclera:  Conjunctivae normal.     Pupils: Pupils are equal, round, and reactive to light.  Cardiovascular:     Rate and Rhythm: Normal rate and regular rhythm.     Heart sounds: Normal heart sounds.  Pulmonary:     Effort: Pulmonary effort is normal.     Breath sounds: Normal breath sounds.  Musculoskeletal:     Right lower leg: No edema.     Left lower leg: No edema.  Neurological:     General: No focal deficit present.     Mental Status: She is alert and oriented to person, place, and time.  Psychiatric:        Mood and Affect: Mood normal.        Behavior: Behavior normal.        Thought Content: Thought content normal.        Judgment: Judgment normal.      No results found for any visits on 02/07/24.

## 2024-02-11 ENCOUNTER — Ambulatory Visit: Payer: Managed Care, Other (non HMO) | Admitting: Family Medicine

## 2024-02-25 ENCOUNTER — Other Ambulatory Visit: Payer: Self-pay

## 2024-02-25 MED ORDER — RABEPRAZOLE SODIUM 20 MG PO TBEC: DELAYED_RELEASE_TABLET | ORAL | 1 refills | Status: DC

## 2024-02-27 ENCOUNTER — Other Ambulatory Visit: Payer: Self-pay

## 2024-02-27 ENCOUNTER — Ambulatory Visit
Admission: RE | Admit: 2024-02-27 | Discharge: 2024-02-27 | Disposition: A | Discharge status: Home / Self Care | Source: Ambulatory Visit | Attending: Family Medicine | Admitting: Family Medicine

## 2024-02-27 VITALS — BP 116/79 | HR 77 | Temp 98.1°F | Resp 16

## 2024-02-27 DIAGNOSIS — J0100 Acute maxillary sinusitis, unspecified: Secondary | ICD-10-CM | POA: Diagnosis not present

## 2024-02-27 MED ORDER — AMOXICILLIN-POT CLAVULANATE 875-125 MG PO TABS: 1.0000 | ORAL_TABLET | Freq: Two times a day (BID) | ORAL | 0 refills | Status: DC

## 2024-02-27 NOTE — ED Provider Notes (Signed)
 [facility]    CSN: [id number] Arrival date & time: 02/27/24  1737      History   Chief Complaint Chief Complaint  Patient presents with   Headache    HPI Meghan Macdonald is a 63 y.o. female.   HPI Very pleasant 63 year old female presents with headache, sinus nasal congestion, and sinus pressure for 3 to 4 days.  PMH significant for HTN, morbid obesity and cancer of appendix.  Past Medical History:  Diagnosis Date   Allergy 11/1968   Anxiety    Arthritis    knees   Asthma    exertion or exteme cold.   Back pain    Blood transfusion without reported diagnosis 2001   Cancer [facility]) 02/2000   Carcinoid tumor of appendix    Complication of anesthesia    SOB after second surgery 4 days after gastric bypass   COPD (chronic obstructive pulmonary disease) (HCC) 1987   Depression    Dysrhythmia    idiopathic arthy; had plapitations since a child. 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   Endometrial hyperplasia 02/07/2024   Family history of colon cancer 02/07/2024   Irritable bowel syndrome 02/07/2024   Incarcerated ventral hernia 03/28/2022   Unilateral primary osteoarthritis, right knee 06/08/2021   Morbid obesity with BMI of 50.0-59.9, adult (HCC) 11/07/2020   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   Choledocholithiasis 08/11/2020   Morton neuroma, left 09/05/2019   GERD (gastroesophageal reflux disease)    Lung nodule 06/10/2018   Seasonal allergies 02/06/2018   History of bariatric surgery 02/19/2017   Pain in both knees 02/19/2017   Hx of adenomatous colonic polyps 12/11/2016   Esophageal dysphagia 10/11/2016    Barrett's esophagus 10/11/2016   Colon cancer screening 10/11/2016   Fatigue 09/08/2016   Mass of breast, right 04/16/2015   Vitamin D deficiency 07/06/2014   Insomnia 05/06/2014   Paroxysmal SVT (supraventricular tachycardia) (HCC) 05/06/2014   Palpitations 10/06/2013   Essential hypertension, benign 10/06/2013   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   BIOPSY  08/12/2020   Procedure: BIOPSY;  Surgeon: Brice Campi Albino Alu., MD;  Location: [facility] ENDOSCOPY;  Service: Gastroenterology;;   BREAST BIOPSY Right    CHOLECYSTECTOMY  2021   COLON SURGERY  2001   COLONOSCOPY WITH ESOPHAGOGASTRODUODENOSCOPY (EGD)  2008   Dr. Tova Fresh: s/p right hemicolectomy, sigmoid colon diverticulosis, otherwise unremarkable. EGD unremarkable, s/p gastric bypass, No Barrett's   COLONOSCOPY WITH PROPOFOL  N/A 11/10/2016   Procedure: COLONOSCOPY WITH PROPOFOL ;  Surgeon: Suzette Espy, MD;  Location: [facility];  Service: Endoscopy;  Laterality: N/A;  115   CYSTOSCOPY     ENTEROSCOPY N/A 08/12/2020   Procedure: ENTEROSCOPY;  Surgeon: Brice Campi Albino Alu., MD;  Location: [facility] ENDOSCOPY;  Service: Gastroenterology;  Laterality: N/A;   ERCP  08/12/2020   ERCP N/A 08/12/2020   Procedure: ENDOSCOPIC RETROGRADE CHOLANGIOPANCREATOGRAPHY (ERCP);  Surgeon: Normie Becton., MD;  Location: [facility] ENDOSCOPY;  Service: Gastroenterology;   Laterality: N/A;   ESOPHAGOGASTRODUODENOSCOPY (EGD) WITH PROPOFOL  N/A 11/10/2016   Dr. Riley Cheadle: Because of changes found in the esophagus, as any weighted undulating Z line but no Barrett's, small bowel biopsy negative for celiac, Billroth II configuration found. Esophagus dilated per history of dysphagia. Colonoscopy showed status post right hemicolectomy, scattered diverticula, 2 polyps removed from the rectum, one tubular adenoma. Next colonoscopy 5 years, January 2023   GASTRIC BYPASS  2001   incidental finding of carcinoid tumor of appendix at time of surgery with appendectomy   HERNIA REPAIR  2023   IR BILIARY DRAIN PLACEMENT WITH CHOLANGIOGRAM  08/11/2020   JOINT REPLACEMENT  2006   MALONEY DILATION N/A 11/10/2016   Procedure: Londa Rival DILATION;  Surgeon: Suzette Espy, MD;  Location[facility];  Service: Endoscopy;  Laterality: N/A;   REPLACEMENT TOTAL KNEE Left 2006   Rhinologic surgery     RIGHT OOPHORECTOMY     removal of mesentary and part of colon and uterus with removal of appendix; 4 days after gastric bypass   SMALL INTESTINE SURGERY  2001   SUBMUCOSAL TATTOO INJECTION  08/12/2020   Procedure: SUBMUCOSAL TATTOO INJECTION;  Surgeon: Normie Becton., MD;  Location: [facility] ENDOSCOPY;  Service: Gastroenterology;;   TONSILLECTOMY AND ADENOIDECTOMY      OB History     Gravida  1   Para  1   Term  1   Preterm      AB      Living  1      SAB      IAB      Ectopic      Multiple      Live Births  1            Home Medications    Prior to Admission medications   Medication Sig Start Date End Date Taking? Authorizing Provider  amoxicillin -clavulanate (AUGMENTIN ) 875-125 MG tablet Take 1 tablet by mouth every 12 (twelve) hours. 02/27/24  Yes Leonides Ramp, FNP  albuterol  (PROVENTIL  HFA;VENTOLIN  HFA) 108 (90 BASE) MCG/ACT inhaler Inhale 1-2 puffs into the lungs every 6 (six) hours as needed for wheezing or shortness of breath.    [provider]  cholecalciferol (VITAMIN D) 1000 units tablet Take 1,000 Units by mouth daily.    [provider]  DULoxetine (CYMBALTA) 60 MG capsule Take 60 mg by mouth daily. 05/14/17   [provider]  eszopiclone  (LUNESTA ) 1 MG TABS tablet Take 1 tablet (1 mg total) by mouth at bedtime. 02/07/24   Aguiar, Rafaela, MD  fluticasone (FLONASE) 50 MCG/ACT nasal spray Place 1 spray into both nostrils daily as needed for allergies or rhinitis.     [provider]  metoprolol  succinate (TOPROL -XL) 100 MG 24 hr tablet TAKE ONE TABLET BY MOUTH DAILY. TAKE WITH OR IMMEDIATELY FOLLOWING A MEAL Patient taking differently: Take 50 mg by mouth daily. 09/15/21   Lenise Quince, MD  montelukast (SINGULAIR) 10 MG tablet Take 1 tablet by mouth daily. 04/14/21 02/07/24  [provider]  Multiple Vitamins-Minerals (BARIATRIC MULTIVITAMINS/IRON)  CAPS Take 1 capsule by mouth in the morning, at noon, and at bedtime.    [provider]  nystatin -triamcinolone  ointment (MYCOLOG) Apply 1 Application topically 2 (two) times daily. 02/07/24   Amadeo June, MD  RABEprazole  (ACIPHEX ) 20 MG tablet TAKE 1 TABLET BY MOUTH ONCE DAILY BEFORE BREAKFAST 02/25/24   Amadeo June, MD  vitamin B-12 (CYANOCOBALAMIN) 50 MCG tablet Take 50 mcg by mouth daily.    [provider]  WEGOVY  2.4 MG/0.75ML SOAJ Inject 2.4 mg into the skin once a week. 02/07/24   Amadeo June, MD    Family History Family History  Problem Relation Age of Onset   Asthma Mother    Hyperlipidemia Mother    Thyroid disease Mother    Anxiety disorder Mother    COPD Mother    Depression Mother    Hearing loss Mother    Obesity Mother    Varicose Veins Mother    Hyperlipidemia Father    Lung cancer Father    Arthritis Father    Cancer Father    Colon cancer Maternal Grandmother 12   Cancer Maternal Grandmother    Obesity Maternal Grandmother    Varicose Veins Maternal Grandmother    Alcohol abuse Maternal  Grandfather    Alcohol abuse Maternal Uncle    Anxiety disorder Daughter    Obesity Daughter    Obesity Sister    Breast cancer Neg Hx     Social History Social History   Tobacco Use   Smoking status: Never   Smokeless tobacco: Never  Vaping Use   Vaping status: Never Used  Substance Use Topics   Alcohol use: Not Currently    Alcohol/week: 30.0 standard drinks of alcohol    Types: 10 Glasses of wine, 20 Cans of beer per week    Comment: Regular alcohol intake, 5 days a week. Quit December 2017   Drug use: No     Allergies   Azithromycin   Review of Systems Review of Systems  HENT:  Positive for congestion, sinus pressure and sinus pain.   Neurological:  Positive for headaches.  All other systems reviewed and are negative.    Physical Exam Triage Vital Signs ED Triage Vitals  Encounter Vitals Group     BP 02/27/24 1745 116/79     Systolic BP Percentile --      Diastolic BP Percentile --      Pulse Rate 02/27/24 1745 77     Resp 02/27/24 1745 16     Temp 02/27/24 1745 98.1 F (36.7 C)     Temp src --      SpO2 02/27/24 1745 95 %     Weight --      Height --      Head Circumference --      Peak Flow --      Pain Score 02/27/24 1748 8     Pain Loc --      Pain Education --      Exclude from Growth Chart --    No data found.  Updated Vital Signs BP 116/79   Pulse 77   Temp 98.1 F (36.7 C)   Resp 16   LMP 11/08/2014 Comment: no periods  SpO2 95%      Physical Exam Vitals and nursing note reviewed.  Constitutional:      Appearance: Normal appearance. She is obese.  HENT:     Head: Normocephalic and atraumatic.     Right Ear: Tympanic membrane, ear  canal and external ear normal.     Left Ear: Tympanic membrane, ear canal and external ear normal.     Mouth/Throat:     Mouth: Mucous membranes are moist.     Pharynx: Oropharynx is clear.  Eyes:     Extraocular Movements: Extraocular movements intact.     Conjunctiva/sclera: Conjunctivae  normal.     Pupils: Pupils are equal, round, and reactive to light.  Cardiovascular:     Rate and Rhythm: Normal rate and regular rhythm.     Pulses: Normal pulses.     Heart sounds: Normal heart sounds.  Pulmonary:     Effort: Pulmonary effort is normal.     Breath sounds: Normal breath sounds. No wheezing, rhonchi or rales.  Musculoskeletal:        General: Normal range of motion.     Cervical back: Normal range of motion and neck supple.  Skin:    General: Skin is warm and dry.  Neurological:     General: No focal deficit present.     Mental Status: She is alert and oriented to person, place, and time. Mental status is at baseline.  Psychiatric:        Mood and Affect: Mood normal.        Behavior: Behavior normal.      UC Treatments / Results  Labs (all labs ordered are listed, but only abnormal results are displayed) Labs Reviewed - No data to display  EKG   Radiology No results found.  Procedures Procedures (including critical care time)  Medications Ordered in UC Medications - No data to display  Initial Impression / Assessment and Plan / UC Course  I have reviewed the triage vital signs and the nursing notes.  Pertinent labs & imaging results that were available during my care of the patient were reviewed by me and considered in my medical decision making (see chart for details).     MDM: 1.  Subacute maxillary sinusitis-Augmentin  875/125 mg tablet: Take 1 tablet twice daily x 7 days. Advised patient to take medication as directed with food to completion.  Encouraged to increase daily water intake to 64 ounces per day while taking this medication.  Advised if symptoms worsen and/or unresolved please follow-up with your PCP or here for further evaluation.  Patient discharged home, hemodynamically stable. Final Clinical Impressions(s) / UC Diagnoses   Final diagnoses:  Subacute maxillary sinusitis     Discharge Instructions      Advised patient to take  medication as directed with food to completion.  Encouraged to increase daily water intake to 64 ounces per day while taking this medication.  Advised if symptoms worsen and/or unresolved please follow-up with your PCP or here for further evaluation.     ED Prescriptions     Medication Sig Dispense Auth. Provider   amoxicillin -clavulanate (AUGMENTIN ) 875-125 MG tablet Take 1 tablet by mouth every 12 (twelve) hours. 14 tablet Keylani Perlstein, FNP      PDMP not reviewed this encounter.   Leonides Ramp, FNP 02/27/24 Jerolyn Moore

## 2024-02-27 NOTE — ED Triage Notes (Signed)
 Has had headache since yesterday afternoon, along with neck pain, ears hurting, some sinus pressure. Took naproxen. Feels like she has a sinus infection. Took naproxen.

## 2024-02-27 NOTE — Discharge Instructions (Addendum)
 Advised patient to take medication as directed with food to completion.  Encouraged to increase daily water intake to 64 ounces per day while taking this medication.  Advised if symptoms worsen and/or unresolved please follow-up with your PCP or here for further evaluation.

## 2024-03-07 ENCOUNTER — Telehealth: Payer: Self-pay

## 2024-03-07 NOTE — Telephone Encounter (Signed)
 Fax sent to [employer], [place] form for Wegovy , continuation of therapy. Mjp,lpn

## 2024-04-02 ENCOUNTER — Encounter: Payer: Self-pay | Admitting: Family Medicine

## 2024-04-07 ENCOUNTER — Other Ambulatory Visit: Payer: Self-pay

## 2024-04-07 MED ORDER — DULOXETINE HCL 60 MG PO CPEP: 60.0000 mg | ORAL_CAPSULE | Freq: Every day | ORAL | 0 refills | Status: DC

## 2024-05-07 ENCOUNTER — Encounter: Payer: Self-pay | Admitting: Family Medicine

## 2024-05-07 ENCOUNTER — Ambulatory Visit: Admitting: Family Medicine

## 2024-05-07 VITALS — BP 120/78 | HR 60 | Temp 97.7°F | Ht 61.5 in | Wt 266.4 lb

## 2024-05-07 DIAGNOSIS — D508 Other iron deficiency anemias: Secondary | ICD-10-CM | POA: Diagnosis not present

## 2024-05-07 DIAGNOSIS — E559 Vitamin D deficiency, unspecified: Secondary | ICD-10-CM

## 2024-05-07 DIAGNOSIS — M542 Cervicalgia: Secondary | ICD-10-CM

## 2024-05-07 DIAGNOSIS — R202 Paresthesia of skin: Secondary | ICD-10-CM | POA: Diagnosis not present

## 2024-05-07 DIAGNOSIS — G4709 Other insomnia: Secondary | ICD-10-CM

## 2024-05-07 DIAGNOSIS — R200 Anesthesia of skin: Secondary | ICD-10-CM | POA: Diagnosis not present

## 2024-05-07 DIAGNOSIS — G5762 Lesion of plantar nerve, left lower limb: Secondary | ICD-10-CM | POA: Diagnosis not present

## 2024-05-07 DIAGNOSIS — K2100 Gastro-esophageal reflux disease with esophagitis, without bleeding: Secondary | ICD-10-CM

## 2024-05-07 DIAGNOSIS — F411 Generalized anxiety disorder: Secondary | ICD-10-CM

## 2024-05-07 MED ORDER — ESZOPICLONE 1 MG PO TABS
1.0000 mg | ORAL_TABLET | Freq: Every day | ORAL | 1 refills | Status: DC
Start: 2024-05-07 — End: 2024-09-09

## 2024-05-07 NOTE — Progress Notes (Unsigned)
 Patient Office Visit  Assessment & Plan:  Numbness and tingling of both upper extremities -     DG Cervical Spine Complete; Future -     CBC with Differential/Platelet -     Comprehensive metabolic panel with GFR -     TSH -     Vitamin B12  Other iron deficiency anemia -     Iron, TIBC and Ferritin Panel  Morton neuroma, left  Tingling of both feet  Anxiety state  Vitamin D deficiency -     VITAMIN D 25 Hydroxy (Vit-D Deficiency, Fractures)  Other insomnia -     Eszopiclone ; Take 1 tablet (1 mg total) by mouth at bedtime.  Dispense: 90 tablet; Refill: 1  Cervicalgia  Gastroesophageal reflux disease with esophagitis without hemorrhage   Assessment and Plan    Peripheral Neuropathy Numbness and tingling in extremities, more severe in left arm. Possible cervical spine issues or stress-related. No weakness or clumsiness. - Order cervical spine X-ray. - Order CBC, kidney function, B12, and iron levels. - Consider nerve studies if symptoms persist or worsen.  Gastroesophageal Reflux Disease (GERD) GERD symptoms controlled with medication. Barrett's esophagus stable. Occasional discomfort with certain foods. - Continue current GERD medication. - Consider endoscopy with next year's colonoscopy if symptoms persist.  Insomnia Lunesta  effective for insomnia. Uses relaxation techniques. - Refill Lunesta  prescription. - Encourage continued use of relaxation techniques.  General Health Maintenance Up to date on most vaccinations. Needs pneumonia vaccine. Managing diet for weight loss and flour intolerance. - Administer pneumonia vaccine. - Encourage healthy eating and weight management.          No follow-ups on file.   Subjective:    Patient ID: Meghan Macdonald, female    DOB: [birth year]  Age: 63 y.o. MRN: [id number]  Chief Complaint  Patient presents with   Medical Management of Chronic Issues   Numbness    Numbness/ tingling in feet and hands/arms.     HPI Discussed the use of AI scribe software for clinical note transcription with the patient, who gave verbal consent to proceed.  History of Present Illness        Meghan Macdonald is a 63 year old female who presents with numbness and tingling in her extremities.  She has been experiencing numbness and tingling in both hands and feet for over eight months.  Tingling is mostly the feet but numbness and tingling of upper arms. Initially, she thought it was positional, but now it occurs even when she is not lying on her left side. The symptoms are more pronounced in her left arm but also affect her right arm and right foot. She describes the sensation in her left foot as feeling like something is stuck to the sole when walking barefoot, though it is fine with shoes on.  She has a history of knee replacement on her left leg, which resulted in nerve damage. She also has a neuroma on her left foot, which is not currently bothering her. No weakness in her arms or legs and no increase in clumsiness. She experiences chronic neck pain, which has not been x-rayed in years.  Her mother is currently in hospice care, which has been a source of stress. Her mother has been in and out of the hospital since April due to internal bleeding and other complications, and she has been on hospice for about two weeks.  She has lost about 110 pounds and has been off Wegovy  due to insurance  issues. She is currently taking Cymbalta , which helps with her symptoms and emotional stress related to her mother's condition. She also takes Lunesta  for sleep, which she finds effective.  She has a history of Barrett's esophagus, but the last endoscopy showed no evidence of it. She experiences occasional pressure under her diaphragm when eating certain foods, which she attributes to possible scarring from a previous incarcerated hernia. Physical Exam CARDIOVASCULAR: Heart regular rate and rhythm. Results LABS Iron: Within  normal limits (2023)  DIAGNOSTIC Endoscopy: No evidence of Barrett's esophagus (2024) Assessment & Plan Peripheral Neuropathy Numbness and tingling in extremities, more severe in left arm. Possible cervical spine issues or stress-related. No weakness or clumsiness. - Order cervical spine X-ray. - Order CBC, kidney function, B12, and iron levels. - Consider nerve studies if symptoms persist or worsen.  Gastroesophageal Reflux Disease (GERD) GERD symptoms controlled with medication. Barrett's esophagus stable. Occasional discomfort with certain foods. - Continue current GERD medication. - Consider endoscopy with next year's colonoscopy if symptoms persist.  Insomnia Lunesta  effective for insomnia. Uses relaxation techniques. - Refill Lunesta  prescription. - Encourage continued use of relaxation techniques.  General Health Maintenance Up to date on most vaccinations. Needs pneumonia vaccine. Managing diet for weight loss and flour intolerance. - Administer pneumonia vaccine. - Encourage healthy eating and weight management.     The ASCVD Risk score (Arnett DK, et al., 2019) failed to calculate for the following reasons:   Cannot find a previous HDL lab   Cannot find a previous total cholesterol lab  Past Medical History:  Diagnosis Date   Allergy 11/1968   Anxiety    Arthritis 11/1988   knees   Asthma 1985   exertion or exteme cold.   Back pain    Blood transfusion without reported diagnosis 2001   Cancer [facility]) 02/2000   Carcinoid tumor of appendix    Complication of anesthesia    SOB after second surgery 4 days after gastric bypass   COPD (chronic obstructive pulmonary disease) (HCC) 1987   Depression 11/1983   Dysrhythmia    idiopathic arthy; had plapitations since a child. On metoprolol  for 9 years;   Essential hypertension, benign    GERD (gastroesophageal reflux disease) 1985   Headache    History of kidney stones    History of migraine headaches    IBS  (irritable bowel syndrome)    Insomnia    Iron deficiency anemia    Ulcer 1985   Vitamin B deficiency    Past Surgical History:  Procedure Laterality Date   ABDOMINAL HYSTERECTOMY  2001   Partial   APPENDECTOMY  2001   carcinoid tumor, found during gastric bypass.   BIOPSY  08/12/2020   Procedure: BIOPSY;  Surgeon: Wilhelmenia Aloha Raddle., MD;  Location: [facility] ENDOSCOPY;  Service: Gastroenterology;;   BREAST BIOPSY Right    CHOLECYSTECTOMY  2021   COLON SURGERY  2001   COLONOSCOPY WITH ESOPHAGOGASTRODUODENOSCOPY (EGD)  2008   Dr. Kristie: s/p right hemicolectomy, sigmoid colon diverticulosis, otherwise unremarkable. EGD unremarkable, s/p gastric bypass, No Barrett's   COLONOSCOPY WITH PROPOFOL  N/A 11/10/2016   Procedure: COLONOSCOPY WITH PROPOFOL ;  Surgeon: Lamar CHRISTELLA Hollingshead, MD;  Location: [facility];  Service: Endoscopy;  Laterality: N/A;  115   CYSTOSCOPY     ENTEROSCOPY N/A 08/12/2020   Procedure: ENTEROSCOPY;  Surgeon: Wilhelmenia Aloha Raddle., MD;  Location: [facility] ENDOSCOPY;  Service: Gastroenterology;  Laterality: N/A;   ERCP  08/12/2020   ERCP N/A 08/12/2020   Procedure: ENDOSCOPIC  RETROGRADE CHOLANGIOPANCREATOGRAPHY (ERCP);  Surgeon: Wilhelmenia Aloha Raddle., MD;  Location: [facility] ENDOSCOPY;  Service: Gastroenterology;  Laterality: N/A;   ESOPHAGOGASTRODUODENOSCOPY (EGD) WITH PROPOFOL  N/A 11/10/2016   Dr. Shaaron: Because of changes found in the esophagus, as any weighted undulating Z line but no Barrett's, small bowel biopsy negative for celiac, Billroth II configuration found. Esophagus dilated per history of dysphagia. Colonoscopy showed status post right hemicolectomy, scattered diverticula, 2 polyps removed from the rectum, one tubular adenoma. Next colonoscopy 5 years, January 2023   GASTRIC BYPASS  2001   incidental finding of carcinoid tumor of appendix at time of surgery with appendectomy   HERNIA REPAIR  2023   IR BILIARY DRAIN PLACEMENT WITH CHOLANGIOGRAM  08/11/2020   JOINT  REPLACEMENT  2006   MALONEY DILATION N/A 11/10/2016   Procedure: AGAPITO DILATION;  Surgeon: Lamar CHRISTELLA Shaaron, MD;  Location: [facility];  Service: Endoscopy;  Laterality: N/A;   REPLACEMENT TOTAL KNEE Left 2006   Rhinologic surgery     RIGHT OOPHORECTOMY     removal of mesentary and part of colon and uterus with removal of appendix; 4 days after gastric bypass   SMALL INTESTINE SURGERY  2001   SUBMUCOSAL TATTOO INJECTION  08/12/2020   Procedure: SUBMUCOSAL TATTOO INJECTION;  Surgeon: Wilhelmenia Aloha Raddle., MD;  Location: [facility] ENDOSCOPY;  Service: Gastroenterology;;   TONSILLECTOMY AND ADENOIDECTOMY     Social History   Tobacco Use   Smoking status: Never   Smokeless tobacco: Never  Vaping Use   Vaping status: Never Used  Substance Use Topics   Alcohol use: Not Currently    Alcohol/week: 30.0 standard drinks of alcohol    Types: 10 Glasses of wine, 20 Cans of beer per week    Comment: Regular alcohol intake, 5 days a week. Quit December 2017   Drug use: No   Family History  Problem Relation Age of Onset   Asthma Mother    Hyperlipidemia Mother    Thyroid disease Mother    Anxiety disorder Mother    COPD Mother    Depression Mother    Hearing loss Mother    Obesity Mother    Varicose Veins Mother    Hyperlipidemia Father    Lung cancer Father    Arthritis Father    Cancer Father    Colon cancer Maternal Grandmother 75   Cancer Maternal Grandmother    Obesity Maternal Grandmother    Varicose Veins Maternal Grandmother    Alcohol abuse Maternal Grandfather    Alcohol abuse Maternal Uncle    Anxiety disorder Daughter    Obesity Daughter    Obesity Sister    Breast cancer Neg Hx    Allergies  Allergen Reactions   Azithromycin Other (See Comments)    Upset stomach    ROS    Objective:    BP 120/78   Pulse 60   Temp 97.7 F (36.5 C)   Ht 5' 1.5 (1.562 m)   Wt 266 lb 6 oz (120.8 kg)   LMP 11/08/2014 Comment: no periods  SpO2 98%   BMI 49.52 kg/m  BP  Readings from Last 3 Encounters:  05/07/24 120/78  02/27/24 116/79  02/07/24 124/80  Lost 110 pounds Wt Readings from Last 3 Encounters:  05/07/24 266 lb 6 oz (120.8 kg)  02/07/24 273 lb 4 oz (123.9 kg)  03/09/21 (!) 317 lb 6.4 oz (144 kg)    Physical Exam Vitals and nursing note reviewed.  Constitutional:  Appearance: Normal appearance.  HENT:     Head: Normocephalic.     Right Ear: Tympanic membrane, ear canal and external ear normal.     Left Ear: Tympanic membrane, ear canal and external ear normal.  Eyes:     Extraocular Movements: Extraocular movements intact.     Pupils: Pupils are equal, round, and reactive to light.  Cardiovascular:     Rate and Rhythm: Normal rate and regular rhythm.     Heart sounds: Normal heart sounds.  Pulmonary:     Effort: Pulmonary effort is normal.     Breath sounds: Normal breath sounds.  Musculoskeletal:     Right lower leg: No edema.     Left lower leg: No edema.  Neurological:     General: No focal deficit present.     Mental Status: She is alert and oriented to person, place, and time.  Psychiatric:        Mood and Affect: Mood normal.        Behavior: Behavior normal.        Thought Content: Thought content normal.        Judgment: Judgment normal.      No results found for any visits on 05/07/24.  {Labs (Optional):23779}

## 2024-05-08 ENCOUNTER — Ambulatory Visit

## 2024-05-08 DIAGNOSIS — R202 Paresthesia of skin: Secondary | ICD-10-CM

## 2024-05-08 DIAGNOSIS — M542 Cervicalgia: Secondary | ICD-10-CM | POA: Diagnosis not present

## 2024-05-08 DIAGNOSIS — R200 Anesthesia of skin: Secondary | ICD-10-CM | POA: Diagnosis not present

## 2024-05-08 LAB — CBC WITH DIFFERENTIAL/PLATELET
Absolute Lymphocytes: 2200 {cells}/uL (ref 850–3900)
Absolute Monocytes: 577 {cells}/uL (ref 200–950)
Basophils Absolute: 47 {cells}/uL (ref 0–200)
Basophils Relative: 0.6 %
Eosinophils Absolute: 203 {cells}/uL (ref 15–500)
Eosinophils Relative: 2.6 %
HCT: 41.6 % (ref 35.0–45.0)
Hemoglobin: 13.6 g/dL (ref 11.7–15.5)
MCH: 31.4 pg (ref 27.0–33.0)
MCHC: 32.7 g/dL (ref 32.0–36.0)
MCV: 96.1 fL (ref 80.0–100.0)
MPV: 11.1 fL (ref 7.5–12.5)
Monocytes Relative: 7.4 %
Neutro Abs: 4774 {cells}/uL (ref 1500–7800)
Neutrophils Relative %: 61.2 %
Platelets: 323 10*3/uL (ref 140–400)
RBC: 4.33 10*6/uL (ref 3.80–5.10)
RDW: 11.9 % (ref 11.0–15.0)
Total Lymphocyte: 28.2 %
WBC: 7.8 10*3/uL (ref 3.8–10.8)

## 2024-05-08 LAB — COMPREHENSIVE METABOLIC PANEL WITH GFR
AG Ratio: 1.6 (calc) (ref 1.0–2.5)
ALT: 21 U/L (ref 6–29)
AST: 26 U/L (ref 10–35)
Albumin: 3.9 g/dL (ref 3.6–5.1)
Alkaline phosphatase (APISO): 215 U/L — ABNORMAL HIGH (ref 37–153)
BUN: 17 mg/dL (ref 7–25)
CO2: 26 mmol/L (ref 20–32)
Calcium: 9.9 mg/dL (ref 8.6–10.4)
Chloride: 105 mmol/L (ref 98–110)
Creat: 0.58 mg/dL (ref 0.50–1.05)
Globulin: 2.4 g/dL (ref 1.9–3.7)
Glucose, Bld: 96 mg/dL (ref 65–99)
Potassium: 3.8 mmol/L (ref 3.5–5.3)
Sodium: 140 mmol/L (ref 135–146)
Total Bilirubin: 0.9 mg/dL (ref 0.2–1.2)
Total Protein: 6.3 g/dL (ref 6.1–8.1)
eGFR: 102 mL/min/{1.73_m2} (ref >=60)

## 2024-05-08 LAB — VITAMIN B12: Vitamin B-12: 482 pg/mL (ref 200–1100)

## 2024-05-08 LAB — IRON,TIBC AND FERRITIN PANEL
%SAT: 29 % (ref 16–45)
Ferritin: 33 ng/mL (ref 16–288)
Iron: 112 ng/mL (ref 45–288)
TIBC: 390 ug/dL (ref 250–450)

## 2024-05-08 LAB — VITAMIN D 25 HYDROXY (VIT D DEFICIENCY, FRACTURES): Vit D, 25-Hydroxy: 30 ng/mL (ref 30–100)

## 2024-05-08 LAB — TSH: TSH: 2.36 m[IU]/L (ref 0.40–4.50)

## 2024-05-12 ENCOUNTER — Ambulatory Visit: Payer: Self-pay | Admitting: Family Medicine

## 2024-07-02 ENCOUNTER — Other Ambulatory Visit: Payer: Self-pay | Admitting: Family Medicine

## 2024-07-03 NOTE — Telephone Encounter (Signed)
 Requested Prescriptions  Pending Prescriptions Disp Refills   DULoxetine  (CYMBALTA ) 60 MG capsule [Pharmacy Med Name: DULoxetine  HCl 60 MG Oral Capsule Delayed Release Particles] 90 capsule 0    Sig: Take 1 capsule by mouth once daily     Psychiatry: Antidepressants - SNRI - duloxetine  Passed - 07/03/2024  2:40 PM      Passed - Cr in normal range and within 360 days    Creat  Date Value Ref Range Status  05/07/2024 0.58 0.50 - 1.05 mg/dL Final         Passed - eGFR is 30 or above and within 360 days    GFR calc Af Amer  Date Value Ref Range Status  10/12/2018 >60 >60 mL/min Final   GFR calc non Af Amer  Date Value Ref Range Status  08/13/2020 >60 >60 mL/min Final   eGFR  Date Value Ref Range Status  05/07/2024 102 > OR = 60 mL/min/1.60m2 Final         Passed - Completed PHQ-2 or PHQ-9 in the last 360 days      Passed - Last BP in normal range    BP Readings from Last 1 Encounters:  05/07/24 120/78         Passed - Valid encounter within last 6 months    Recent Outpatient Visits           1 month ago Numbness and tingling of both upper extremities   [facility] Aletha Bene, MD   4 months ago Essential hypertension, benign   [facility] Aletha Bene, MD

## 2024-07-04 NOTE — Progress Notes (Signed)
 Subjective:    Meghan Macdonald is a 63 y.o. (DOB [birth year]) female.     Patient presents with  . Hypertension     Meghan Macdonald presents for Cardiology follow up. Patient denies cardiac symptoms or events. B/P readings low without orthostatic symptoms. No palpitations since last visit. Weight down off the Wegovy . SVT on betablocker therapy.   12/2023- Patient denies cardiac symptoms or events since last visit. B/P readings low today. Patient loss weight on Wegovy . No palpitations or SVT. EKG today sinus bradycardia with low low voltage.   12/2022- Hypertension controlled this visit. Obesity on Wegovy . PSVT with rare palpitations.   06/2022- History of paroxysmal SVT on betablocker therapy. Recent small bowel obstruction that required exploratory laparotomy. Morbid obesity on Wegovy  for weight loss. History of bariatric surgery. Hypertension controlled this visit. Patient had recent increase palpitations post surgery but no atrial fibrillation reported. EKG today sinus rhythm with no abnormality. No history of sleep apnea. Echocardiogram 2022 normal LV systolic function with no valvular heart disease.  Echocardiogram 03/2021  1. Left ventricular ejection fraction, by estimation, is 65 to 70%. The left ventricle has normal function. The left ventricle has no regional wall motion abnormalities. Left ventricular diastolic parameters are consistent with Grade I diastolic  dysfunction (impaired relaxation).   2. Right ventricular systolic function is normal. The right ventricular size is normal.   3. The mitral valve is grossly normal. Trivial mitral valve regurgitation.   4. The aortic valve is tricuspid. Aortic valve regurgitation is not visualized.   5. Aortic dilatation noted. There is borderline dilatation of the ascending aorta, measuring 38 mm.   6. The inferior vena cava is normal in size with greater than 50% respiratory variability, suggesting right atrial pressure of 3 mmHg.       Reviewed  and updated this visit by provider: Tobacco  Allergies  Meds  Med Hx  OB Hx  SE Hx  Surg Hx  Fam Hx       Review of Systems  Constitutional:  Negative for activity change.  HENT:  Negative for congestion.   Respiratory:  Negative for apnea and wheezing.   Cardiovascular:  Negative for leg swelling.  Gastrointestinal: Negative.   Endocrine: Negative.   Genitourinary: Negative.   Musculoskeletal: Negative.   Neurological: Negative.        Objective:   Vitals:   07/04/24 1548  BP: 102/70  Patient Position: Sitting  Pulse: 83  Weight: 260 lb (117.9 kg)  SpO2: 95%  PainSc: 0-No pain  PainLoc: Chest    Physical Exam Constitutional:      Appearance: She is obese.  HENT:     Head: Atraumatic.     Nose: No congestion.     Mouth/Throat:     Pharynx: Oropharynx is clear.  Eyes:     Pupils: Pupils are equal, round, and reactive to light.  Cardiovascular:     Rate and Rhythm: Normal rate and regular rhythm.     Heart sounds: No murmur heard. Musculoskeletal:        General: Normal range of motion.     Cervical back: Normal range of motion.  Pulmonary:     Effort: No respiratory distress.     Breath sounds: No wheezing or rales.  Abdominal:     General: There is no distension.     Tenderness: There is no abdominal tenderness.  Skin:    General: Skin is warm.  Neurological:     General: No focal deficit present.  Mental Status: She is alert and oriented to person, place, and time.     Cranial Nerves: No cranial nerve deficit.     Motor: No weakness.     Gait: Gait normal.  ECG 12 lead  Result Date: 06/09/2022 Sinus  Rhythm Low voltage in precordial leads.  -Anterior infarct -age undetermined.   ECG 12 lead  Result Date: 03/28/2022 Diagnosis Class Abnormal Acquisition Device D3K Ventricular Rate 95 Atrial Rate 95 P-R Interval 182 QRS Duration 90 Q-T Interval 354 QTC Calculation(Bazett) 444 Calculated P Axis 51 Calculated R Axis -16 Calculated T Axis 57  Diagnosis Normal sinus rhythm Low voltage QRS Cannot rule out Anterior infarct , age undetermined Abnormal ECG No previous ECGs available Manuelita Nice ([id number]) on 03/28/2022 11:28:39 PM certifies that he/she has reviewed the ECG tracing and confirms the  independent interpretation is correct.       Assessment / Plan:   Assessment 1. Essential (primary) hypertension (Primary) 2. Paroxysmal SVT (supraventricular tachycardia) (*) 3. Morbid obesity (*)     Plan  Refilled metoprolol  at same dose Return in 6 months  Risks, benefits, and alternatives of the medications and treatment plan prescribed today were discussed, and patient expressed understanding. Plan follow-up as discussed or as needed if any worsening symptoms or change in condition.

## 2024-08-10 ENCOUNTER — Other Ambulatory Visit: Payer: Self-pay | Admitting: Family Medicine

## 2024-09-04 ENCOUNTER — Other Ambulatory Visit: Payer: Self-pay | Admitting: Medical Genetics

## 2024-09-09 ENCOUNTER — Ambulatory Visit: Admitting: Family Medicine

## 2024-09-09 VITALS — BP 100/60 | HR 50 | Temp 98.4°F | Ht 61.5 in | Wt 259.0 lb

## 2024-09-09 DIAGNOSIS — J302 Other seasonal allergic rhinitis: Secondary | ICD-10-CM | POA: Diagnosis not present

## 2024-09-09 DIAGNOSIS — Z6841 Body Mass Index (BMI) 40.0 and over, adult: Secondary | ICD-10-CM

## 2024-09-09 DIAGNOSIS — I471 Supraventricular tachycardia, unspecified: Secondary | ICD-10-CM | POA: Diagnosis not present

## 2024-09-09 DIAGNOSIS — G4709 Other insomnia: Secondary | ICD-10-CM | POA: Diagnosis not present

## 2024-09-09 DIAGNOSIS — I10 Essential (primary) hypertension: Secondary | ICD-10-CM

## 2024-09-09 DIAGNOSIS — K5900 Constipation, unspecified: Secondary | ICD-10-CM

## 2024-09-09 DIAGNOSIS — D508 Other iron deficiency anemias: Secondary | ICD-10-CM

## 2024-09-09 DIAGNOSIS — Z23 Encounter for immunization: Secondary | ICD-10-CM

## 2024-09-09 DIAGNOSIS — Z1212 Encounter for screening for malignant neoplasm of rectum: Secondary | ICD-10-CM

## 2024-09-09 DIAGNOSIS — Z1211 Encounter for screening for malignant neoplasm of colon: Secondary | ICD-10-CM

## 2024-09-09 DIAGNOSIS — D3A020 Benign carcinoid tumor of the appendix: Secondary | ICD-10-CM

## 2024-09-09 MED ORDER — ESZOPICLONE 2 MG PO TABS
2.0000 mg | ORAL_TABLET | Freq: Every evening | ORAL | 0 refills | Status: AC | PRN
Start: 2024-09-09 — End: ?

## 2024-09-09 NOTE — Progress Notes (Unsigned)
 Patient Office Visit  Assessment & Plan:  Other iron deficiency anemia  Paroxysmal SVT (supraventricular tachycardia) -     Lipid panel  Seasonal allergies  Other insomnia -     Eszopiclone ; Take 1 tablet (2 mg total) by mouth at bedtime as needed for sleep. Take immediately before bedtime  Dispense: 90 tablet; Refill: 0  BMI 45.0-49.9, adult (HCC)  Essential hypertension, benign  Constipation, unspecified constipation type  Need for pneumococcal 20-valent conjugate vaccination -     Pneumococcal conjugate vaccine 20-valent  Carcinoid tumor of appendix, unspecified whether malignant [facility]) -     Ambulatory referral to Gastroenterology  Screening for colorectal cancer -     Ambulatory referral to Gastroenterology   Assessment and Plan    Obesity with ongoing weight loss management Significant weight loss achieved. Engaged in structured program with dietary modifications. Constipation managed with Metamucil. Goal weight under 200 pounds per cardiologist. - Continue weight loss program with dietary modifications and intermittent fasting. - Use Metamucil for constipation. - Monitor weight and dietary intake.  Essential hypertension Blood pressure well-controlled. Cardiologist advised increased water intake. - Continue current antihypertensive regimen. - Increase water intake.  Supraventricular tachycardia, stable on beta blocker Heart rate well-managed on beta blocker. - Continue beta blocker regimen.  Insomnia Chronic insomnia with variable sleep quality. Lunesta  1 mg provides some benefit. Discussed increasing to 2 mg. - Increase Lunesta  to 2 mg at bedtime. - Monitor sleep quality and adjust treatment.  Iron deficiency anemia, history of No current symptoms. Previous iron infusions noted.  History of benign carcinoid tumor of the appendix No current symptoms. Previous management with Octrio scans. - Refer to gastroenterology for evaluation and  management.  Postmenopausal state Confirmed postmenopausal status. No current symptoms.  Cataract Diagnosed with cataracts affecting vision. Scheduled for consult. - Proceed with ophthalmology consult.  General Health Maintenance Pneumonia vaccination recommended. Cholesterol levels not recently checked. - Administer pneumonia vaccination. - Order cholesterol panel.      Return in about 4 months (around 01/07/2025), or if symptoms worsen or fail to improve.   Subjective:    Patient ID: Meghan Macdonald, female    DOB: 21-Aug-1961  Age: 63 y.o. MRN: [id number]  Chief Complaint  Patient presents with   Medical Management of Chronic Issues    HPI Discussed the use of AI scribe software for clinical note transcription with the patient, who gave verbal consent to proceed.  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 Patient is still struggling with insomnia  She has experienced significant weight loss, losing over 100 pounds from a previous weight of over 360 pounds. This is attributed to dietary and lifestyle changes, including participation in a weight loss program at [facility]: [facility], which involves a very low-fat diet, intermittent fasting, and amino drops. She has been practicing intermittent fasting for over a year and a half, with an eating window from 11 AM to 7 PM. She reports increased stamina and describes engaging in resistance training and walking. Her goal is to reach a weight of 145 pounds, although she would be satisfied with being under 200 pounds.  Her mother passed away in June 11, 2024, which has been emotionally challenging but has allowed her to focus more on her health. She used part of her inheritance to join the weight loss program. The program  has helped her overcome chocolate cravings, and she is eating more  vegetables. She experiences occasional constipation, for which she uses Metamucil.  She is currently taking half a dose of metoprolol . No recent palpitations or heart racing. Her blood pressure has been low.  She did see cardiology at [facility] care and no medication changes were made other than taking the metoprolol  in half.   She has a history of carcinoid tumor/syndrome, discovered incidentally during gastric bypass surgery many years ago. She has not had symptoms and has not had a recent Octreoscan.  Patient has not had follow-up regarding this with gastroenterologist. Patient would like to be seen for this and believes she is also due for colonoscopy.  She reports sleep difficulties, using Lunesta  to aid sleep, but finds it only somewhat effective. She has tried other medications like Ambien and trazodone in the past without significant improvement.  Has not tried Belsomra and has not tried increasing the Lunesta  but would like to increase the Lunesta  to see if this will help her.  She mentions having cataracts and reports her vision has been problematic, particularly at night. She has been stressed about her vision for the past two years as her eye doctor has been unable to improve it significantly.  Physical Exam MEASUREMENTS: Height- 5'4.  Results DIAGNOSTIC Octreoscan: No evidence of carcinoid syndrome  Assessment and Plan Obesity with ongoing weight loss management Significant weight loss achieved. Engaged in structured program with dietary modifications. Constipation managed with Metamucil. Goal weight under 200 pounds per cardiologist. - Continue weight loss program with dietary modifications and intermittent fasting. - Use Metamucil for constipation. - Monitor weight and dietary intake.  Essential hypertension Blood pressure well-controlled. Cardiologist advised increased water intake. - Continue current antihypertensive regimen. - Increase water  intake.  Supraventricular tachycardia, stable on beta blocker Heart rate well-managed on beta blocker. - Continue beta blocker regimen.  Insomnia Chronic insomnia with variable sleep quality. Lunesta  1 mg provides some benefit. Discussed increasing to 2 mg. - Increase Lunesta  to 2 mg at bedtime. - Monitor sleep quality and adjust treatment.  Iron deficiency anemia, history of No current symptoms. Previous iron infusions noted.  History of benign carcinoid tumor of the appendix (patient not sure it was benign) No current symptoms. Previous management with Octretide scans. Patient has not had any follow up re this in a long time - Refer to gastroenterology for evaluation and management.  Postmenopausal state Confirmed postmenopausal status. No current symptoms.  Cataract Diagnosed with cataracts affecting vision. Scheduled for consult. - Proceed with ophthalmology consult.  General Health Maintenance Pneumonia vaccination recommended. Cholesterol levels not recently checked. - Administer pneumonia vaccination. - Order cholesterol panel.   The 10-year ASCVD risk score (Arnett DK, et al., 2019) is: 2.9%   Values used to calculate the score:     Age: 24 years     Clincally relevant sex: Female     Is Non-Hispanic African American: No     Diabetic: No     Tobacco smoker: No     Systolic Blood Pressure: 100 mmHg     Is BP treated: Yes     HDL Cholesterol: 63 mg/dL     Total Cholesterol: 183 mg/dL  Past Medical History:  Diagnosis Date   Allergy 11/1968   Anxiety    Arthritis 11/1988   knees   Asthma 1985   exertion or exteme cold.   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 On metoprolol  for 9 years;   Essential hypertension, benign    GERD (gastroesophageal reflux disease) 1985   Headache    History of kidney stones    History of migraine headaches    IBS (irritable bowel syndrome)    Insomnia    Iron deficiency anemia    Ulcer 1985   Vitamin B deficiency    Past Surgical History:  Procedure Laterality Date   ABDOMINAL HYSTERECTOMY  2001   Partial   APPENDECTOMY  2001   carcinoid tumor, found during gastric bypass.   BIOPSY  08/12/2020   Procedure: BIOPSY;  Surgeon: Wilhelmenia Aloha Raddle., MD;  Location: [facility] ENDOSCOPY;  Service: Gastroenterology;;   BREAST BIOPSY Right    CHOLECYSTECTOMY  2021   COLON SURGERY  2001   COLONOSCOPY WITH ESOPHAGOGASTRODUODENOSCOPY (EGD)  2008   Dr. Kristie: s/p right hemicolectomy, sigmoid colon diverticulosis, otherwise unremarkable. EGD unremarkable, s/p gastric bypass, No Barrett's   COLONOSCOPY WITH PROPOFOL  N/A 11/10/2016   Procedure: COLONOSCOPY WITH PROPOFOL ;  Surgeon: Lamar CHRISTELLA Hollingshead, MD;  Location: [facility] END[facility];  Service: Endoscopy;  Laterality: N/A;  115   CYSTOSCOPY     ENTEROSCOPY N/A 08/12/2020   Procedure: ENTEROSCOPY;  Surgeon: Wilhelmenia Aloha Raddle., MD;  Location: [facility] ENDOSCOPY;  Service: Gastroenterology;  Laterality: N/A;   ERCP  08/12/2020   ERCP N/A 08/12/2020   Procedure: ENDOSCOPIC RETROGRADE CHOLANGIOPANCREATOGRAPHY (ERCP);  Surgeon: Wilhelmenia Aloha Raddle., MD;  Location: [facility] ENDOSCOPY;  Service: Gastroenterology;  Laterality: N/A;   ESOPHAGOGASTRODUODENOSCOPY (EGD) WITH PROPOFOL  N/A 11/10/2016   Dr. 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 Esophagus dilated per history of dysphagia. Colonoscopy showed status post right hemicolectomy, scattered diverticula, 2 polyps removed from the rectum, one tubular adenoma. Next colonoscopy 5 years,  January 2023   GASTRIC BYPASS  2001   incidental finding of carcinoid tumor of appendix at time of surgery with appendectomy   HERNIA REPAIR  2023   IR BILIARY DRAIN PLACEMENT WITH CHOLANGIOGRAM  08/11/2020   JOINT REPLACEMENT  2006   MALONEY DILATION N/A 11/10/2016   Procedure: AGAPITO DILATION;  Surgeon: Lamar CHRISTELLA Hollingshead, MD;  Location: [facility] ENDO SU[facility];  Service: Endoscopy;  Laterality: N/A;   REPLACEMENT TOTAL KNEE Left 2006   Rhinologic surgery     RIGHT OOPHORECTOMY     removal of mesentary and part of colon and uterus with removal of appendix; 4 days after gastric bypass   SMALL INTESTINE SURGERY  2001   SUBMUCOSAL TATTOO INJECTION  08/12/2020   Procedure: SUBMUCOSAL TATTOO INJECTION;  Surgeon: Wilhelmenia Aloha Raddle., MD;  Location: [facility] ENDOSCOPY;  Service: Gastroenterology;;   TONSILLECTOMY AND ADENOIDECTOMY     Social History   Tobacco Use   Smoking status: Never   Smokeless tobacco: Never  Vaping Use   Vaping status: Never Used  Substance Use Topics   Alcohol use: Not Currently    Alcohol/week: 30.0 standard drinks of alcohol    Types: 10 Glasses of wine, 20 Cans of beer per week    Comment: Regular alcohol intake, 5 days a week. Quit December  2017   Drug use: No   Family History  Problem Relation Age of Onset   Asthma Mother    Hyperlipidemia Mother    Thyroid disease Mother    Anxiety disorder Mother    COPD Mother    Depression Mother    Hearing loss Mother    Obesity Mother    Varicose Veins Mother    Hyperlipidemia Father    Lung cancer Father    Arthritis Father    Cancer Father    Obesity Sister    Colon cancer Maternal Grandmother 57   Cancer Maternal Grandmother    Obesity Maternal Grandmother    Varicose Veins Maternal Grandmother    Alcohol abuse Maternal Grandfather    Anxiety disorder Daughter    Obesity Daughter    Alcohol abuse Maternal Uncle    Breast cancer Neg Hx    Allergies  Allergen Reactions   Azithromycin Other (See Comments)     Upset stomach    ROS    Objective:    BP 100/60   Pulse (!) 50   Temp 98.4 F (36.9 C)   Ht 5' 1.5 (1.562 m)   Wt 259 lb (117.5 kg)   LMP 11/08/2014 Comment: no periods  SpO2 98%   BMI 48.15 kg/m  BP Readings from Last 3 Encounters:  09/09/24 100/60  05/07/24 120/78  02/27/24 116/79   Wt Readings from Last 3 Encounters:  09/09/24 259 lb (117.5 kg)  05/07/24 266 lb 6 oz (120.8 kg)  02/07/24 273 lb 4 oz (123.9 kg)    Physical Exam Vitals and nursing note reviewed.  Constitutional:      Appearance: Normal appearance.  HENT:     Head: Normocephalic.     Right Ear: Tympanic membrane, ear canal and external ear normal.     Left Ear: Tympanic membrane, ear canal and external ear normal.  Eyes:     Extraocular Movements: Extraocular movements intact.     Conjunctiva/sclera: Conjunctivae normal.     Pupils: Pupils are equal, round, and reactive to light.  Cardiovascular:     Rate and Rhythm: Normal rate and regular rhythm.     Heart sounds: Normal heart sounds.  Pulmonary:     Effort: Pulmonary effort is normal.     Breath sounds: Normal breath sounds.  Musculoskeletal:     Right lower leg: No edema.     Left lower leg: No edema.  Neurological:     General: No focal deficit present.     Mental Status: She is alert and oriented to person, place, and time.  Psychiatric:        Mood and Affect: Mood normal.        Behavior: Behavior normal.        Thought Content: Thought content normal.        Judgment: Judgment normal.      Results for orders placed or performed in visit on 09/09/24  Lipid panel  Result Value Ref Range   Cholesterol 183 <200 mg/dL   HDL 63 > OR = 50 mg/dL   Triglycerides 827 (H) <150 mg/dL   LDL Cholesterol (Calc) 93 mg/dL (calc)   Total CHOL/HDL Ratio 2.9 <5.0 (calc)   Non-HDL Cholesterol (Calc) 120 <130 mg/dL (calc)

## 2024-09-10 ENCOUNTER — Ambulatory Visit: Payer: Self-pay | Admitting: Family Medicine

## 2024-09-10 LAB — LIPID PANEL
Cholesterol: 183 mg/dL (ref <200)
HDL: 63 mg/dL (ref >=50)
LDL Cholesterol (Calc): 93 mg/dL
Non-HDL Cholesterol (Calc): 120 mg/dL (ref <130)
Total CHOL/HDL Ratio: 2.9 (calc) (ref <5.0)
Triglycerides: 172 mg/dL — ABNORMAL HIGH (ref <150)

## 2024-09-27 ENCOUNTER — Other Ambulatory Visit: Payer: Self-pay | Admitting: Family Medicine

## 2024-09-29 NOTE — Telephone Encounter (Signed)
 Requested Prescriptions  Pending Prescriptions Disp Refills   DULoxetine  (CYMBALTA ) 60 MG capsule [Pharmacy Med Name: DULoxetine  HCl 60 MG Oral Capsule Delayed Release Particles] 90 capsule 1    Sig: Take 1 capsule by mouth once daily     Psychiatry: Antidepressants - SNRI - duloxetine  Passed - 09/29/2024 12:39 PM      Passed - Cr in normal range and within 360 days    Creat  Date Value Ref Range Status  05/07/2024 0.58 0.50 - 1.05 mg/dL Final         Passed - eGFR is 30 or above and within 360 days    GFR calc Af Amer  Date Value Ref Range Status  10/12/2018 >60 >60 mL/min Final   GFR calc non Af Amer  Date Value Ref Range Status  08/13/2020 >60 >60 mL/min Final   eGFR  Date Value Ref Range Status  05/07/2024 102 > OR = 60 mL/min/1.19m2 Final         Passed - Completed PHQ-2 or PHQ-9 in the last 360 days      Passed - Last BP in normal range    BP Readings from Last 1 Encounters:  09/09/24 100/60         Passed - Valid encounter within last 6 months    Recent Outpatient Visits           2 weeks ago Other iron deficiency anemia   [place] [facility] Aletha Bene, MD   4 months ago Numbness and tingling of both upper extremities   [place] [facility] Aletha Bene, MD   7 months ago Essential hypertension, benign   [place] [facility] Aletha Bene, MD

## 2024-10-06 ENCOUNTER — Encounter: Payer: Self-pay | Admitting: Family Medicine

## 2024-10-23 ENCOUNTER — Encounter: Payer: Self-pay | Admitting: Family Medicine

## 2024-10-23 ENCOUNTER — Other Ambulatory Visit: Payer: Self-pay

## 2024-11-13 ENCOUNTER — Other Ambulatory Visit: Payer: Self-pay | Admitting: Family Medicine

## 2024-11-25 ENCOUNTER — Ambulatory Visit (INDEPENDENT_AMBULATORY_CARE_PROVIDER_SITE_OTHER): Admitting: Family Medicine

## 2024-11-25 ENCOUNTER — Encounter: Payer: Self-pay | Admitting: Family Medicine

## 2024-11-25 VITALS — BP 122/62 | HR 60 | Ht 61.5 in | Wt 240.0 lb

## 2024-11-25 DIAGNOSIS — R102 Pelvic and perineal pain unspecified side: Secondary | ICD-10-CM | POA: Diagnosis not present

## 2024-11-25 DIAGNOSIS — N3941 Urge incontinence: Secondary | ICD-10-CM

## 2024-11-25 NOTE — Progress Notes (Unsigned)
 "  Patient Office Visit  Assessment & Plan:  Urge incontinence of urine  Pelvic pressure in female   Assessment and Plan     Cystocele with urge urinary incontinence Firmness on the left side likely due to cystocele. Urinary urgency and incontinence persist despite previous usage of Myrbetriq . Prefers non-pharmacological management. - Referred to [facility] for evaluation and management. - Discussed potential need for pessary fitting or surgical intervention. - Patient declined starting medication for incontinence at this time   No follow-ups on file.   Subjective:    Patient ID: Meghan Macdonald, female    DOB: Mar 15, 1961  Age: 64 y.o. MRN: [id number]  Chief Complaint  Patient presents with   Vaginal Prolapse    Pt states she describes it as sitting on a balloon. She is concerned about a vaginal prolapse. Pt noticed it over the last week.     HPI Discussed the use of AI scribe software for clinical note transcription with the patient, who gave verbal consent to proceed.     History of Present Illness Meghan Macdonald is a 64 year old female who presents with urinary incontinence and pelvic pressure.  She experiences worsening urinary incontinence with increased urgency and difficulty controlling urination, especially when standing or the bathroom is nearby. She has tried using a pessary she bought on line, which did not fit well, and medications like Myrbetriq , which were ineffective and caused side effects.  Think she also tried Ditropan and Vesicare but not sure. She is having to buy pads due to urinary urgency/incontinence.   She reports a sensation of firmness on the left side, described as 'sitting on a balloon,' noticed last week while using the bathroom. This firmness is located between the vaginal opening and her leg, more noticeable when sitting, and is palpable but not painful.  She has a history of a partial hysterectomy due to cancer, where one ovary and  part of the right side of her uterus were left intact. Was told by OB that a chuck of her uterus was remved but not a complete hysterectomy. This was performed because her appendix had adhered to other organs, necessitating the removal of surrounding tissues to ensure no cancer cells were left behind. Patient was told it was only partial removal of uterus so that she could maintain her fertility.   She has a history of carcinoid tumor, did see GI at [facility] who ordered labs- with recent normal blood tests including tumor markers.  She has been actively losing weight, having lost over 100 pounds since her highest weight during the COVID-19 pandemic, attributed to dietary changes and increased physical activity following a gallbladder issue and subsequent hernia surgery.  Physical Exam GENITOURINARY: Vaginal area with some pressure, no pain. Vaginal area with some descent of bladder. No pelvic prolapse.  Results Labs Chemistry panel: Within normal limits except for elevated alkaline phosphatase Autoimmune panel: Within normal limits Hepatitis A serology: Within normal limits Hepatitis B serology: Within normal limits Hepatitis C serology: Within normal limits Tumor marker: Within normal limits  Assessment and Plan Cystocele with urge urinary incontinence Firmness on the left side likely due to cystocele. Urinary urgency and incontinence persist despite Myrbetriq . Prefers non-pharmacological management. - Referred to [facility] for evaluation and management. - Discussed potential need for pessary fitting or surgical intervention. - Patient declines starting medication.  Patient will let us  know if she needs a referral to gynecology in [place].  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 On metoprolol  for 9 years;   Essential hypertension, benign    GERD (gastroesophageal reflux disease) 1985   Headache    History of kidney stones    History of migraine headaches    IBS (irritable bowel syndrome)    Insomnia    Iron deficiency anemia    Ulcer 1985   Vitamin B deficiency    Past Surgical History:  Procedure Laterality Date   ABDOMINAL HYSTERECTOMY  2001   Partial   APPENDECTOMY  2001   carcinoid tumor, found during gastric bypass.   BIOPSY  08/12/2020   Procedure: BIOPSY;  Surgeon: Wilhelmenia Aloha Raddle., MD;  Location: [facility] ENDOSCOPY;  Service: Gastroenterology;;   BREAST BIOPSY Right    CHOLECYSTECTOMY  2021   COLON SURGERY  2001   COLONOSCOPY WITH ESOPHAGOGASTRODUODENOSCOPY (EGD)  2008   Dr. Kristie: s/p right hemicolectomy, sigmoid colon diverticulosis, otherwise unremarkable. EGD unremarkable, s/p gastric bypass, No Barrett's   COLONOSCOPY WITH PROPOFOL  N/A 11/10/2016   Procedure: COLONOSCOPY WITH PROPOFOL ;  Surgeon: Lamar CHRISTELLA Hollingshead, MD;  Location: [facility];  Service: Endoscopy;  Laterality: N/A;  115   CYSTOSCOPY     ENTEROSCOPY N/A 08/12/2020   Procedure: ENTEROSCOPY;  Surgeon: Wilhelmenia Aloha Raddle., MD;  Location: [facility] ENDOSCOPY;  Service: Gastroenterology;  Laterality: N/A;   ERCP  08/12/2020   ERCP N/A 08/12/2020   Procedure: ENDOSCOPIC RETROGRADE CHOLANGIOPANCREATOGRAPHY (ERCP);  Surgeon: Wilhelmenia Aloha Raddle., MD;  Location: [facility] END[facility]COPY;  Service: Gastroenterology;  Laterality: N/A;   ESOPHAGOGASTRODUODENOSCOPY (EGD) WITH PROPOFOL  N/A 11/10/2016   Dr. 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 Esophagus dilated per history of dysphagia. Colonoscopy showed status post right hemicolectomy, scattered diverticula, 2 polyps removed from the rectum, one tubular adenoma. Next colonoscopy 5 years, January 2023   GASTRIC BYPASS  2001   incidental finding of carcinoid tumor of appendix at time of surgery with appendectomy   HERNIA REPAIR  2023   IR BILIARY DRAIN PLACEMENT WITH CHOLANGIOGRAM  08/11/2020   JOINT REPLACEMENT  2006   MALONEY DILATION N/A 11/10/2016   Procedure: AGAPITO DILATION;  Surgeon: Lamar CHRISTELLA Hollingshead, MD;  Location: [facility];  Service: Endoscopy;  Laterality: N/A;   REPLACEMENT TOTAL KNEE Left 2006   Rhinologic surgery     RIGHT OOPHORECTOMY     removal of mesentary and part of colon and uterus with removal of appendix; 4 days after gastric bypass   SMALL INTESTINE SURGERY  2001   SUBMUCOSAL TATTOO INJECTION  08/12/2020   Procedure: SUBMUCOSAL TATTOO INJECTION;  Surgeon: Wilhelmenia Aloha Raddle., MD;  Location: [facility] ENDOSCOPY;  Service: Gastroenterology;;   TONSILLECTOMY AND ADENOIDECTOMY     Social History[1] Family History  Problem Relation Age of Onset   Asthma Mother    Hyperlipidemia  Mother    Thyroid disease Mother    Anxiety disorder Mother    COPD Mother    Depression Mother    Hearing loss Mother    Obesity Mother    Varicose Veins Mother    Hyperlipidemia Father    Lung cancer Father    Arthritis Father    Cancer Father    Obesity Sister    Colon cancer Maternal Grandmother 79   Cancer Maternal Grandmother    Obesity Maternal Grandmother    Varicose Veins Maternal Grandmother    Alcohol abuse Maternal Grandfather    Anxiety disorder Daughter    Obesity Daughter    Alcohol abuse Maternal Uncle    Breast cancer Neg Hx    Allergies[2]  ROS    Objective:    BP 122/62   Pulse 60   Ht 5' 1.5 (1.562 m)   Wt 240 lb (108.9 kg)   LMP  11/08/2014 Comment: no periods  SpO2 99%   BMI 44.61 kg/m  BP Readings from Last 3 Encounters:  11/25/24 122/62  09/09/24 100/60  05/07/24 120/78   Wt Readings from Last 3 Encounters:  11/25/24 240 lb (108.9 kg)  09/09/24 259 lb (117.5 kg)  05/07/24 266 lb 6 oz (120.8 kg)    Physical Exam Vitals and nursing note reviewed.  Constitutional:      General: She is not in acute distress.    Appearance: Normal appearance.  HENT:     Head: Normocephalic.  Eyes:     Extraocular Movements: Extraocular movements intact.     Conjunctiva/sclera: Conjunctivae normal.     Pupils: Pupils are equal, round, and reactive to light.  Cardiovascular:     Heart sounds: Normal heart sounds.  Pulmonary:     Effort: Pulmonary effort is normal.  Genitourinary:    Exam position: Supine.     Pubic Area: No rash.      Labia:        Right: No rash.        Left: No rash.      Urethra: No prolapse.     Vagina: Normal. No tenderness or bleeding.     Comments: Patient has cystocele present, no pelvic prolapse appreciated  Musculoskeletal:     Right lower leg: No edema.     Left lower leg: No edema.  Neurological:     General: No focal deficit present.     Mental Status: She is alert and oriented to person, place, and time.  Psychiatric:        Mood and Affect: Mood normal.        Behavior: Behavior normal.        Thought Content: Thought content normal.        Judgment: Judgment normal.      No results found for any visits on 11/25/24.           [1]  Social History Tobacco Use   Smoking status: Never   Smokeless tobacco: Never  Vaping Use   Vaping status: Never Used  Substance Use Topics   Alcohol use: Not Currently    Alcohol/week: 30.0 standard drinks of alcohol    Types: 10 Glasses of wine, 20 Cans of beer per week    Comment: Regular alcohol intake, 5 days a week. Quit December 2017   Drug use: No  [2]  Allergies Allergen Reactions   Azithromycin Other (See Comments)     Upset stomach   "

## 2025-02-10 ENCOUNTER — Ambulatory Visit: Admitting: Obstetrics and Gynecology

## 2025-03-09 ENCOUNTER — Ambulatory Visit: Admitting: Family Medicine
